# Patient Record
Sex: Male | Born: 1935 | Race: White | Hispanic: No | Marital: Married | Smoking: Former smoker
Health system: Southern US, Community
[De-identification: ages and names within clinical notes are randomized; demographics above are authoritative.]

## PROBLEM LIST (undated history)

## (undated) DIAGNOSIS — G629 Polyneuropathy, unspecified: Secondary | ICD-10-CM

## (undated) DIAGNOSIS — F419 Anxiety disorder, unspecified: Secondary | ICD-10-CM

## (undated) DIAGNOSIS — E1142 Type 2 diabetes mellitus with diabetic polyneuropathy: Secondary | ICD-10-CM

## (undated) DIAGNOSIS — I4819 Other persistent atrial fibrillation: Secondary | ICD-10-CM

## (undated) DIAGNOSIS — C649 Malignant neoplasm of unspecified kidney, except renal pelvis: Secondary | ICD-10-CM

## (undated) DIAGNOSIS — K222 Esophageal obstruction: Secondary | ICD-10-CM

## (undated) DIAGNOSIS — K219 Gastro-esophageal reflux disease without esophagitis: Secondary | ICD-10-CM

## (undated) DIAGNOSIS — E785 Hyperlipidemia, unspecified: Secondary | ICD-10-CM

## (undated) DIAGNOSIS — J449 Chronic obstructive pulmonary disease, unspecified: Secondary | ICD-10-CM

## (undated) DIAGNOSIS — F1020 Alcohol dependence, uncomplicated: Secondary | ICD-10-CM

## (undated) DIAGNOSIS — N2889 Other specified disorders of kidney and ureter: Secondary | ICD-10-CM

## (undated) DIAGNOSIS — C159 Malignant neoplasm of esophagus, unspecified: Secondary | ICD-10-CM

## (undated) DIAGNOSIS — H3530 Unspecified macular degeneration: Secondary | ICD-10-CM

## (undated) DIAGNOSIS — R0600 Dyspnea, unspecified: Secondary | ICD-10-CM

## (undated) DIAGNOSIS — F0390 Unspecified dementia without behavioral disturbance: Secondary | ICD-10-CM

## (undated) HISTORY — PX: BACK SURGERY: SHX140

## (undated) HISTORY — DX: Chronic obstructive pulmonary disease, unspecified: J44.9

## (undated) HISTORY — DX: Gastro-esophageal reflux disease without esophagitis: K21.9

## (undated) HISTORY — DX: Other specified disorders of kidney and ureter: N28.89

## (undated) HISTORY — DX: Anxiety disorder, unspecified: F41.9

## (undated) HISTORY — PX: KNEE SURGERY: SHX244

## (undated) HISTORY — DX: Hyperlipidemia, unspecified: E78.5

## (undated) HISTORY — DX: Type 2 diabetes mellitus with diabetic polyneuropathy: E11.42

## (undated) HISTORY — DX: Unspecified dementia without behavioral disturbance: F03.90

## (undated) HISTORY — PX: CATARACT EXTRACTION: SUR2

## (undated) HISTORY — DX: Alcohol dependence, uncomplicated: F10.20

## (undated) HISTORY — DX: Unspecified macular degeneration: H35.30

## (undated) HISTORY — DX: Malignant neoplasm of esophagus, unspecified: C15.9

---

## 2015-01-22 DIAGNOSIS — R1314 Dysphagia, pharyngoesophageal phase: Secondary | ICD-10-CM | POA: Insufficient documentation

## 2015-01-22 DIAGNOSIS — G4733 Obstructive sleep apnea (adult) (pediatric): Secondary | ICD-10-CM | POA: Insufficient documentation

## 2015-02-01 DIAGNOSIS — C159 Malignant neoplasm of esophagus, unspecified: Secondary | ICD-10-CM | POA: Insufficient documentation

## 2016-02-25 ENCOUNTER — Ambulatory Visit: Payer: Medicare Other | Admitting: Oncology

## 2016-02-25 DIAGNOSIS — C159 Malignant neoplasm of esophagus, unspecified: Secondary | ICD-10-CM | POA: Insufficient documentation

## 2016-02-25 NOTE — Progress Notes (Addendum)
[facility]  [phone] Fax:([phone]) [phone]  ID: Miguel [name] OB: 1929/10/12  MR#: [id number]  VVO#:[id number]  Patient Care Team: Coral Spikes, DO as PCP - General (Family Medicine)  CHIEF COMPLAINT: Stage IIIb esophageal cancer.  INTERVAL HISTORY: Patient is an 82 year old male who was initially diagnosed with a squamous cell esophageal cancer in [place], [place] in October 2016. He was then noted to have progression of disease with metastatic lymphadenopathy and his abdomen and was subsequently treated with low-dose carboplatin and Taxol and then subsequently Ramicirumab and Taxol. His last infusion was on February 07, 2016. Subsequent PET scan revealed significant improvement of disease burden. Patient is now living in [place], [place] and is accompanied by his wife and son at clinic. He currently feels well and is asymptomatic. He denies any dysphagia or difficulty swallowing. He has no neurologic complaints, although admits to worsening memory. He denies any recent fevers or illnesses. He has no chest pain or shortness of breath. He denies any nausea, vomiting, constipation, or diarrhea. He has no urinary complaints. Patient feels at his baseline and offers no specific complaints today.  REVIEW OF SYSTEMS:   Review of Systems  Constitutional: Negative.  Negative for fever, malaise/fatigue and weight loss.  Respiratory: Negative.  Negative for cough and shortness of breath.   Cardiovascular: Negative.  Negative for chest pain and leg swelling.  Gastrointestinal: Negative.  Negative for abdominal pain, heartburn, nausea and vomiting.  Genitourinary: Negative.   Musculoskeletal: Negative.   Neurological: Negative.  Negative for weakness.  Psychiatric/Behavioral: Positive for memory loss. The patient is not nervous/anxious.     As per HPI. Otherwise, a complete review of systems is negative.  PAST MEDICAL HISTORY: Past Medical  History:  Diagnosis Date  . Anxiety   . Emphysema of lung ([place])   . Esophageal cancer ([place])   . Macular degeneration     PAST SURGICAL HISTORY: Past Surgical History:  Procedure Laterality Date  . BACK SURGERY    . KNEE SURGERY      FAMILY HISTORY: Family History  Problem Relation Age of Onset  . Ovarian cancer Sister   . Throat cancer Brother     ADVANCED DIRECTIVES (Y/N):  N  HEALTH MAINTENANCE: Social History  Substance Use Topics  . Smoking status: Former Research scientist (life sciences)  . Smokeless tobacco: Never Used  . Alcohol use No     Colonoscopy:  PAP:  Bone density:  Lipid panel:  No Known Allergies  Current Outpatient Prescriptions  Medication Sig Dispense Refill  . HYDROcodone-acetaminophen (NORCO) 7.5-325 MG tablet Take 1 tablet by mouth every 6 (six) hours as needed for moderate pain.    Marland Kitchen ADVAIR DISKUS 250-50 MCG/DOSE AEPB Inhale 1 puff into the lungs daily.    Marland Kitchen ALPRAZolam (XANAX) 0.5 MG tablet Take 1 tablet by mouth as needed.    . cetirizine (ZYRTEC) 10 MG tablet Take 1 tablet by mouth daily.    Marland Kitchen donepezil (ARICEPT) 10 MG tablet Take 1 tablet by mouth daily.    . fesoterodine (TOVIAZ) 8 MG TB24 tablet Take 1 tablet by mouth daily.    . furosemide (LASIX) 20 MG tablet Take 1 tablet by mouth daily.    Marland Kitchen gabapentin (NEURONTIN) 300 MG capsule Take 1 capsule by mouth daily.    . metFORMIN (GLUCOPHAGE) 1000 MG tablet Take 1 tablet by mouth 2 (two) times daily.    . STOOL SOFTENER 100 MG capsule Take 1 tablet by mouth 2 (two) times  daily.     No current facility-administered medications for this visit.     OBJECTIVE: Vitals:   02/26/16 1123  BP: 118/84  Pulse: 88  Resp: 18  Temp: 97 F (36.1 C)     Body mass index is 34.89 kg/m.    ECOG FS:0 - Asymptomatic  General: Well-developed, well-nourished, no acute distress. Eyes: Pink conjunctiva, anicteric sclera. HEENT: Normocephalic, moist mucous membranes, clear oropharnyx. Lungs: Clear to auscultation  bilaterally. Heart: Regular rate and rhythm. No rubs, murmurs, or gallops. Abdomen: Soft, nontender, nondistended. No organomegaly noted, normoactive bowel sounds. Musculoskeletal: No edema, cyanosis, or clubbing. Neuro: Alert, answering all questions appropriately. Cranial nerves grossly intact. Skin: No rashes or petechiae noted. Psych: Normal affect. Lymphatics: No cervical, calvicular, axillary or inguinal LAD.   LAB RESULTS:  No results found for: NA, K, CL, CO2, GLUCOSE, BUN, CREATININE, CALCIUM, PROT, ALBUMIN, AST, ALT, ALKPHOS, BILITOT, GFRNONAA, GFRAA  No results found for: WBC, NEUTROABS, HGB, HCT, MCV, PLT   STUDIES: No results found.  ONCOLOGY HISTORY: Initial diagnosis was in approximately October 2016. This was followed by concurrent chemotherapy with infusional 5-FU and XRT.  Patient received low-dose weekly carboplatinum and Taxol in approximately April and May 2017. He subsequently had a second recurrence and was initiated on Taxol 40 mg/m on days 1, 8, and 15 (Taxol was dose reduced 50% secondary to neuropathy) along with Ramucirumab 8 mg/m on days 1 and 8 starting September 27, 2015 through January 24, 2016. This was a 28 day cycle.  ASSESSMENT: Stage IIIb esophageal cancer  PLAN:    1. Stage IIIb esophageal cancer: Patient was noted to be HER-2 negative. Please see oncology history listed above. CT scan dated February 11, 2016 revealed mild concentric thickening of the upper esophagus without evidence of discrete mass. This was unchanged from his previous scan on November 19, 2015. There was no evidence of pulmonary metastatic disease and the right supraclavicular node that was previously positive was no longer appreciated. He had no evidence of gastrohepatic ligament adenopathy, abdominal or pelvic adenopathy. No evidence of hepatic disease. Will continue simple observation and repeat CT scan near the end of January 2018. Patient will then return to clinic 1-2 days later for  further evaluation. If patient has recurrent disease, would reinitiate dose reduced Taxol and Ramucirumab. 2. Neuropathy: Continue gabapentin and hydrocodone as prescribed. 3. Esophageal stricture: Patient has a history of multiple esophageal dilations. Will refer to GI in the future if necessary.  Approximately 45 minutes was spent in discussion of which greater than 50% was consultation.  Patient expressed understanding and was in agreement with this plan. He also understands that He can call clinic at any time with any questions, concerns, or complaints.   Squamous cell esophageal cancer ([place])   Staging form: Esophagus - Squamous Cell Carcinoma, AJCC 7th Edition   - Clinical stage from 02/27/2016: Stage IIIB (T3, N2, M0) - Signed by Lloyd Huger, MD on 02/27/2016  Lloyd Huger, MD   02/27/2016 2:04 PM

## 2016-02-26 ENCOUNTER — Inpatient Hospital Stay: Payer: Medicare Other | Attending: Oncology | Admitting: Oncology

## 2016-02-26 ENCOUNTER — Encounter: Payer: Self-pay | Admitting: Oncology

## 2016-02-26 VITALS — BP 118/84 | HR 88 | Temp 97.0°F | Resp 18 | Ht 72.0 in | Wt 257.3 lb

## 2016-02-26 DIAGNOSIS — H3530 Unspecified macular degeneration: Secondary | ICD-10-CM | POA: Insufficient documentation

## 2016-02-26 DIAGNOSIS — Z79899 Other long term (current) drug therapy: Secondary | ICD-10-CM | POA: Insufficient documentation

## 2016-02-26 DIAGNOSIS — Z8501 Personal history of malignant neoplasm of esophagus: Secondary | ICD-10-CM | POA: Insufficient documentation

## 2016-02-26 DIAGNOSIS — Z7984 Long term (current) use of oral hypoglycemic drugs: Secondary | ICD-10-CM | POA: Insufficient documentation

## 2016-02-26 DIAGNOSIS — Z923 Personal history of irradiation: Secondary | ICD-10-CM | POA: Diagnosis not present

## 2016-02-26 DIAGNOSIS — Z87891 Personal history of nicotine dependence: Secondary | ICD-10-CM | POA: Insufficient documentation

## 2016-02-26 DIAGNOSIS — C159 Malignant neoplasm of esophagus, unspecified: Secondary | ICD-10-CM | POA: Insufficient documentation

## 2016-02-26 DIAGNOSIS — J439 Emphysema, unspecified: Secondary | ICD-10-CM | POA: Insufficient documentation

## 2016-02-26 DIAGNOSIS — Z8041 Family history of malignant neoplasm of ovary: Secondary | ICD-10-CM | POA: Diagnosis not present

## 2016-02-26 DIAGNOSIS — Z9221 Personal history of antineoplastic chemotherapy: Secondary | ICD-10-CM | POA: Insufficient documentation

## 2016-02-26 DIAGNOSIS — Z800 Family history of malignant neoplasm of digestive organs: Secondary | ICD-10-CM | POA: Insufficient documentation

## 2016-02-26 DIAGNOSIS — C772 Secondary and unspecified malignant neoplasm of intra-abdominal lymph nodes: Secondary | ICD-10-CM | POA: Insufficient documentation

## 2016-02-26 DIAGNOSIS — F419 Anxiety disorder, unspecified: Secondary | ICD-10-CM | POA: Insufficient documentation

## 2016-02-26 DIAGNOSIS — G629 Polyneuropathy, unspecified: Secondary | ICD-10-CM | POA: Insufficient documentation

## 2016-02-26 NOTE — Progress Notes (Signed)
Transfer of care for esophageal cancer. Pt had CT scan recently which looked good per the pt's son. Pt is currently on break from chemo. Complains of neuropathy in bilateral lower ext. Per pt's son, the pt has had decline in mental status since last chemo.

## 2016-02-27 ENCOUNTER — Ambulatory Visit
Admission: RE | Admit: 2016-02-27 | Discharge: 2016-02-27 | Disposition: A | Discharge status: Home / Self Care | Payer: Self-pay | Source: Ambulatory Visit | Attending: Oncology | Admitting: Oncology

## 2016-02-27 ENCOUNTER — Other Ambulatory Visit: Payer: Self-pay | Admitting: Oncology

## 2016-02-27 DIAGNOSIS — C159 Malignant neoplasm of esophagus, unspecified: Secondary | ICD-10-CM

## 2016-02-28 ENCOUNTER — Telehealth: Payer: Self-pay | Admitting: *Deleted

## 2016-02-28 NOTE — Telephone Encounter (Signed)
-----   Message from Manus Rudd, RN sent at 02/25/2016  2:55 PM EST ----- Regarding: Call back Patient's son called and asked that you please call him back. 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 If not please forward this to the correct person.

## 2016-02-28 NOTE — Telephone Encounter (Signed)
Son just had questions about the appt but they had just left out and the appt went well and pt not on chemo right now- hoping that dad memory gets better and the scan from his previous provider befroe the move done on 10/31 had drastically shrunk tumor.

## 2016-03-18 ENCOUNTER — Ambulatory Visit (INDEPENDENT_AMBULATORY_CARE_PROVIDER_SITE_OTHER): Payer: Medicare Other | Admitting: Family Medicine

## 2016-03-18 ENCOUNTER — Encounter: Payer: Self-pay | Admitting: Family Medicine

## 2016-03-18 VITALS — BP 92/59 | HR 85 | Resp 16 | Ht 69.0 in | Wt 254.2 lb

## 2016-03-18 DIAGNOSIS — F0390 Unspecified dementia without behavioral disturbance: Secondary | ICD-10-CM | POA: Diagnosis not present

## 2016-03-18 DIAGNOSIS — E1142 Type 2 diabetes mellitus with diabetic polyneuropathy: Secondary | ICD-10-CM

## 2016-03-18 DIAGNOSIS — C159 Malignant neoplasm of esophagus, unspecified: Secondary | ICD-10-CM | POA: Diagnosis not present

## 2016-03-18 DIAGNOSIS — J449 Chronic obstructive pulmonary disease, unspecified: Secondary | ICD-10-CM

## 2016-03-18 DIAGNOSIS — F419 Anxiety disorder, unspecified: Secondary | ICD-10-CM | POA: Insufficient documentation

## 2016-03-18 DIAGNOSIS — K219 Gastro-esophageal reflux disease without esophagitis: Secondary | ICD-10-CM

## 2016-03-18 DIAGNOSIS — L304 Erythema intertrigo: Secondary | ICD-10-CM | POA: Insufficient documentation

## 2016-03-18 DIAGNOSIS — H3530 Unspecified macular degeneration: Secondary | ICD-10-CM | POA: Insufficient documentation

## 2016-03-18 HISTORY — DX: Gastro-esophageal reflux disease without esophagitis: K21.9

## 2016-03-18 HISTORY — DX: Type 2 diabetes mellitus with diabetic polyneuropathy: E11.42

## 2016-03-18 HISTORY — DX: Unspecified dementia, unspecified severity, without behavioral disturbance, psychotic disturbance, mood disturbance, and anxiety: F03.90

## 2016-03-18 LAB — COMPREHENSIVE METABOLIC PANEL
ALBUMIN: 3.7 g/dL (ref 3.5–5.2)
ALT: 10 U/L (ref 0–53)
AST: 19 U/L (ref 0–37)
Alkaline Phosphatase: 76 U/L (ref 39–117)
BILIRUBIN TOTAL: 0.9 mg/dL (ref 0.2–1.2)
BUN: 23 mg/dL (ref 6–23)
CALCIUM: 9.1 mg/dL (ref 8.4–10.5)
CO2: 34 meq/L — AB (ref 19–32)
CREATININE: 1.65 mg/dL — AB (ref 0.40–1.50)
Chloride: 102 mEq/L (ref 96–112)
GFR: 42.41 mL/min — ABNORMAL LOW (ref >60.00)
Glucose, Bld: 87 mg/dL (ref 70–99)
Potassium: 4.7 mEq/L (ref 3.5–5.1)
Sodium: 142 mEq/L (ref 135–145)
Total Protein: 6.2 g/dL (ref 6.0–8.3)

## 2016-03-18 LAB — CBC
HCT: 42.6 % (ref 39.0–52.0)
HEMOGLOBIN: 14.3 g/dL (ref 13.0–17.0)
MCHC: 33.5 g/dL (ref 30.0–36.0)
MCV: 98.7 fl (ref 78.0–100.0)
PLATELETS: 135 10*3/uL — AB (ref 150.0–400.0)
RBC: 4.31 Mil/uL (ref 4.22–5.81)
RDW: 18.2 % — ABNORMAL HIGH (ref 11.5–15.5)
WBC: 5 10*3/uL (ref 4.0–10.5)

## 2016-03-18 LAB — LIPID PANEL
CHOLESTEROL: 203 mg/dL — AB (ref 0–200)
HDL: 48.4 mg/dL (ref >39.00)
NONHDL: 154.46
TRIGLYCERIDES: 204 mg/dL — AB (ref 0.0–149.0)
Total CHOL/HDL Ratio: 4
VLDL: 40.8 mg/dL — ABNORMAL HIGH (ref 0.0–40.0)

## 2016-03-18 LAB — HEMOGLOBIN A1C: Hgb A1c MFr Bld: 5.4 % (ref 4.6–6.5)

## 2016-03-18 LAB — LDL CHOLESTEROL, DIRECT: Direct LDL: 108 mg/dL

## 2016-03-18 MED ORDER — OMEPRAZOLE 20 MG PO CPDR: 20.0000 mg | DELAYED_RELEASE_CAPSULE | Freq: Two times a day (BID) | ORAL | 3 refills | Status: DC

## 2016-03-18 MED ORDER — CLOTRIMAZOLE 1 % EX CREA: 1.0000 "application " | TOPICAL_CREAM | Freq: Two times a day (BID) | CUTANEOUS | 3 refills | Status: DC

## 2016-03-18 NOTE — Progress Notes (Signed)
Subjective:  Patient ID: Miguel [name], male    DOB: 01-04-1930  Age: 82 y.o. MRN: [id number]  CC: Establish care - Rash, Memory difficulty, GERD  HPI Miguel [name] is a 82 y.o. male presents to the clinic today to establish care Issues are below.  COPD  Stable on Advair.  DM-2  Well controlled.  Sugar 147 this am.  Needs A1C today.  Is currently taking metformin 1000 mg infrequently.  GERD  Stable on Omeprazole 20 mg BID.  Needs refill.  Memory difficulty   Wife and son concerned about ongoing memory difficulties.  Has been on Aricept for "years".  Trouble with numbers and getting words out at times.  No behavior change or confusion.  Rash  Patient has had a rash under abdominal pannus for the past 1 week.  History of this prior.  His wife has been applying a topical antifungal with improvement.  PMH, Surgical Hx, Family Hx, Social History reviewed and updated as below.  Past Medical History:  Diagnosis Date  . Alcoholism ([place])   . Anxiety   . COPD (chronic obstructive pulmonary disease) ([place])   . Dementia 03/18/2016  . DM type 2 with diabetic peripheral neuropathy ([place]) 03/18/2016  . Esophageal cancer ([place])   . GERD (gastroesophageal reflux disease) 03/18/2016  . Hyperlipidemia   . Macular degeneration    Past Surgical History:  Procedure Laterality Date  . BACK SURGERY    . CATARACT EXTRACTION    . KNEE SURGERY     Family History  Problem Relation Age of Onset  . Ovarian cancer Sister   . Throat cancer Brother   . Diabetes Mother   . Kidney disease Father    Social History  Substance Use Topics  . Smoking status: Former Research scientist (life sciences)  . Smokeless tobacco: Never Used  . Alcohol use No    Review of Systems  Cardiovascular: Positive for leg swelling.  Gastrointestinal: Positive for diarrhea.  Musculoskeletal: Positive for arthralgias.  Skin: Positive for rash.  Neurological: Positive for dizziness.       Memory difficulty.  All  other systems reviewed and are negative.  Objective:   Today's Vitals: BP (!) 92/59 (BP Location: Left Arm, Patient Position: Sitting, Cuff Size: Large)   Pulse 85   Resp 16   Ht 5\' 9"  (1.753 m)   Wt 254 lb 4 oz (115.3 kg)   SpO2 98%   BMI 37.55 kg/m   Physical Exam  Constitutional:  Chronically ill appearing male in NAD.   HENT:  Head: Normocephalic and atraumatic.  Eyes:  Left eye with severe erythema (has injection recently).  Neck: Neck supple.  Cardiovascular: Normal rate and regular rhythm.   1+ LE edema.   Pulmonary/Chest: Effort normal and breath sounds normal.  Abdominal: Soft. He exhibits no distension. There is no tenderness. There is no rebound and no guarding.  Neurological: He is alert.  Skin:  Abdominal pannus - no erythema or maceration.  Psychiatric: He has a normal mood and affect.  Vitals reviewed.  Assessment & Plan:   Problem List Items Addressed This Visit    Squamous cell esophageal cancer ([place])   Relevant Orders   CBC (Completed)   Intertrigo    Appears well at this time. Clotrimazole if needed.       GERD (gastroesophageal reflux disease)    Stable. Continue omeprazole.  Refilled today.      Relevant Medications   omeprazole (PRILOSEC) 20 MG capsule  DM type 2 with diabetic peripheral neuropathy (H[facility])    Well controlled. A1c 5.4.      Relevant Orders   Hemoglobin A1c (Completed)   Comprehensive metabolic panel (Completed)   Lipid panel (Completed)   Dementia    ? Worsening. Discussed discontinuation of Aricept. Patient to consider.       COPD (chronic obstructive pulmonary disease) (H[facility]) - Primary    Stable on Advair. Continue.         Outpatient Encounter Prescriptions as of 03/18/2016  Medication Sig  . ADVAIR DISKUS 250-50 MCG/DOSE AEPB Inhale 1 puff into the lungs daily.  Marland Kitchen ALPRAZolam (XANAX) 0.5 MG tablet Take 1 tablet by mouth as needed.  . cetirizine (ZYRTEC) 10 MG tablet Take 1 tablet by mouth daily.  Marland Kitchen  donepezil (ARICEPT) 10 MG tablet Take 1 tablet by mouth daily.  . fesoterodine (TOVIAZ) 8 MG TB24 tablet Take 1 tablet by mouth daily.  . furosemide (LASIX) 20 MG tablet Take 1 tablet by mouth daily.  Marland Kitchen gabapentin (NEURONTIN) 300 MG capsule Take 1 capsule by mouth daily.  Marland Kitchen HYDROcodone-acetaminophen (NORCO) 7.5-325 MG tablet Take 1 tablet by mouth every 6 (six) hours as needed for moderate pain.  . metFORMIN (GLUCOPHAGE) 1000 MG tablet Take 1 tablet by mouth 2 (two) times daily.  . STOOL SOFTENER 100 MG capsule Take 1 tablet by mouth 2 (two) times daily.  . clotrimazole (CLOTRIMAZOLE AF) 1 % cream Apply 1 application topically 2 (two) times daily.  Marland Kitchen omeprazole (PRILOSEC) 20 MG capsule Take 1 capsule (20 mg total) by mouth 2 (two) times daily before a meal.   No facility-administered encounter medications on file as of 03/18/2016.     Follow-up: Return in about 3 months (around 06/16/2016).  [place]

## 2016-03-18 NOTE — Assessment & Plan Note (Signed)
Stable.  Continue omeprazole.  Refilled today. 

## 2016-03-18 NOTE — Patient Instructions (Signed)
Continue your medications.  I refilled the prilosec.  Follow up in 3 months.  We will arrange referral to podiatry.  Take care  Dr. Lacinda Axon

## 2016-03-18 NOTE — Assessment & Plan Note (Signed)
Appears well at this time. Clotrimazole if needed.

## 2016-03-18 NOTE — Assessment & Plan Note (Signed)
Well controlled. A1c 5.4.

## 2016-03-18 NOTE — Assessment & Plan Note (Signed)
Stable on Advair. Continue.

## 2016-03-18 NOTE — Assessment & Plan Note (Signed)
?   Worsening. Discussed discontinuation of Aricept. Patient to consider.

## 2016-03-20 ENCOUNTER — Other Ambulatory Visit: Payer: Self-pay | Admitting: Family Medicine

## 2016-03-20 MED ORDER — ATORVASTATIN CALCIUM 40 MG PO TABS: 40.0000 mg | ORAL_TABLET | Freq: Every day | ORAL | 3 refills | Status: DC

## 2016-03-24 ENCOUNTER — Other Ambulatory Visit: Payer: Self-pay | Admitting: Family Medicine

## 2016-03-24 ENCOUNTER — Telehealth: Payer: Self-pay | Admitting: Family Medicine

## 2016-03-24 DIAGNOSIS — E1142 Type 2 diabetes mellitus with diabetic polyneuropathy: Secondary | ICD-10-CM

## 2016-03-24 NOTE — Telephone Encounter (Signed)
Done

## 2016-03-24 NOTE — Telephone Encounter (Signed)
Pt so called regarding his father referral for podiatry. This was not in the system. Did you place order?

## 2016-03-25 ENCOUNTER — Other Ambulatory Visit: Payer: Self-pay | Admitting: Family Medicine

## 2016-03-25 MED ORDER — KETOCONAZOLE 2 % EX CREA: 1.0000 "application " | TOPICAL_CREAM | Freq: Every day | CUTANEOUS | 1 refills | Status: DC

## 2016-04-16 ENCOUNTER — Ambulatory Visit (INDEPENDENT_AMBULATORY_CARE_PROVIDER_SITE_OTHER): Payer: Medicare Other

## 2016-04-16 ENCOUNTER — Ambulatory Visit (INDEPENDENT_AMBULATORY_CARE_PROVIDER_SITE_OTHER): Payer: Medicare Other | Admitting: Podiatry

## 2016-04-16 ENCOUNTER — Encounter: Payer: Self-pay | Admitting: Podiatry

## 2016-04-16 VITALS — BP 117/74 | HR 91 | Resp 16

## 2016-04-16 DIAGNOSIS — E1142 Type 2 diabetes mellitus with diabetic polyneuropathy: Secondary | ICD-10-CM

## 2016-04-16 DIAGNOSIS — M79676 Pain in unspecified toe(s): Secondary | ICD-10-CM | POA: Diagnosis not present

## 2016-04-16 DIAGNOSIS — B351 Tinea unguium: Secondary | ICD-10-CM | POA: Diagnosis not present

## 2016-04-16 NOTE — Progress Notes (Signed)
   Subjective:    Patient ID: Miguel [name], male    DOB: 09/30/1929, 83 y.o.   MRN: [id number]  HPI: He presents today with his wife with the history of diabetes he also has a history of esophageal cancer and chemotherapy. States that his last hemoglobin A1c was a 5.4. He has some neuropathy in his feet and hands. He just needs help having his nails cut.  Review of Systems  Constitutional: Positive for fatigue.  HENT: Positive for hearing loss.   Respiratory: Positive for shortness of breath.   Cardiovascular: Positive for leg swelling.  Musculoskeletal: Positive for myalgias.  Hematological: Bruises/bleeds easily.  Psychiatric/Behavioral: Positive for confusion.  All other systems reviewed and are negative.      Objective:   Physical Exam: Vital signs are stable alert and oriented 3. Pulses are palpable. Neurologic sensorium is intact. Deep tendon reflexes are intact. Muscle strength is normal bilateral. Orthopedic evaluation was resolved also slightly full range of motion or crepitus considerable edema pitting in nature bilateral lower extremities. This has mild hammertoe deformities noted bilateral. Cutaneous evaluation demonstrates thick yellow dystrophic onychomycotic nails and edema no open lesions or wounds.        Assessment & Plan:  Assessment: Diabetic peripheral neuropathy neuropathy possibly associated with chemotherapeutic agents.  Plan: Debrided toenails 1 through 5 bilateral. He will continue his gabapentin. Follow-up with Dr. Stanton Kidney in 3 months.

## 2016-04-16 NOTE — Patient Instructions (Signed)

## 2016-04-17 ENCOUNTER — Other Ambulatory Visit: Payer: Self-pay | Admitting: Family Medicine

## 2016-04-17 MED ORDER — FUROSEMIDE 20 MG PO TABS: 20.0000 mg | ORAL_TABLET | Freq: Every day | ORAL | 3 refills | Status: DC

## 2016-04-17 MED ORDER — GLUCOSE BLOOD VI STRP: ORAL_STRIP | 12 refills | Status: DC

## 2016-04-17 NOTE — Telephone Encounter (Signed)
Historical medication. Pt last seen 03/18/16. Please advise?

## 2016-04-24 ENCOUNTER — Other Ambulatory Visit: Payer: Self-pay | Admitting: Family Medicine

## 2016-04-24 MED ORDER — FESOTERODINE FUMARATE ER 8 MG PO TB24: 8.0000 mg | ORAL_TABLET | Freq: Every day | ORAL | 0 refills | Status: DC

## 2016-04-24 NOTE — Telephone Encounter (Signed)
Pt son was made aware of medication being sent.

## 2016-04-24 NOTE — Telephone Encounter (Signed)
Historical medication. Pt last seen 03/18/16. Please advise?

## 2016-05-02 ENCOUNTER — Inpatient Hospital Stay: Payer: Medicare Other | Attending: Oncology

## 2016-05-02 DIAGNOSIS — Z800 Family history of malignant neoplasm of digestive organs: Secondary | ICD-10-CM | POA: Insufficient documentation

## 2016-05-02 DIAGNOSIS — I700 Atherosclerosis of aorta: Secondary | ICD-10-CM | POA: Diagnosis not present

## 2016-05-02 DIAGNOSIS — G629 Polyneuropathy, unspecified: Secondary | ICD-10-CM | POA: Insufficient documentation

## 2016-05-02 DIAGNOSIS — N2889 Other specified disorders of kidney and ureter: Secondary | ICD-10-CM | POA: Diagnosis not present

## 2016-05-02 DIAGNOSIS — Z9221 Personal history of antineoplastic chemotherapy: Secondary | ICD-10-CM | POA: Diagnosis not present

## 2016-05-02 DIAGNOSIS — J449 Chronic obstructive pulmonary disease, unspecified: Secondary | ICD-10-CM | POA: Diagnosis not present

## 2016-05-02 DIAGNOSIS — Z8041 Family history of malignant neoplasm of ovary: Secondary | ICD-10-CM | POA: Diagnosis not present

## 2016-05-02 DIAGNOSIS — E785 Hyperlipidemia, unspecified: Secondary | ICD-10-CM | POA: Insufficient documentation

## 2016-05-02 DIAGNOSIS — Z7984 Long term (current) use of oral hypoglycemic drugs: Secondary | ICD-10-CM | POA: Diagnosis not present

## 2016-05-02 DIAGNOSIS — Z8501 Personal history of malignant neoplasm of esophagus: Secondary | ICD-10-CM | POA: Insufficient documentation

## 2016-05-02 DIAGNOSIS — K219 Gastro-esophageal reflux disease without esophagitis: Secondary | ICD-10-CM | POA: Diagnosis not present

## 2016-05-02 DIAGNOSIS — Z79899 Other long term (current) drug therapy: Secondary | ICD-10-CM | POA: Insufficient documentation

## 2016-05-02 DIAGNOSIS — F0390 Unspecified dementia without behavioral disturbance: Secondary | ICD-10-CM | POA: Insufficient documentation

## 2016-05-02 DIAGNOSIS — Z95828 Presence of other vascular implants and grafts: Secondary | ICD-10-CM

## 2016-05-02 DIAGNOSIS — Z87891 Personal history of nicotine dependence: Secondary | ICD-10-CM | POA: Insufficient documentation

## 2016-05-02 DIAGNOSIS — H3530 Unspecified macular degeneration: Secondary | ICD-10-CM | POA: Insufficient documentation

## 2016-05-02 DIAGNOSIS — Z452 Encounter for adjustment and management of vascular access device: Secondary | ICD-10-CM | POA: Diagnosis not present

## 2016-05-02 MED ORDER — HEPARIN SOD (PORK) LOCK FLUSH 100 UNIT/ML IV SOLN
500.0000 [IU] | Freq: Once | INTRAVENOUS | Status: AC
  Administered 2016-05-02: 500 [IU] via INTRAVENOUS

## 2016-05-02 MED ORDER — SODIUM CHLORIDE 0.9% FLUSH
10.0000 mL | INTRAVENOUS | Status: DC | PRN
  Administered 2016-05-02: 10 mL via INTRAVENOUS
  Filled 2016-05-02: qty 10

## 2016-05-12 ENCOUNTER — Telehealth: Payer: Self-pay | Admitting: *Deleted

## 2016-05-12 ENCOUNTER — Inpatient Hospital Stay: Payer: Medicare Other

## 2016-05-12 ENCOUNTER — Ambulatory Visit
Admission: RE | Admit: 2016-05-12 | Discharge: 2016-05-12 | Disposition: A | Discharge status: Home / Self Care | Payer: Medicare Other | Source: Ambulatory Visit | Attending: Oncology | Admitting: Oncology

## 2016-05-12 DIAGNOSIS — N2889 Other specified disorders of kidney and ureter: Secondary | ICD-10-CM | POA: Insufficient documentation

## 2016-05-12 DIAGNOSIS — J9811 Atelectasis: Secondary | ICD-10-CM | POA: Insufficient documentation

## 2016-05-12 DIAGNOSIS — N281 Cyst of kidney, acquired: Secondary | ICD-10-CM | POA: Diagnosis not present

## 2016-05-12 DIAGNOSIS — C159 Malignant neoplasm of esophagus, unspecified: Secondary | ICD-10-CM

## 2016-05-12 DIAGNOSIS — M2578 Osteophyte, vertebrae: Secondary | ICD-10-CM | POA: Diagnosis not present

## 2016-05-12 DIAGNOSIS — J432 Centrilobular emphysema: Secondary | ICD-10-CM | POA: Diagnosis not present

## 2016-05-12 DIAGNOSIS — I700 Atherosclerosis of aorta: Secondary | ICD-10-CM | POA: Insufficient documentation

## 2016-05-12 DIAGNOSIS — I2510 Atherosclerotic heart disease of native coronary artery without angina pectoris: Secondary | ICD-10-CM | POA: Diagnosis not present

## 2016-05-12 DIAGNOSIS — R918 Other nonspecific abnormal finding of lung field: Secondary | ICD-10-CM | POA: Diagnosis not present

## 2016-05-12 DIAGNOSIS — Z8501 Personal history of malignant neoplasm of esophagus: Secondary | ICD-10-CM | POA: Diagnosis not present

## 2016-05-12 DIAGNOSIS — K4020 Bilateral inguinal hernia, without obstruction or gangrene, not specified as recurrent: Secondary | ICD-10-CM | POA: Insufficient documentation

## 2016-05-12 DIAGNOSIS — K8020 Calculus of gallbladder without cholecystitis without obstruction: Secondary | ICD-10-CM | POA: Diagnosis not present

## 2016-05-12 LAB — CBC WITH DIFFERENTIAL/PLATELET
BASOS ABS: 0 10*3/uL (ref 0–0.1)
Basophils Relative: 1 %
EOS ABS: 0.1 10*3/uL (ref 0–0.7)
EOS PCT: 3 %
HCT: 43.3 % (ref 40.0–52.0)
HEMOGLOBIN: 14.6 g/dL (ref 13.0–18.0)
LYMPHS ABS: 0.9 10*3/uL — AB (ref 1.0–3.6)
LYMPHS PCT: 17 %
MCH: 32.3 pg (ref 26.0–34.0)
MCHC: 33.7 g/dL (ref 32.0–36.0)
MCV: 95.7 fL (ref 80.0–100.0)
Monocytes Absolute: 0.5 10*3/uL (ref 0.2–1.0)
Monocytes Relative: 9 %
NEUTROS PCT: 70 %
Neutro Abs: 3.6 10*3/uL (ref 1.4–6.5)
PLATELETS: 158 10*3/uL (ref 150–440)
RBC: 4.53 MIL/uL (ref 4.40–5.90)
RDW: 15 % — ABNORMAL HIGH (ref 11.5–14.5)
WBC: 5.1 10*3/uL (ref 3.8–10.6)

## 2016-05-12 LAB — COMPREHENSIVE METABOLIC PANEL
ALT: 17 U/L (ref 17–63)
AST: 28 U/L (ref 15–41)
Albumin: 3.7 g/dL (ref 3.5–5.0)
Alkaline Phosphatase: 91 U/L (ref 38–126)
Anion gap: 6 (ref 5–15)
BILIRUBIN TOTAL: 0.7 mg/dL (ref 0.3–1.2)
BUN: 21 mg/dL — AB (ref 6–20)
CHLORIDE: 101 mmol/L (ref 101–111)
CO2: 29 mmol/L (ref 22–32)
CREATININE: 1.44 mg/dL — AB (ref 0.61–1.24)
Calcium: 9.1 mg/dL (ref 8.9–10.3)
GFR, EST AFRICAN AMERICAN: 50 mL/min — AB (ref >60)
GFR, EST NON AFRICAN AMERICAN: 43 mL/min — AB (ref >60)
Glucose, Bld: 104 mg/dL — ABNORMAL HIGH (ref 65–99)
POTASSIUM: 4.4 mmol/L (ref 3.5–5.1)
Sodium: 136 mmol/L (ref 135–145)
TOTAL PROTEIN: 6.7 g/dL (ref 6.5–8.1)

## 2016-05-12 MED ORDER — IOPAMIDOL (ISOVUE-300) INJECTION 61%
80.0000 mL | Freq: Once | INTRAVENOUS | Status: AC | PRN
  Administered 2016-05-12: 80 mL via INTRAVENOUS

## 2016-05-12 NOTE — Progress Notes (Signed)
[place]  [phone]) [phone] [phone]) [phone]  ID: Efraim Kaufmann OB: [birth year]  MR#: [id number]  TIR#:[id number]  Patient Care Team: Coral Spikes, DO as PCP - General (Family Medicine)  CHIEF COMPLAINT: Stage IIIb esophageal cancer, right renal mass.  INTERVAL HISTORY: Patient returns to clinic today for further evaluation and discussion of his restaging imaging results. He continues to feel well and is asymptomatic. He denies any dysphagia or difficulty swallowing. He has no neurologic complaints, although admits to worsening memory. He denies any recent fevers or illnesses. He has no chest pain or shortness of breath. He denies any nausea, vomiting, constipation, or diarrhea. He has no urinary complaints. Patient offers no specific complaints today.  REVIEW OF SYSTEMS:   Review of Systems  Constitutional: Negative.  Negative for fever, malaise/fatigue and weight loss.  Respiratory: Negative.  Negative for cough and shortness of breath.   Cardiovascular: Negative.  Negative for chest pain and leg swelling.  Gastrointestinal: Negative.  Negative for abdominal pain, heartburn, nausea and vomiting.  Genitourinary: Negative.   Musculoskeletal: Negative.   Neurological: Negative.  Negative for weakness.  Psychiatric/Behavioral: Positive for memory loss. The patient is not nervous/anxious.     As per HPI. Otherwise, a complete review of systems is negative.  PAST MEDICAL HISTORY: Past Medical History:  Diagnosis Date  . Alcoholism ([place])   . Anxiety   . COPD (chronic obstructive pulmonary disease) ([place])   . Dementia 03/18/2016  . DM type 2 with diabetic peripheral neuropathy ([place]) 03/18/2016  . Esophageal cancer ([place])   . GERD (gastroesophageal reflux disease) 03/18/2016  . Hyperlipidemia   . Macular degeneration     PAST SURGICAL HISTORY: Past Surgical History:  Procedure Laterality Date  . BACK SURGERY    . CATARACT EXTRACTION    . KNEE SURGERY       FAMILY HISTORY: Family History  Problem Relation Age of Onset  . Ovarian cancer Sister   . Throat cancer Brother   . Diabetes Mother   . Kidney disease Father     ADVANCED DIRECTIVES (Y/N):  N  HEALTH MAINTENANCE: Social History  Substance Use Topics  . Smoking status: Former Research scientist (life sciences)  . Smokeless tobacco: Never Used  . Alcohol use No     Colonoscopy:  PAP:  Bone density:  Lipid panel:  No Known Allergies  Current Outpatient Prescriptions  Medication Sig Dispense Refill  . ADVAIR DISKUS 250-50 MCG/DOSE AEPB Inhale 1 puff into the lungs daily.    Marland Kitchen ALPRAZolam (XANAX) 0.5 MG tablet Take 1 tablet by mouth as needed.    . clotrimazole (CLOTRIMAZOLE AF) 1 % cream Apply 1 application topically 2 (two) times daily. 60 g 3  . donepezil (ARICEPT) 10 MG tablet Take 1 tablet by mouth daily.    . fesoterodine (TOVIAZ) 8 MG TB24 tablet Take 1 tablet (8 mg total) by mouth daily. 90 tablet 0  . furosemide (LASIX) 20 MG tablet Take 1 tablet (20 mg total) by mouth daily. 30 tablet 3  . gabapentin (NEURONTIN) 300 MG capsule Take 1 capsule by mouth 2 (two) times daily.     Marland Kitchen glucose blood test strip Use as instructed 100 each 12  . ketoconazole (NIZORAL) 2 % cream Apply 1 application topically daily. 60 g 1  . metFORMIN (GLUCOPHAGE) 1000 MG tablet Take 1 tablet by mouth 2 (two) times daily.    Marland Kitchen omeprazole (PRILOSEC) 20 MG capsule Take 1 capsule (20 mg total) by mouth 2 (two) times  daily before a meal. 180 capsule 3  . STOOL SOFTENER 100 MG capsule Take 1 tablet by mouth 2 (two) times daily.    Marland Kitchen atorvastatin (LIPITOR) 40 MG tablet Take 1 tablet (40 mg total) by mouth daily. (Patient not taking: Reported on 05/14/2016) 90 tablet 3  . cetirizine (ZYRTEC) 10 MG tablet Take 1 tablet by mouth daily.    Marland Kitchen HYDROcodone-acetaminophen (NORCO) 7.5-325 MG tablet Take 1 tablet by mouth every 6 (six) hours as needed for moderate pain.     No current facility-administered medications for this visit.      OBJECTIVE: Vitals:   05/14/16 1127  BP: 111/71  Pulse: 84  Resp: 18  Temp: 98.5 F (36.9 C)     Body mass index is 36.98 kg/m.    ECOG FS:0 - Asymptomatic  General: Well-developed, well-nourished, no acute distress. Eyes: Pink conjunctiva, anicteric sclera. HEENT: Normocephalic, moist mucous membranes, clear oropharnyx. Lungs: Clear to auscultation bilaterally. Heart: Regular rate and rhythm. No rubs, murmurs, or gallops. Abdomen: Soft, nontender, nondistended. No organomegaly noted, normoactive bowel sounds. Musculoskeletal: No edema, cyanosis, or clubbing. Neuro: Alert, answering all questions appropriately. Cranial nerves grossly intact. Skin: No rashes or petechiae noted. Psych: Normal affect.  LAB RESULTS:  Lab Results  Component Value Date   NA 136 05/12/2016   K 4.4 05/12/2016   CL 101 05/12/2016   CO2 29 05/12/2016   GLUCOSE 104 (H) 05/12/2016   BUN 21 (H) 05/12/2016   CREATININE 1.44 (H) 05/12/2016   CALCIUM 9.1 05/12/2016   PROT 6.7 05/12/2016   ALBUMIN 3.7 05/12/2016   AST 28 05/12/2016   ALT 17 05/12/2016   ALKPHOS 91 05/12/2016   BILITOT 0.7 05/12/2016   GFRNONAA 43 (L) 05/12/2016   GFRAA 50 (L) 05/12/2016    Lab Results  Component Value Date   WBC 5.1 05/12/2016   NEUTROABS 3.6 05/12/2016   HGB 14.6 05/12/2016   HCT 43.3 05/12/2016   MCV 95.7 05/12/2016   PLT 158 05/12/2016     STUDIES: Ct Chest W Contrast  Result Date: 05/12/2016 CLINICAL DATA:  Esophageal cancer. EXAM: CT CHEST, ABDOMEN, AND PELVIS WITH CONTRAST TECHNIQUE: Multidetector CT imaging of the chest, abdomen and pelvis was performed following the standard protocol during bolus administration of intravenous contrast. CONTRAST:  68m ISOVUE-300 IOPAMIDOL (ISOVUE-300) INJECTION 61% COMPARISON:  02/11/2016. FINDINGS: CT CHEST FINDINGS Cardiovascular: Right IJ Port-A-Cath terminates in the right atrium. Atherosclerotic calcification of the arterial vasculature, including coronary  arteries. Heart is at the upper limits of normal in size to mildly enlarged. No pericardial effusion. Mediastinum/Nodes: No pathologically enlarged mediastinal, hilar or axillary lymph nodes. Esophageal wall may be slightly thickened. The esophagus is dilated and air-filled proximally. Lungs/Pleura: Mild centrilobular emphysema. Small area of subpleural airspace consolidation in the medial right upper lobe is grossly stable. Mild dependent atelectasis in the left lower lobe. Lungs are otherwise clear. Airway is unremarkable. Musculoskeletal: Flowing anterior osteophytosis in the thoracic spine. No worrisome lytic or sclerotic lesions. Old right rib fractures. CT ABDOMEN PELVIS FINDINGS Hepatobiliary: Liver is unremarkable. Numerous stones are seen in the gallbladder. No biliary ductal dilatation. Pancreas: Negative. Spleen: Negative. Adrenals/Urinary Tract: Adrenal glands are unremarkable. A heterogeneous 3.2 cm mass is seen in the lower pole right kidney. Additional low-attenuation lesions in the kidneys measure up to 3.8 cm and 23 Hounsfield units on the left, difficult to characterize as simple cysts. Ureters are decompressed. Presumed TURP defect. Bladder is otherwise grossly unremarkable. Stomach/Bowel: Stomach, small bowel and colon are  unremarkable. Appendix is not readily visualized. Vascular/Lymphatic: Atherosclerotic calcification of the arterial vasculature without abdominal aortic aneurysm. No pathologically enlarged lymph nodes. Reproductive:  There appears to be a TURP defect in the bladder. Other: Small bilateral inguinal hernias contain fat. No free fluid. Mesenteries and peritoneum are unremarkable. Musculoskeletal: No worrisome lytic or sclerotic lesions. Degenerative changes are seen in the spine. Postoperative or posttraumatic changes in the right iliac wing. IMPRESSION: 1. Mild esophageal wall thickening, as before. No evidence of metastatic disease. 2. Small area of subpleural consolidation in  the medial right upper lobe may be treatment related. 3. Heterogeneous right renal mass, highly worrisome for renal cell carcinoma. These results will be called to the ordering clinician or representative by the Radiologist Assistant, and communication documented in the PACS or zVision Dashboard. 4. Aortic atherosclerosis (ICD10-170.0). Coronary artery calcification. 5. Cholelithiasis. Electronically Signed   By: Lorin Picket M.D.   On: 05/12/2016 14:00   Ct Abdomen Pelvis W Contrast  Result Date: 05/12/2016 CLINICAL DATA:  Esophageal cancer. EXAM: CT CHEST, ABDOMEN, AND PELVIS WITH CONTRAST TECHNIQUE: Multidetector CT imaging of the chest, abdomen and pelvis was performed following the standard protocol during bolus administration of intravenous contrast. CONTRAST:  23m ISOVUE-300 IOPAMIDOL (ISOVUE-300) INJECTION 61% COMPARISON:  02/11/2016. FINDINGS: CT CHEST FINDINGS Cardiovascular: Right IJ Port-A-Cath terminates in the right atrium. Atherosclerotic calcification of the arterial vasculature, including coronary arteries. Heart is at the upper limits of normal in size to mildly enlarged. No pericardial effusion. Mediastinum/Nodes: No pathologically enlarged mediastinal, hilar or axillary lymph nodes. Esophageal wall may be slightly thickened. The esophagus is dilated and air-filled proximally. Lungs/Pleura: Mild centrilobular emphysema. Small area of subpleural airspace consolidation in the medial right upper lobe is grossly stable. Mild dependent atelectasis in the left lower lobe. Lungs are otherwise clear. Airway is unremarkable. Musculoskeletal: Flowing anterior osteophytosis in the thoracic spine. No worrisome lytic or sclerotic lesions. Old right rib fractures. CT ABDOMEN PELVIS FINDINGS Hepatobiliary: Liver is unremarkable. Numerous stones are seen in the gallbladder. No biliary ductal dilatation. Pancreas: Negative. Spleen: Negative. Adrenals/Urinary Tract: Adrenal glands are unremarkable. A  heterogeneous 3.2 cm mass is seen in the lower pole right kidney. Additional low-attenuation lesions in the kidneys measure up to 3.8 cm and 23 Hounsfield units on the left, difficult to characterize as simple cysts. Ureters are decompressed. Presumed TURP defect. Bladder is otherwise grossly unremarkable. Stomach/Bowel: Stomach, small bowel and colon are unremarkable. Appendix is not readily visualized. Vascular/Lymphatic: Atherosclerotic calcification of the arterial vasculature without abdominal aortic aneurysm. No pathologically enlarged lymph nodes. Reproductive:  There appears to be a TURP defect in the bladder. Other: Small bilateral inguinal hernias contain fat. No free fluid. Mesenteries and peritoneum are unremarkable. Musculoskeletal: No worrisome lytic or sclerotic lesions. Degenerative changes are seen in the spine. Postoperative or posttraumatic changes in the right iliac wing. IMPRESSION: 1. Mild esophageal wall thickening, as before. No evidence of metastatic disease. 2. Small area of subpleural consolidation in the medial right upper lobe may be treatment related. 3. Heterogeneous right renal mass, highly worrisome for renal cell carcinoma. These results will be called to the ordering clinician or representative by the Radiologist Assistant, and communication documented in the PACS or zVision Dashboard. 4. Aortic atherosclerosis (ICD10-170.0). Coronary artery calcification. 5. Cholelithiasis. Electronically Signed   By: MLorin PicketM.D.   On: 05/12/2016 14:00    ONCOLOGY HISTORY: Initial diagnosis was in approximately October 2016. This was followed by concurrent chemotherapy with infusional 5-FU and XRT.  Patient received  low-dose weekly carboplatinum and Taxol in approximately April and May 2017. He subsequently had a second recurrence and was initiated on Taxol 40 mg/m on days 1, 8, and 15 (Taxol was dose reduced 50% secondary to neuropathy) along with Ramucirumab 8 mg/m on days 1 and 8  starting September 27, 2015 through January 24, 2016. This was a 28 day cycle.   ASSESSMENT: Stage IIIb esophageal cancer, right renal mass  PLAN:    1. Stage IIIb esophageal cancer: Patient was noted to be HER-2 negative. Please see oncology history listed above. CT scan results from May 12, 2016 reviewed independently and reported as above with no obvious evidence of recurrent or metastatic disease. Essentially unchanged from scans at outside facility dating back to November 19, 2015. Continue simple observation and repeat scan in 4-6 months. Patient will then return to clinic 1-2 days later for further evaluation. If patient has recurrent disease, would reinitiate dose reduced Taxol and Ramucirumab. 2. Neuropathy: Continue gabapentin and hydrocodone as prescribed. 3. Esophageal stricture: Patient has a history of multiple esophageal dilations. Will refer to GI in the future if necessary. 4. Right renal mass: CT scan results as above. Patient noted to have incidental right renal mass that is increasing side from previous imaging. Case was discussed at cancer conference and a referral to urology has been made for consideration of surgical intervention. Will arrange appropriate follow-up after urology intervention.   Approximately 30 minutes was spent in discussion of which greater than 50% was consultation.  Patient expressed understanding and was in agreement with this plan. He also understands that He can call clinic at any time with any questions, concerns, or complaints.   Cancer Staging Squamous cell esophageal cancer [facility]) Staging form: Esophagus - Squamous Cell Carcinoma, AJCC 7th Edition - Clinical stage from 02/27/2016: Stage IIIB (T3, N2, M0) - Signed by Lloyd Huger, MD on 02/27/2016   Lloyd Huger, MD   05/16/2016 6:13 PM

## 2016-05-12 NOTE — Telephone Encounter (Signed)
IMPRESSION: 1. Mild esophageal wall thickening, as before. No evidence of metastatic disease. 2. Small area of subpleural consolidation in the medial right upper lobe may be treatment related. 3. Heterogeneous right renal mass, highly worrisome for renal cell carcinoma. These results will be called to the ordering clinician or representative by the Radiologist Assistant, and communication documented in the PACS or zVision Dashboard. 4. Aortic atherosclerosis (ICD10-170.0). Coronary artery calcification. 5. Cholelithiasis.

## 2016-05-12 NOTE — Telephone Encounter (Signed)
Patient has an appointment on Wednesday, I will discuss the results with him then.

## 2016-05-13 LAB — CANCER ANTIGEN 19-9: CA 19 9: 7 U/mL (ref 0–35)

## 2016-05-13 LAB — CEA: CEA: 1.4 ng/mL (ref 0.0–4.7)

## 2016-05-14 ENCOUNTER — Inpatient Hospital Stay (HOSPITAL_BASED_OUTPATIENT_CLINIC_OR_DEPARTMENT_OTHER): Payer: Medicare Other | Admitting: Oncology

## 2016-05-14 VITALS — BP 111/71 | HR 84 | Temp 98.5°F | Resp 18 | Wt 250.4 lb

## 2016-05-14 DIAGNOSIS — Z9221 Personal history of antineoplastic chemotherapy: Secondary | ICD-10-CM | POA: Diagnosis not present

## 2016-05-14 DIAGNOSIS — F0390 Unspecified dementia without behavioral disturbance: Secondary | ICD-10-CM

## 2016-05-14 DIAGNOSIS — H3530 Unspecified macular degeneration: Secondary | ICD-10-CM

## 2016-05-14 DIAGNOSIS — E785 Hyperlipidemia, unspecified: Secondary | ICD-10-CM

## 2016-05-14 DIAGNOSIS — Z8501 Personal history of malignant neoplasm of esophagus: Secondary | ICD-10-CM

## 2016-05-14 DIAGNOSIS — Z79899 Other long term (current) drug therapy: Secondary | ICD-10-CM

## 2016-05-14 DIAGNOSIS — G629 Polyneuropathy, unspecified: Secondary | ICD-10-CM

## 2016-05-14 DIAGNOSIS — J449 Chronic obstructive pulmonary disease, unspecified: Secondary | ICD-10-CM

## 2016-05-14 DIAGNOSIS — Z800 Family history of malignant neoplasm of digestive organs: Secondary | ICD-10-CM

## 2016-05-14 DIAGNOSIS — N2889 Other specified disorders of kidney and ureter: Secondary | ICD-10-CM

## 2016-05-14 DIAGNOSIS — I700 Atherosclerosis of aorta: Secondary | ICD-10-CM

## 2016-05-14 DIAGNOSIS — Z7984 Long term (current) use of oral hypoglycemic drugs: Secondary | ICD-10-CM

## 2016-05-14 DIAGNOSIS — K219 Gastro-esophageal reflux disease without esophagitis: Secondary | ICD-10-CM

## 2016-05-14 DIAGNOSIS — Z87891 Personal history of nicotine dependence: Secondary | ICD-10-CM

## 2016-05-14 DIAGNOSIS — C159 Malignant neoplasm of esophagus, unspecified: Secondary | ICD-10-CM

## 2016-05-14 DIAGNOSIS — Z8041 Family history of malignant neoplasm of ovary: Secondary | ICD-10-CM

## 2016-05-14 NOTE — Progress Notes (Signed)
Patient is here for follow up. Want to go over the results.

## 2016-05-29 ENCOUNTER — Encounter: Payer: Self-pay | Admitting: Urology

## 2016-05-29 ENCOUNTER — Ambulatory Visit (INDEPENDENT_AMBULATORY_CARE_PROVIDER_SITE_OTHER): Payer: Medicare Other | Admitting: Urology

## 2016-05-29 VITALS — BP 105/69 | HR 89 | Ht 71.0 in | Wt 240.0 lb

## 2016-05-29 DIAGNOSIS — N2889 Other specified disorders of kidney and ureter: Secondary | ICD-10-CM | POA: Diagnosis not present

## 2016-05-29 DIAGNOSIS — R350 Frequency of micturition: Secondary | ICD-10-CM

## 2016-05-29 DIAGNOSIS — N183 Chronic kidney disease, stage 3 unspecified: Secondary | ICD-10-CM

## 2016-05-29 NOTE — Progress Notes (Signed)
05/29/2016 8:37 AM   Miguel [name] [birth year] ET:[id number]  Referring provider: Coral Spikes, DO [address] [place], [place] [address]  No chief complaint on file.   HPI: 83 year old male with a history of esophageal cancer who presents today for further evaluation of an incidental 3.2 cm heterogeneous  enhancing right lower pole renal mass.  This was seen on staging CT abdomen pelvis with contrast on 05/02/16.  He has no obvious signs of metastatic disease otherwise.  Compared to previous imaging performed in 01/2016, the renal mass has increased up to 5 mm in size over the short interval. These images were previously reviewed by the radiologist at tumor board.  In terms of his esophageal cancer, he has been diagnosed with stage IIIB.  This was diagnosed in October 2016 and was treated with concurrent chemotherapy and radiation.  He's had one recurrence and treated with additional taxol and carboplatinum.  He is currently NED.  He had knee replacement surgery several years ago and had a significant time recovering from anesthesia and a prolonged recovery.  He does have a history of urinary symptoms including presumed overactive bladder for which he takes Toviaz prescribed many years ago per her previous urologist. He reports that he tried multiple medications for overactive bladder and ultimately had some improvement with Toviaz. He does not think that this is likely helping him any more. He does have multiple medical comorbidities contributing to his urinary symptoms including diabetes, lower extremity edema, takes Lasix, difficulty ambulating, and possibly undiagnosed sleep apnea.\  He denies any flank pain, weight loss, dysuria, or gross hematuria.  He is accompanied today by his daughter and wife.  PMH: Past Medical History:  Diagnosis Date  . Alcoholism ([place])   . Anxiety   . COPD (chronic obstructive pulmonary disease) ([place])   . Dementia 03/18/2016  . DM type 2  with diabetic peripheral neuropathy ([place]) 03/18/2016  . Esophageal cancer ([place])   . GERD (gastroesophageal reflux disease) 03/18/2016  . Hyperlipidemia   . Macular degeneration     Surgical History: Past Surgical History:  Procedure Laterality Date  . BACK SURGERY    . CATARACT EXTRACTION    . KNEE SURGERY      Home Medications:  Allergies as of 05/29/2016   No Known Allergies     Medication List       Accurate as of 05/29/16 11:59 PM. Always use your most recent med list.          ADVAIR DISKUS 250-50 MCG/DOSE Aepb Generic drug:  Fluticasone-Salmeterol Inhale 1 puff into the lungs daily.   ALPRAZolam 0.5 MG tablet Commonly known as:  XANAX Take 1 tablet by mouth as needed.   atorvastatin 40 MG tablet Commonly known as:  LIPITOR Take 1 tablet (40 mg total) by mouth daily.   cetirizine 10 MG tablet Commonly known as:  ZYRTEC Take 1 tablet by mouth daily.   clotrimazole 1 % cream Commonly known as:  CLOTRIMAZOLE AF Apply 1 application topically 2 (two) times daily.   donepezil 10 MG tablet Commonly known as:  ARICEPT Take 1 tablet by mouth daily.   fesoterodine 8 MG Tb24 tablet Commonly known as:  TOVIAZ Take 1 tablet (8 mg total) by mouth daily.   furosemide 20 MG tablet Commonly known as:  LASIX Take 1 tablet (20 mg total) by mouth daily.   gabapentin 300 MG capsule Commonly known as:  NEURONTIN Take 1 capsule by mouth 2 (two) times  daily.   glucose blood test strip Use as instructed   ketoconazole 2 % cream Commonly known as:  NIZORAL Apply 1 application topically daily.   metFORMIN 1000 MG tablet Commonly known as:  GLUCOPHAGE Take 1 tablet by mouth 2 (two) times daily.   omeprazole 20 MG capsule Commonly known as:  PRILOSEC Take 1 capsule (20 mg total) by mouth 2 (two) times daily before a meal.   STOOL SOFTENER 100 MG capsule Generic drug:  docusate sodium Take 1 tablet by mouth 2 (two) times daily.       Allergies: No Known  Allergies  Family History: Family History  Problem Relation Age of Onset  . Ovarian cancer Sister   . Throat cancer Brother   . Diabetes Mother   . Kidney disease Father   . Prostate cancer Neg Hx   . Kidney cancer Neg Hx     Social History:  reports that he has quit smoking. He has never used smokeless tobacco. He reports that he does not drink alcohol or use drugs.  ROS: UROLOGY Frequent Urination?: Yes Hard to postpone urination?: Yes Burning/pain with urination?: No Get up at night to urinate?: Yes Leakage of urine?: No Urine stream starts and stops?: No Trouble starting stream?: No Do you have to strain to urinate?: No Blood in urine?: No Urinary tract infection?: No Sexually transmitted disease?: No Injury to kidneys or bladder?: No Painful intercourse?: No Weak stream?: No Erection problems?: No Penile pain?: No  Gastrointestinal Nausea?: No Vomiting?: No Indigestion/heartburn?: Yes Diarrhea?: No Constipation?: No  Constitutional Fever: No Night sweats?: No Weight loss?: No Fatigue?: Yes  Skin Skin rash/lesions?: No Itching?: No  Eyes Blurred vision?: No Double vision?: No  Ears/Nose/Throat Sore throat?: Yes Sinus problems?: No  Hematologic/Lymphatic Swollen glands?: No Easy bruising?: Yes  Cardiovascular Leg swelling?: No Chest pain?: No  Respiratory Cough?: Yes Shortness of breath?: Yes  Endocrine Excessive thirst?: No  Musculoskeletal Back pain?: Yes Joint pain?: Yes  Neurological Headaches?: No Dizziness?: No  Psychologic Depression?: No Anxiety?: Yes  Physical Exam: BP 105/69   Pulse 89   Ht 5\' 11"  (1.803 m)   Wt 240 lb (108.9 kg)   BMI 33.47 kg/m   Constitutional:  Alert and oriented, No acute distress.  Elderly. Accompanied by family members today. HEENT: [place] AT, moist mucus membranes.  Trachea midline, no masses. Cardiovascular: No clubbing, cyanosis.  Mild bilateral lower extremity edema. Respiratory:  Normal respiratory effort, no increased work of breathing. GI: Abdomen is soft, nontender, nondistended, no abdominal masses.  Obese.   GU: No CVA tenderness.  Skin: No rashes, bruises or suspicious lesions. Neurologic: Grossly intact, no focal deficits, moving all 4 extremities. Psychiatric: Normal mood and affect.  Ambulating slowly, with cane.  Laboratory Data: Lab Results  Component Value Date   WBC 5.1 05/12/2016   HGB 14.6 05/12/2016   HCT 43.3 05/12/2016   MCV 95.7 05/12/2016   PLT 158 05/12/2016    Lab Results  Component Value Date   CREATININE 1.44 (H) 05/12/2016     Lab Results  Component Value Date   HGBA1C 5.4 03/18/2016    Urinalysis No results found for: COLORURINE, APPEARANCEUR, LABSPEC, PHURINE, GLUCOSEU, HGBUR, BILIRUBINUR, KETONESUR, PROTEINUR, UROBILINOGEN, NITRITE, LEUKOCYTESUR  Pertinent Imaging: CLINICAL DATA:  Esophageal cancer.  EXAM: CT CHEST, ABDOMEN, AND PELVIS WITH CONTRAST  TECHNIQUE: Multidetector CT imaging of the chest, abdomen and pelvis was performed following the standard protocol during bolus administration of intravenous contrast.  CONTRAST:  67mL  ISOVUE-300 IOPAMIDOL (ISOVUE-300) INJECTION 61%  COMPARISON:  02/11/2016.  FINDINGS: CT CHEST FINDINGS  Cardiovascular: Right IJ Port-A-Cath terminates in the right atrium. Atherosclerotic calcification of the arterial vasculature, including coronary arteries. Heart is at the upper limits of normal in size to mildly enlarged. No pericardial effusion.  Mediastinum/Nodes: No pathologically enlarged mediastinal, hilar or axillary lymph nodes. Esophageal wall may be slightly thickened. The esophagus is dilated and air-filled proximally.  Lungs/Pleura: Mild centrilobular emphysema. Small area of subpleural airspace consolidation in the medial right upper lobe is grossly stable. Mild dependent atelectasis in the left lower lobe. Lungs are otherwise clear. Airway is  unremarkable.  Musculoskeletal: Flowing anterior osteophytosis in the thoracic spine. No worrisome lytic or sclerotic lesions. Old right rib fractures.  CT ABDOMEN PELVIS FINDINGS  Hepatobiliary: Liver is unremarkable. Numerous stones are seen in the gallbladder. No biliary ductal dilatation.  Pancreas: Negative.  Spleen: Negative.  Adrenals/Urinary Tract: Adrenal glands are unremarkable. A heterogeneous 3.2 cm mass is seen in the lower pole right kidney. Additional low-attenuation lesions in the kidneys measure up to 3.8 cm and 23 Hounsfield units on the left, difficult to characterize as simple cysts. Ureters are decompressed. Presumed TURP defect. Bladder is otherwise grossly unremarkable.  Stomach/Bowel: Stomach, small bowel and colon are unremarkable. Appendix is not readily visualized.  Vascular/Lymphatic: Atherosclerotic calcification of the arterial vasculature without abdominal aortic aneurysm. No pathologically enlarged lymph nodes.  Reproductive:  There appears to be a TURP defect in the bladder.  Other: Small bilateral inguinal hernias contain fat. No free fluid. Mesenteries and peritoneum are unremarkable.  Musculoskeletal: No worrisome lytic or sclerotic lesions. Degenerative changes are seen in the spine. Postoperative or posttraumatic changes in the right iliac wing.  IMPRESSION: 1. Mild esophageal wall thickening, as before. No evidence of metastatic disease. 2. Small area of subpleural consolidation in the medial right upper lobe may be treatment related. 3. Heterogeneous right renal mass, highly worrisome for renal cell carcinoma. These results will be called to the ordering clinician or representative by the Radiologist Assistant, and communication documented in the PACS or zVision Dashboard. 4. Aortic atherosclerosis (ICD10-170.0). Coronary artery calcification. 5. Cholelithiasis.   Electronically Signed   By: Lorin Picket  M.D.   On: 05/12/2016 14:00  Imaging was reviewed personally today and with the patient. This is partially compared to previous CT scan from 01/2016.  Assessment & Plan:    1. Right renal mass Enlarging, enhancing 3.1 cm right posterior renal mass highly suspicious for renal cell carcinoma, asymptomatic.  A solid renal mass raises the suspicion of primary renal malignancy.  We discussed this in detail and in regards to the spectrum of renal masses which includes cysts (pure cysts are considered benign), solid masses and everything in between. The risk of metastasis increases as the size of solid renal mass increases. In general, it is believed that the risk of metastasis for renal masses less than 3-4 cm is small (up to approximately 5%) based mainly on large retrospective studies.  In some cases and especially in patients of older age and multiple comorbidities a surveillance approach may be appropriate. The treatment of solid renal masses includes: surveillance, cryoablation (percutaneous and laparoscopic) in addition to partial and complete nephrectomy (each with option of laparoscopic, robotic and open depending on appropriateness). Furthermore, nephrectomy appears to be an independent risk factor for the development of chronic kidney disease suggesting that nephron sparing approaches should be implored whenever feasible. We reviewed these options in context of the patients current  situation as well as the pros and cons of each.  Given his age and comorbidities, I do not feel that he is a good surgical candidate for the small renal mass. I would recommend either continued surveillance on a every 6 month interval versus consideration of ablative therapy. I'll refer him for an interventional radiology consult to discuss whether or not the mass is amenable to this.  We did review the risks and benefits of this procedure in detail today and all questions were answered.  He and his family are  agreeable with this plan. We will plan a follow-up in 6 months or sooner as needed. If the does not elect to proceed with ablative therapy, will obtain imaging at that point time if not artery performed for the purpose of esophageal cancer surveillance.  2. Stage III CKD Baseline Cr ~1.4 Would favor nephron sparing approach should any intervetion be considered.  3. Urinary frequency Likely multifactorial as mentioned in history of present illness. Behavioral modifications as possible were reviewed. I have recommended given his history of dementia and side effects from the medication, he should consider holding his Toviaz in order to assess whether or not this medication is truly beneficial. Recommendations including voiding diary prior to and after stopping the medication were reviewed.   Return in about 6 months (around 11/26/2016).  Hollice Espy, MD  [facility] [address] [place], [place] [address] [phone]  I spent 45 min with this patient of which greater than 50% was spent in counseling and coordination of care with the patient.

## 2016-06-03 ENCOUNTER — Observation Stay
Admission: EM | Admit: 2016-06-03 | Discharge: 2016-06-06 | Disposition: A | Discharge status: Home / Self Care | Payer: Medicare Other | Attending: Internal Medicine | Admitting: Internal Medicine

## 2016-06-03 ENCOUNTER — Encounter: Payer: Self-pay | Admitting: Emergency Medicine

## 2016-06-03 ENCOUNTER — Inpatient Hospital Stay
Admit: 2016-06-03 | Discharge: 2016-06-03 | Disposition: A | Discharge status: Home / Self Care | Payer: Medicare Other | Attending: Internal Medicine | Admitting: Internal Medicine

## 2016-06-03 ENCOUNTER — Observation Stay: Payer: Medicare Other

## 2016-06-03 ENCOUNTER — Emergency Department: Payer: Medicare Other

## 2016-06-03 ENCOUNTER — Other Ambulatory Visit: Payer: Self-pay | Admitting: Radiology

## 2016-06-03 DIAGNOSIS — K567 Ileus, unspecified: Secondary | ICD-10-CM

## 2016-06-03 DIAGNOSIS — I129 Hypertensive chronic kidney disease with stage 1 through stage 4 chronic kidney disease, or unspecified chronic kidney disease: Secondary | ICD-10-CM | POA: Diagnosis not present

## 2016-06-03 DIAGNOSIS — K560 Paralytic ileus: Secondary | ICD-10-CM | POA: Diagnosis not present

## 2016-06-03 DIAGNOSIS — R778 Other specified abnormalities of plasma proteins: Secondary | ICD-10-CM

## 2016-06-03 DIAGNOSIS — I248 Other forms of acute ischemic heart disease: Secondary | ICD-10-CM | POA: Insufficient documentation

## 2016-06-03 DIAGNOSIS — I4891 Unspecified atrial fibrillation: Secondary | ICD-10-CM

## 2016-06-03 DIAGNOSIS — E1122 Type 2 diabetes mellitus with diabetic chronic kidney disease: Secondary | ICD-10-CM | POA: Insufficient documentation

## 2016-06-03 DIAGNOSIS — F0390 Unspecified dementia without behavioral disturbance: Secondary | ICD-10-CM | POA: Diagnosis not present

## 2016-06-03 DIAGNOSIS — R7989 Other specified abnormal findings of blood chemistry: Secondary | ICD-10-CM | POA: Diagnosis not present

## 2016-06-03 DIAGNOSIS — R0602 Shortness of breath: Secondary | ICD-10-CM

## 2016-06-03 DIAGNOSIS — M40204 Unspecified kyphosis, thoracic region: Secondary | ICD-10-CM | POA: Insufficient documentation

## 2016-06-03 DIAGNOSIS — Z8501 Personal history of malignant neoplasm of esophagus: Secondary | ICD-10-CM | POA: Insufficient documentation

## 2016-06-03 DIAGNOSIS — E1142 Type 2 diabetes mellitus with diabetic polyneuropathy: Secondary | ICD-10-CM | POA: Insufficient documentation

## 2016-06-03 DIAGNOSIS — K219 Gastro-esophageal reflux disease without esophagitis: Secondary | ICD-10-CM | POA: Insufficient documentation

## 2016-06-03 DIAGNOSIS — N281 Cyst of kidney, acquired: Secondary | ICD-10-CM | POA: Diagnosis not present

## 2016-06-03 DIAGNOSIS — K4020 Bilateral inguinal hernia, without obstruction or gangrene, not specified as recurrent: Secondary | ICD-10-CM | POA: Insufficient documentation

## 2016-06-03 DIAGNOSIS — H3530 Unspecified macular degeneration: Secondary | ICD-10-CM | POA: Diagnosis not present

## 2016-06-03 DIAGNOSIS — N183 Chronic kidney disease, stage 3 unspecified: Secondary | ICD-10-CM

## 2016-06-03 DIAGNOSIS — F419 Anxiety disorder, unspecified: Secondary | ICD-10-CM | POA: Diagnosis not present

## 2016-06-03 DIAGNOSIS — N2889 Other specified disorders of kidney and ureter: Secondary | ICD-10-CM

## 2016-06-03 DIAGNOSIS — I5031 Acute diastolic (congestive) heart failure: Secondary | ICD-10-CM | POA: Diagnosis not present

## 2016-06-03 DIAGNOSIS — K8020 Calculus of gallbladder without cholecystitis without obstruction: Secondary | ICD-10-CM | POA: Insufficient documentation

## 2016-06-03 DIAGNOSIS — I313 Pericardial effusion (noninflammatory): Secondary | ICD-10-CM | POA: Diagnosis not present

## 2016-06-03 DIAGNOSIS — I700 Atherosclerosis of aorta: Secondary | ICD-10-CM | POA: Insufficient documentation

## 2016-06-03 DIAGNOSIS — R531 Weakness: Secondary | ICD-10-CM

## 2016-06-03 DIAGNOSIS — M8448XA Pathological fracture, other site, initial encounter for fracture: Secondary | ICD-10-CM | POA: Insufficient documentation

## 2016-06-03 DIAGNOSIS — I454 Nonspecific intraventricular block: Secondary | ICD-10-CM | POA: Insufficient documentation

## 2016-06-03 DIAGNOSIS — N289 Disorder of kidney and ureter, unspecified: Secondary | ICD-10-CM

## 2016-06-03 DIAGNOSIS — I2510 Atherosclerotic heart disease of native coronary artery without angina pectoris: Secondary | ICD-10-CM | POA: Insufficient documentation

## 2016-06-03 DIAGNOSIS — Z7982 Long term (current) use of aspirin: Secondary | ICD-10-CM | POA: Insufficient documentation

## 2016-06-03 DIAGNOSIS — J90 Pleural effusion, not elsewhere classified: Principal | ICD-10-CM

## 2016-06-03 DIAGNOSIS — I3139 Other pericardial effusion (noninflammatory): Secondary | ICD-10-CM

## 2016-06-03 DIAGNOSIS — J449 Chronic obstructive pulmonary disease, unspecified: Secondary | ICD-10-CM | POA: Insufficient documentation

## 2016-06-03 DIAGNOSIS — Z9889 Other specified postprocedural states: Secondary | ICD-10-CM

## 2016-06-03 DIAGNOSIS — M79604 Pain in right leg: Secondary | ICD-10-CM | POA: Diagnosis not present

## 2016-06-03 DIAGNOSIS — E782 Mixed hyperlipidemia: Secondary | ICD-10-CM | POA: Diagnosis not present

## 2016-06-03 DIAGNOSIS — Z794 Long term (current) use of insulin: Secondary | ICD-10-CM | POA: Insufficient documentation

## 2016-06-03 DIAGNOSIS — Z87891 Personal history of nicotine dependence: Secondary | ICD-10-CM | POA: Insufficient documentation

## 2016-06-03 LAB — URINALYSIS, COMPLETE (UACMP) WITH MICROSCOPIC
BILIRUBIN URINE: NEGATIVE
Bacteria, UA: NONE SEEN
GLUCOSE, UA: NEGATIVE mg/dL
HGB URINE DIPSTICK: NEGATIVE
Ketones, ur: NEGATIVE mg/dL
LEUKOCYTES UA: NEGATIVE
NITRITE: NEGATIVE
PROTEIN: NEGATIVE mg/dL
Specific Gravity, Urine: 1.021 (ref 1.005–1.030)
Squamous Epithelial / LPF: NONE SEEN
pH: 7 (ref 5.0–8.0)

## 2016-06-03 LAB — TSH
TSH: 1.973 u[IU]/mL (ref 0.350–4.500)
TSH: 1.986 u[IU]/mL (ref 0.350–4.500)

## 2016-06-03 LAB — TROPONIN I
TROPONIN I: 0.03 ng/mL — AB (ref <0.03)
TROPONIN I: 0.03 ng/mL — AB (ref <0.03)
Troponin I: 0.03 ng/mL (ref <0.03)

## 2016-06-03 LAB — COMPREHENSIVE METABOLIC PANEL
ALK PHOS: 116 U/L (ref 38–126)
ALT: 18 U/L (ref 17–63)
ANION GAP: 6 (ref 5–15)
AST: 28 U/L (ref 15–41)
Albumin: 3.7 g/dL (ref 3.5–5.0)
BILIRUBIN TOTAL: 1 mg/dL (ref 0.3–1.2)
BUN: 25 mg/dL — ABNORMAL HIGH (ref 6–20)
CO2: 30 mmol/L (ref 22–32)
Calcium: 8.5 mg/dL — ABNORMAL LOW (ref 8.9–10.3)
Chloride: 102 mmol/L (ref 101–111)
Creatinine, Ser: 1.76 mg/dL — ABNORMAL HIGH (ref 0.61–1.24)
GFR, EST AFRICAN AMERICAN: 39 mL/min — AB (ref >60)
GFR, EST NON AFRICAN AMERICAN: 34 mL/min — AB (ref >60)
Glucose, Bld: 148 mg/dL — ABNORMAL HIGH (ref 65–99)
POTASSIUM: 4.5 mmol/L (ref 3.5–5.1)
Sodium: 138 mmol/L (ref 135–145)
TOTAL PROTEIN: 6.8 g/dL (ref 6.5–8.1)

## 2016-06-03 LAB — GLUCOSE, PLEURAL OR PERITONEAL FLUID: Glucose, Fluid: 120 mg/dL

## 2016-06-03 LAB — CBC
HEMATOCRIT: 40.9 % (ref 40.0–52.0)
HEMOGLOBIN: 14 g/dL (ref 13.0–18.0)
MCH: 32.6 pg (ref 26.0–34.0)
MCHC: 34.2 g/dL (ref 32.0–36.0)
MCV: 95.3 fL (ref 80.0–100.0)
Platelets: 116 10*3/uL — ABNORMAL LOW (ref 150–440)
RBC: 4.3 MIL/uL — AB (ref 4.40–5.90)
RDW: 15.7 % — ABNORMAL HIGH (ref 11.5–14.5)
WBC: 8.8 10*3/uL (ref 3.8–10.6)

## 2016-06-03 LAB — BODY FLUID CELL COUNT WITH DIFFERENTIAL
EOS FL: 0 %
Lymphs, Fluid: 23 %
MONOCYTE-MACROPHAGE-SEROUS FLUID: 2 %
Neutrophil Count, Fluid: 75 %
Total Nucleated Cell Count, Fluid: 137 cu mm

## 2016-06-03 LAB — PROTIME-INR
INR: 1.24
PROTHROMBIN TIME: 15.7 s — AB (ref 11.4–15.2)

## 2016-06-03 LAB — INFLUENZA PANEL BY PCR (TYPE A & B)
Influenza A By PCR: NEGATIVE
Influenza B By PCR: NEGATIVE

## 2016-06-03 LAB — GLUCOSE, CAPILLARY
GLUCOSE-CAPILLARY: 104 mg/dL — AB (ref 65–99)
Glucose-Capillary: 103 mg/dL — ABNORMAL HIGH (ref 65–99)

## 2016-06-03 LAB — LIPASE, BLOOD: Lipase: 19 U/L (ref 11–51)

## 2016-06-03 LAB — APTT: aPTT: 33 seconds (ref 24–36)

## 2016-06-03 MED ORDER — POLYETHYLENE GLYCOL 3350 17 G PO PACK
17.0000 g | PACK | Freq: Every day | ORAL | Status: DC
  Administered 2016-06-04 – 2016-06-06 (×3): 17 g via ORAL
  Filled 2016-06-03 (×3): qty 1

## 2016-06-03 MED ORDER — SODIUM CHLORIDE 0.9% FLUSH: 3.0000 mL | INTRAVENOUS | Status: DC | PRN

## 2016-06-03 MED ORDER — PANTOPRAZOLE SODIUM 40 MG PO TBEC
40.0000 mg | DELAYED_RELEASE_TABLET | Freq: Every day | ORAL | Status: DC
  Administered 2016-06-03 – 2016-06-06 (×4): 40 mg via ORAL
  Filled 2016-06-03 (×4): qty 1

## 2016-06-03 MED ORDER — ACETAMINOPHEN 325 MG PO TABS
650.0000 mg | ORAL_TABLET | Freq: Once | ORAL | Status: AC
  Administered 2016-06-03: 650 mg via ORAL
  Filled 2016-06-03: qty 2

## 2016-06-03 MED ORDER — FESOTERODINE FUMARATE ER 8 MG PO TB24: 8.0000 mg | ORAL_TABLET | Freq: Every day | ORAL | Status: DC

## 2016-06-03 MED ORDER — ASPIRIN 81 MG PO CHEW
324.0000 mg | CHEWABLE_TABLET | Freq: Once | ORAL | Status: AC
  Administered 2016-06-03: 324 mg via ORAL
  Filled 2016-06-03: qty 4

## 2016-06-03 MED ORDER — ASPIRIN 81 MG PO CHEW
81.0000 mg | CHEWABLE_TABLET | Freq: Every day | ORAL | Status: DC
  Administered 2016-06-05 – 2016-06-06 (×2): 81 mg via ORAL
  Filled 2016-06-03 (×3): qty 1

## 2016-06-03 MED ORDER — FUROSEMIDE 20 MG PO TABS
20.0000 mg | ORAL_TABLET | Freq: Every day | ORAL | Status: DC
  Administered 2016-06-04 – 2016-06-06 (×3): 20 mg via ORAL
  Filled 2016-06-03 (×3): qty 1

## 2016-06-03 MED ORDER — INSULIN ASPART 100 UNIT/ML ~~LOC~~ SOLN
0.0000 [IU] | Freq: Three times a day (TID) | SUBCUTANEOUS | Status: DC
  Administered 2016-06-04 – 2016-06-05 (×2): 1 [IU] via SUBCUTANEOUS
  Administered 2016-06-06: 2 [IU] via SUBCUTANEOUS
  Filled 2016-06-03: qty 1
  Filled 2016-06-03: qty 2
  Filled 2016-06-03: qty 1

## 2016-06-03 MED ORDER — GUAIFENESIN-DM 100-10 MG/5ML PO SYRP
5.0000 mL | ORAL_SOLUTION | ORAL | Status: DC | PRN
  Administered 2016-06-03 – 2016-06-05 (×3): 5 mL via ORAL
  Filled 2016-06-03 (×3): qty 5

## 2016-06-03 MED ORDER — ALPRAZOLAM 0.5 MG PO TABS
0.5000 mg | ORAL_TABLET | Freq: Three times a day (TID) | ORAL | Status: DC
  Administered 2016-06-03 – 2016-06-05 (×6): 0.5 mg via ORAL
  Filled 2016-06-03 (×7): qty 1

## 2016-06-03 MED ORDER — SODIUM CHLORIDE 0.9 % IV BOLUS (SEPSIS)
1000.0000 mL | Freq: Once | INTRAVENOUS | Status: AC
  Administered 2016-06-03: 1000 mL via INTRAVENOUS

## 2016-06-03 MED ORDER — CLOTRIMAZOLE 1 % EX CREA
1.0000 "application " | TOPICAL_CREAM | Freq: Two times a day (BID) | CUTANEOUS | Status: DC | PRN
  Filled 2016-06-03: qty 15

## 2016-06-03 MED ORDER — SODIUM CHLORIDE 0.9 % IV SOLN: 250.0000 mL | INTRAVENOUS | Status: DC | PRN

## 2016-06-03 MED ORDER — SODIUM CHLORIDE 0.9% FLUSH
3.0000 mL | Freq: Two times a day (BID) | INTRAVENOUS | Status: DC
  Administered 2016-06-03 (×2): 3 mL via INTRAVENOUS

## 2016-06-03 MED ORDER — SODIUM CHLORIDE 0.9% FLUSH
3.0000 mL | Freq: Two times a day (BID) | INTRAVENOUS | Status: DC
  Administered 2016-06-03 – 2016-06-05 (×6): 3 mL via INTRAVENOUS

## 2016-06-03 MED ORDER — IOPAMIDOL (ISOVUE-370) INJECTION 76%
60.0000 mL | Freq: Once | INTRAVENOUS | Status: AC | PRN
  Administered 2016-06-03: 60 mL via INTRAVENOUS

## 2016-06-03 MED ORDER — KETOCONAZOLE 2 % EX CREA
1.0000 "application " | TOPICAL_CREAM | Freq: Every day | CUTANEOUS | Status: DC | PRN
  Filled 2016-06-03: qty 15

## 2016-06-03 MED ORDER — MOMETASONE FURO-FORMOTEROL FUM 200-5 MCG/ACT IN AERO
2.0000 | INHALATION_SPRAY | Freq: Two times a day (BID) | RESPIRATORY_TRACT | Status: DC
  Administered 2016-06-03 – 2016-06-06 (×6): 2 via RESPIRATORY_TRACT
  Filled 2016-06-03: qty 8.8

## 2016-06-03 MED ORDER — LORATADINE 10 MG PO TABS: 10.0000 mg | ORAL_TABLET | Freq: Every day | ORAL | Status: DC | PRN

## 2016-06-03 MED ORDER — ATORVASTATIN CALCIUM 20 MG PO TABS
40.0000 mg | ORAL_TABLET | Freq: Every day | ORAL | Status: DC
  Administered 2016-06-03 – 2016-06-06 (×4): 40 mg via ORAL
  Filled 2016-06-03 (×4): qty 2

## 2016-06-03 MED ORDER — GABAPENTIN 300 MG PO CAPS
300.0000 mg | ORAL_CAPSULE | Freq: Two times a day (BID) | ORAL | Status: DC
  Administered 2016-06-03 – 2016-06-06 (×7): 300 mg via ORAL
  Filled 2016-06-03 (×7): qty 1

## 2016-06-03 MED ORDER — METOPROLOL TARTRATE 25 MG PO TABS
12.5000 mg | ORAL_TABLET | Freq: Two times a day (BID) | ORAL | Status: DC
  Administered 2016-06-03 – 2016-06-05 (×3): 12.5 mg via ORAL
  Filled 2016-06-03 (×5): qty 1

## 2016-06-03 MED ORDER — ENOXAPARIN SODIUM 40 MG/0.4ML ~~LOC~~ SOLN
40.0000 mg | SUBCUTANEOUS | Status: DC
  Administered 2016-06-03 – 2016-06-05 (×3): 40 mg via SUBCUTANEOUS
  Filled 2016-06-03 (×3): qty 0.4

## 2016-06-03 NOTE — Consult Note (Signed)
[facility] Consultation Note  Patient ID: Miguel [name], MRN: [id number], DOB/AGE: 04/27/1929 83 y.o. Admit date: 06/03/2016   Date of Consult: 06/03/2016 Primary Physician: Coral Spikes, DO Primary Cardiologist: None  Chief Complaint:  Chief Complaint  Patient presents with  . Abdominal Pain   Reason for Consult: atrial fibrillation  HPI: 83 y.o. male with diabetes with complication essential hypertension mixed hyperlipidemia and diabetes and gastroesophageal reflux who is had abdominal distention and abdominal discomfort and admitted to the hospital. When seen in the hospital emergency room the patient did have some shortness of breath as well as pleural effusion and mild pericardial effusion by CAT scan. Pleural effusion on the left to had a thoracentesis which appears to be benign in nature this time and did improve his oxygenation and shortness of breath. Additionally the patient was placed on the Lasix for possible continued to reduction and pleural effusion as well. The patient has had also a new onset of the atrial fibrillation with controlled ventricular rate on metoprolol. The patient has not had any knowledge of this irregular heartbeat and has had no evidence of chest pain or other significant cardiovascular concerns. The patient does have the elevated troponin 0.03 more consistent with demand ischemia rather than acute coronary syndrome.  Past Medical History:  Diagnosis Date  . Alcoholism ([place])   . Anxiety   . COPD (chronic obstructive pulmonary disease) ([place])   . Dementia 03/18/2016  . DM type 2 with diabetic peripheral neuropathy ([place]) 03/18/2016  . Esophageal cancer ([place])   . GERD (gastroesophageal reflux disease) 03/18/2016  . Hyperlipidemia   . Macular degeneration       Surgical History:  Past Surgical History:  Procedure Laterality Date  . BACK SURGERY    . CATARACT EXTRACTION    . KNEE SURGERY       Home Meds: Prior to Admission medications    Medication Sig Start Date End Date Taking? Authorizing Provider  ADVAIR DISKUS 250-50 MCG/DOSE AEPB Inhale 1 puff into the lungs daily. 01/22/16  Yes Historical Provider, MD  ALPRAZolam Duanne Moron) 0.5 MG tablet Take 1 tablet by mouth 3 (three) times daily.  02/06/16  Yes Historical Provider, MD  atorvastatin (LIPITOR) 40 MG tablet Take 1 tablet (40 mg total) by mouth daily. 03/20/16  Yes Coral Spikes, DO  cetirizine (ZYRTEC) 10 MG tablet Take 10 mg by mouth daily.  01/22/16  Yes Historical Provider, MD  clotrimazole (CLOTRIMAZOLE AF) 1 % cream Apply 1 application topically 2 (two) times daily. 03/18/16  Yes Coral Spikes, DO  furosemide (LASIX) 20 MG tablet Take 1 tablet (20 mg total) by mouth daily. 04/17/16  Yes Coral Spikes, DO  gabapentin (NEURONTIN) 300 MG capsule Take 1 capsule by mouth 2 (two) times daily.    Yes Historical Provider, MD  ketoconazole (NIZORAL) 2 % cream Apply 1 application topically daily. 03/25/16  Yes Coral Spikes, DO  Multiple Vitamins-Minerals (CENTRUM SILVER PO) Take 1 tablet by mouth daily.   Yes Historical Provider, MD  omeprazole (PRILOSEC) 20 MG capsule Take 1 capsule (20 mg total) by mouth 2 (two) times daily before a meal. 03/18/16  Yes Coral Spikes, DO  fesoterodine (TOVIAZ) 8 MG TB24 tablet Take 1 tablet (8 mg total) by mouth daily. Patient not taking: Reported on 06/03/2016 04/24/16   Coral Spikes, DO  glucose blood test strip Use as instructed 04/17/16   Coral Spikes, DO  STOOL SOFTENER 100 MG capsule  Take 100 mg by mouth 2 (two) times daily.  01/22/16   Historical Provider, MD    Inpatient Medications:  . ALPRAZolam  0.5 mg Oral TID  . aspirin  81 mg Oral Daily  . atorvastatin  40 mg Oral Daily  . enoxaparin (LOVENOX) injection  40 mg Subcutaneous Q24H  . furosemide  20 mg Oral Daily  . gabapentin  300 mg Oral BID  . insulin aspart  0-9 Units Subcutaneous TID WC  . metoprolol tartrate  12.5 mg Oral BID  . mometasone-formoterol  2 puff Inhalation BID  .  pantoprazole  40 mg Oral Daily  . polyethylene glycol  17 g Oral Daily  . sodium chloride flush  3 mL Intravenous Q12H  . sodium chloride flush  3 mL Intravenous Q12H     Allergies: No Known Allergies  Social History   Social History  . Marital status: Married    Spouse name: N/A  . Number of children: N/A  . Years of education: N/A   Occupational History  . Not on file.   Social History Main Topics  . Smoking status: Former Research scientist (life sciences)  . Smokeless tobacco: Never Used  . Alcohol use No  . Drug use: No  . Sexual activity: Not Currently    Partners: Female   Other Topics Concern  . Not on file   Social History Narrative  . No narrative on file     Family History  Problem Relation Age of Onset  . Ovarian cancer Sister   . Throat cancer Brother   . Diabetes Mother   . Kidney disease Father   . Prostate cancer Neg Hx   . Kidney cancer Neg Hx      Review of Systems Positive for Shortness of breath abdominal discomfort Negative for: General:  chills, fever, night sweats or weight changes.  Cardiovascular: PND orthopnea syncope dizziness  Dermatological skin lesions rashes Respiratory: Cough congestion Urologic: Frequent urination urination at night and hematuria Abdominal: negative for nausea, vomiting, diarrhea, bright red blood per rectum, melena, or hematemesis Neurologic: negative for visual changes, and/or hearing changes  All other systems reviewed and are otherwise negative except as noted above.  Labs:  Recent Labs  06/03/16 0837 06/03/16 1547  TROPONINI 0.03* 0.03*   Lab Results  Component Value Date   WBC 8.8 06/03/2016   HGB 14.0 06/03/2016   HCT 40.9 06/03/2016   MCV 95.3 06/03/2016   PLT 116 (L) 06/03/2016    Recent Labs Lab 06/03/16 0837  NA 138  K 4.5  CL 102  CO2 30  BUN 25*  CREATININE 1.76*  CALCIUM 8.5*  PROT 6.8  BILITOT 1.0  ALKPHOS 116  ALT 18  AST 28  GLUCOSE 148*   Lab Results  Component Value Date   CHOL 203 (H)  03/18/2016   HDL 48.40 03/18/2016   TRIG 204.0 (H) 03/18/2016   No results found for: DDIMER  Radiology/Studies:  Dg Abdomen 1 View  Result Date: 06/03/2016 CLINICAL DATA:  Abdominal pain. EXAM: ABDOMEN - 1 VIEW COMPARISON:  CT 05/12/2016. FINDINGS: Soft tissue structures are unremarkable. Distended loops of small bowel noted. Colonic gas pattern is normal. No free air. Degenerative changes thoracolumbar spine. Left base subsegmental atelectasis. IMPRESSION: 1. Distended loops of small bowel noted. Colonic gas pattern normal. Findings suggest mild adynamic ileus. 2. Left base subsegmental atelectasis again noted. Electronically Signed   By: Kaycee   On: 06/03/2016 10:18   Ct Chest W Contrast  Result  Date: 05/12/2016 CLINICAL DATA:  Esophageal cancer. EXAM: CT CHEST, ABDOMEN, AND PELVIS WITH CONTRAST TECHNIQUE: Multidetector CT imaging of the chest, abdomen and pelvis was performed following the standard protocol during bolus administration of intravenous contrast. CONTRAST:  58mL ISOVUE-300 IOPAMIDOL (ISOVUE-300) INJECTION 61% COMPARISON:  02/11/2016. FINDINGS: CT CHEST FINDINGS Cardiovascular: Right IJ Port-A-Cath terminates in the right atrium. Atherosclerotic calcification of the arterial vasculature, including coronary arteries. Heart is at the upper limits of normal in size to mildly enlarged. No pericardial effusion. Mediastinum/Nodes: No pathologically enlarged mediastinal, hilar or axillary lymph nodes. Esophageal wall may be slightly thickened. The esophagus is dilated and air-filled proximally. Lungs/Pleura: Mild centrilobular emphysema. Small area of subpleural airspace consolidation in the medial right upper lobe is grossly stable. Mild dependent atelectasis in the left lower lobe. Lungs are otherwise clear. Airway is unremarkable. Musculoskeletal: Flowing anterior osteophytosis in the thoracic spine. No worrisome lytic or sclerotic lesions. Old right rib fractures. CT ABDOMEN  PELVIS FINDINGS Hepatobiliary: Liver is unremarkable. Numerous stones are seen in the gallbladder. No biliary ductal dilatation. Pancreas: Negative. Spleen: Negative. Adrenals/Urinary Tract: Adrenal glands are unremarkable. A heterogeneous 3.2 cm mass is seen in the lower pole right kidney. Additional low-attenuation lesions in the kidneys measure up to 3.8 cm and 23 Hounsfield units on the left, difficult to characterize as simple cysts. Ureters are decompressed. Presumed TURP defect. Bladder is otherwise grossly unremarkable. Stomach/Bowel: Stomach, small bowel and colon are unremarkable. Appendix is not readily visualized. Vascular/Lymphatic: Atherosclerotic calcification of the arterial vasculature without abdominal aortic aneurysm. No pathologically enlarged lymph nodes. Reproductive:  There appears to be a TURP defect in the bladder. Other: Small bilateral inguinal hernias contain fat. No free fluid. Mesenteries and peritoneum are unremarkable. Musculoskeletal: No worrisome lytic or sclerotic lesions. Degenerative changes are seen in the spine. Postoperative or posttraumatic changes in the right iliac wing. IMPRESSION: 1. Mild esophageal wall thickening, as before. No evidence of metastatic disease. 2. Small area of subpleural consolidation in the medial right upper lobe may be treatment related. 3. Heterogeneous right renal mass, highly worrisome for renal cell carcinoma. These results will be called to the ordering clinician or representative by the Radiologist Assistant, and communication documented in the PACS or zVision Dashboard. 4. Aortic atherosclerosis (ICD10-170.0). Coronary artery calcification. 5. Cholelithiasis. Electronically Signed   By: Lorin Picket M.D.   On: 05/12/2016 14:00   Ct Angio Chest Pe W And/or Wo Contrast  Result Date: 06/03/2016 CLINICAL DATA:  83 year old diabetic male with esophageal cancer and dementia presenting with abdominal pain for the past 2 days. Right-sided chest  pain. Atrial fibrillation. Initial encounter. EXAM: CT ANGIOGRAPHY CHEST WITH CONTRAST TECHNIQUE: Multidetector CT imaging of the chest was performed using the standard protocol during bolus administration of intravenous contrast. Multiplanar CT image reconstructions and MIPs were obtained to evaluate the vascular anatomy. CONTRAST:  60 cc Isovue 370. COMPARISON:  05/12/2016. FINDINGS: Cardiovascular: No pulmonary embolus or aortic dissection. Right central line tip right atrium. Non-opacified blood adjacent to the central line probably represents mixing of non-opacified blood rather than thrombus. Cardiomegaly. Small pericardial effusion/ pericardial thickening. Coronary artery calcifications. Mediastinum/Nodes: Esophageal wall thickening (most notable superior thoracic esophagus) similar to prior exam. No adenopathy. Lungs/Pleura: Interval development of small to moderate-size left-sided pleural effusion. Adjacent passive atelectasis. Post therapy changes medial aspect right lung. Upper Abdomen: No acute abnormality. Right renal mass noted on prior exam not of imaged on the current exam. Musculoskeletal: Thoracic kyphosis with flowing confluent anterior osteophyte without destructive lesion. Review of  the MIP images confirms the above findings. IMPRESSION: No pulmonary embolus or aortic dissection. Interval development of small to moderate-size left-sided pleural effusion. Adjacent passive atelectasis. Post therapy changes medial aspect right lung. Esophageal wall thickening (most notable superior thoracic esophagus) similar to prior exam. Cardiomegaly. Small pericardial effusion/pericardial thickening. Coronary artery calcifications. Electronically Signed   By: Genia Del M.D.   On: 06/03/2016 10:45   Ct Abdomen Pelvis W Contrast  Result Date: 05/12/2016 CLINICAL DATA:  Esophageal cancer. EXAM: CT CHEST, ABDOMEN, AND PELVIS WITH CONTRAST TECHNIQUE: Multidetector CT imaging of the chest, abdomen and pelvis  was performed following the standard protocol during bolus administration of intravenous contrast. CONTRAST:  35mL ISOVUE-300 IOPAMIDOL (ISOVUE-300) INJECTION 61% COMPARISON:  02/11/2016. FINDINGS: CT CHEST FINDINGS Cardiovascular: Right IJ Port-A-Cath terminates in the right atrium. Atherosclerotic calcification of the arterial vasculature, including coronary arteries. Heart is at the upper limits of normal in size to mildly enlarged. No pericardial effusion. Mediastinum/Nodes: No pathologically enlarged mediastinal, hilar or axillary lymph nodes. Esophageal wall may be slightly thickened. The esophagus is dilated and air-filled proximally. Lungs/Pleura: Mild centrilobular emphysema. Small area of subpleural airspace consolidation in the medial right upper lobe is grossly stable. Mild dependent atelectasis in the left lower lobe. Lungs are otherwise clear. Airway is unremarkable. Musculoskeletal: Flowing anterior osteophytosis in the thoracic spine. No worrisome lytic or sclerotic lesions. Old right rib fractures. CT ABDOMEN PELVIS FINDINGS Hepatobiliary: Liver is unremarkable. Numerous stones are seen in the gallbladder. No biliary ductal dilatation. Pancreas: Negative. Spleen: Negative. Adrenals/Urinary Tract: Adrenal glands are unremarkable. A heterogeneous 3.2 cm mass is seen in the lower pole right kidney. Additional low-attenuation lesions in the kidneys measure up to 3.8 cm and 23 Hounsfield units on the left, difficult to characterize as simple cysts. Ureters are decompressed. Presumed TURP defect. Bladder is otherwise grossly unremarkable. Stomach/Bowel: Stomach, small bowel and colon are unremarkable. Appendix is not readily visualized. Vascular/Lymphatic: Atherosclerotic calcification of the arterial vasculature without abdominal aortic aneurysm. No pathologically enlarged lymph nodes. Reproductive:  There appears to be a TURP defect in the bladder. Other: Small bilateral inguinal hernias contain fat. No  free fluid. Mesenteries and peritoneum are unremarkable. Musculoskeletal: No worrisome lytic or sclerotic lesions. Degenerative changes are seen in the spine. Postoperative or posttraumatic changes in the right iliac wing. IMPRESSION: 1. Mild esophageal wall thickening, as before. No evidence of metastatic disease. 2. Small area of subpleural consolidation in the medial right upper lobe may be treatment related. 3. Heterogeneous right renal mass, highly worrisome for renal cell carcinoma. These results will be called to the ordering clinician or representative by the Radiologist Assistant, and communication documented in the PACS or zVision Dashboard. 4. Aortic atherosclerosis (ICD10-170.0). Coronary artery calcification. 5. Cholelithiasis. Electronically Signed   By: Lorin Picket M.D.   On: 05/12/2016 14:00   US Venous Img Lower Bilateral  Result Date: 06/03/2016 CLINICAL DATA:  Lower extremity pain for about 2 days EXAM: BILATERAL LOWER EXTREMITY VENOUS DOPPLER ULTRASOUND TECHNIQUE: Gray-scale sonography with graded compression, as well as color Doppler and duplex ultrasound were performed to evaluate the lower extremity deep venous systems from the level of the common femoral vein and including the common femoral, femoral, profunda femoral, popliteal and calf veins including the posterior tibial, peroneal and gastrocnemius veins when visible. The superficial great saphenous vein was also interrogated. Spectral Doppler was utilized to evaluate flow at rest and with distal augmentation maneuvers in the common femoral, femoral and popliteal veins. COMPARISON:  None. FINDINGS: RIGHT LOWER  EXTREMITY Common Femoral Vein: No evidence of thrombus. Normal compressibility, respiratory phasicity and response to augmentation. Saphenofemoral Junction: No evidence of thrombus. Normal compressibility and flow on color Doppler imaging. Profunda Femoral Vein: No evidence of thrombus. Normal compressibility and flow on  color Doppler imaging. Femoral Vein: No evidence of thrombus. Normal compressibility, respiratory phasicity and response to augmentation. Popliteal Vein: No evidence of thrombus. Normal compressibility, respiratory phasicity and response to augmentation. Calf Veins: No evidence of thrombus. Normal compressibility and flow on color Doppler imaging. Superficial Great Saphenous Vein: No evidence of thrombus. Normal compressibility and flow on color Doppler imaging. Venous Reflux:  None. Other Findings:  None. LEFT LOWER EXTREMITY Common Femoral Vein: No evidence of thrombus. Normal compressibility, respiratory phasicity and response to augmentation. Saphenofemoral Junction: No evidence of thrombus. Normal compressibility and flow on color Doppler imaging. Profunda Femoral Vein: No evidence of thrombus. Normal compressibility and flow on color Doppler imaging. Femoral Vein: No evidence of thrombus. Normal compressibility, respiratory phasicity and response to augmentation. Popliteal Vein: No evidence of thrombus. Normal compressibility, respiratory phasicity and response to augmentation. Calf Veins: No evidence of thrombus. Normal compressibility and flow on color Doppler imaging. Superficial Great Saphenous Vein: No evidence of thrombus. Normal compressibility and flow on color Doppler imaging. Venous Reflux:  None. Other Findings:  None. IMPRESSION: No evidence of deep venous thrombosis bilateral lower extremity. Electronically Signed   By: Lahoma Crocker M.D.   On: 06/03/2016 10:04   Dg Chest Port 1 View  Result Date: 06/03/2016 CLINICAL DATA:  S/p left thoracentesis EXAM: PORTABLE CHEST - 1 VIEW COMPARISON:  CT from earlier the same day FINDINGS: No pneumothorax. Persisting consolidation/atelectasis laterally at the left lung base. Right lung clear. Heart size upper limits normal for technique.  Atheromatous aorta. Can not exclude small residual left pleural effusion. Stable right IJ port catheter to the cavoatrial  junction. Visualized bones unremarkable. IMPRESSION: 1. No pneumothorax post left thoracentesis. Electronically Signed   By: Lucrezia Europe M.D.   On: 06/03/2016 13:53   US Thoracentesis Asp Pleural Space W/img Guide  Result Date: 06/03/2016 CLINICAL DATA:  Shortness of Breath.  Left pleural effusion on CT. EXAM: EXAM THORACENTESIS WITH ULTRASOUND TECHNIQUE: The procedure, risks (including but not limited to bleeding, infection, organ damage ), benefits, and alternatives were explained to the family and patient. Questions regarding the procedure were encouraged and answered. The spouse understands and consents to the procedure. Survey ultrasound of the left hemithorax was performed and an appropriate skin entry site was localized. Site was marked, prepped with Betadine, draped in usual sterile fashion, infiltrated locally with 1% lidocaine. The Saf-T-Centesis needle was advanced into the pleural space. Clear yellow fluid returned. 325 mL was removed. Post procedure imaging shows no residual fluid. Sample sent for the requested laboratory studies. The patient tolerated procedure well. COMPLICATIONS: COMPLICATIONS None immediate IMPRESSION: Technically successful ultrasound-guided left thoracentesis. Follow-up chest radiograph shows no pneumothorax. Electronically Signed   By: Lucrezia Europe M.D.   On: 06/03/2016 13:55    EKG: Atrial fibrillation with controlled ventricular rate and bundle branch block  Weights: Filed Weights   06/03/16 0833  Weight: 108.9 kg (240 lb)     Physical Exam: Blood pressure 113/68, pulse 87, temperature 97.3 F (36.3 C), temperature source Oral, resp. rate 18, height 5\' 11"  (1.803 m), weight 108.9 kg (240 lb), SpO2 98 %. Body mass index is 33.47 kg/m. General: Well developed, well nourished, in no acute distress. Head eyes ears nose throat: Normocephalic, atraumatic, sclera  non-icteric, no xanthomas, nares are without discharge. No apparent thyromegaly and/or mass  Lungs:  Normal respiratory effort.  no wheezes, no rales, no rhonchi.  Heart: Irregular with normal S1 S2. no murmur gallop, no rub, PMI is normal size and placement, carotid upstroke normal without bruit, jugular venous pressure is normal Abdomen: Soft, non-tender, non-distended with normoactive bowel sounds. No hepatomegaly. No rebound/guarding. No obvious abdominal masses. Abdominal aorta is normal size without bruit Extremities: Trace edema. no cyanosis, no clubbing, no ulcers  Peripheral : 2+ bilateral upper extremity pulses, 2+ bilateral femoral pulses, 2+ bilateral dorsal pedal pulse Neuro: Not Alert and oriented. No facial asymmetry. No focal deficit. Moves all extremities spontaneously. Musculoskeletal: Normal muscle tone without kyphosis Psych:  Does not Responds to questions appropriately with a normal affect.    Assessment: 83 year old with essential hypertension makes hyperlipidemia dementia diabetes with complication abdominal discomfort with new onset A. fib atrial fibrillation with controlled ventricular rate and pleural and pericardial effusion causing shortness of breath slightly improved after thoracentesis without evidence of myocardial infarction  Plan: 1. Continue diuresis after thoracentesis for reduction of pleural effusion and shortness of breath 2. Go-cart echocardiogram for extent of pericardial effusion LV systolic dysfunction valvular heart disease contributing to above 3. Metoprolol for heart rate control of atrial fibrillation 4. Abstain from anticoagulation at this time and use subcutaneous heparin until further evaluation of risk of bleeding status post thoracentesis and concerns of abdominal discomfort 5. High intensity cholesterol therapy 6. Further diagnostic testing and treatment options after above  Signed, Corey Skains M.D. [facility] 06/03/2016, 5:05 PM

## 2016-06-03 NOTE — ED Notes (Signed)
Pt noted to be back in rapid rhythm and multi beat runs of v tach.  Called dr Manuella Ghazi who states to call cardiology.  Pt denies pain, BP stable.  Pt alert and oriented, family at bedside.

## 2016-06-03 NOTE — ED Notes (Signed)
Pt to Ultrasound via stretcher

## 2016-06-03 NOTE — ED Notes (Signed)
Pt ambulated to bathroom with RN

## 2016-06-03 NOTE — ED Triage Notes (Signed)
Pt to ed with c/o abd pain intermittent x 2 days.  Pt denies n/v/d.  Pt placed on cm on arrival to ed, ekg done and shown to dr Mariea Clonts.  Pt alert and oriented to place. Per family pt has hx of dementia, diabetes, and esophageal cancer. Pt with noted port to right chest area.  Pt skin warm and dry.  Pt in a fib on monitor.  HR 95.

## 2016-06-03 NOTE — ED Notes (Signed)
Patient transported to Ultrasound for thoracentesis 

## 2016-06-03 NOTE — ED Notes (Signed)
Per family pt mostly at baseline mental status. NAD.

## 2016-06-03 NOTE — H&P (Signed)
[facility] [place] NAME: Miguel [name]    MR#:  [id number]  DATE OF BIRTH:  1929-09-24  DATE OF ADMISSION:  06/03/2016  PRIMARY CARE PHYSICIAN: Coral Spikes, DO   REQUESTING/REFERRING PHYSICIAN: Eula Listen MD  CHIEF COMPLAINT:   Chief Complaint  Patient presents with  . Abdominal Pain    HISTORY OF PRESENT ILLNESS: Miguel [name]  is a 83 y.o. male with a known history of Anxiety, COPD, dementia, diabetes type 2, esophageal cancer, GERD, hyperlipidemia, macular degeneration) brought in from home because he was complaining of abdominal pain. According to patient's daughter-in-law his wife thought that he was not acting unusual. Patient came in with these vague symptoms he also had a low-grade temperature. His symptoms have now resolved. Patient was evaluated in the ER with a CT scan of the chest which showed pericardial effusion as well as pleural effusion. Patient also had abdominal x-ray which showed adynamic ileus. Patient also was noted to have atrial fibrillation which is new diagnosis for him.   PAST MEDICAL HISTORY:   Past Medical History:  Diagnosis Date  . Alcoholism ([place])   . Anxiety   . COPD (chronic obstructive pulmonary disease) ([place])   . Dementia 03/18/2016  . DM type 2 with diabetic peripheral neuropathy ([place]) 03/18/2016  . Esophageal cancer ([place])   . GERD (gastroesophageal reflux disease) 03/18/2016  . Hyperlipidemia   . Macular degeneration     PAST SURGICAL HISTORY: Past Surgical History:  Procedure Laterality Date  . BACK SURGERY    . CATARACT EXTRACTION    . KNEE SURGERY      SOCIAL HISTORY:  Social History  Substance Use Topics  . Smoking status: Former Research scientist (life sciences)  . Smokeless tobacco: Never Used  . Alcohol use No    FAMILY HISTORY:  Family History  Problem Relation Age of Onset  . Ovarian cancer Sister   . Throat cancer Brother   . Diabetes Mother   . Kidney disease Father   . Prostate  cancer Neg Hx   . Kidney cancer Neg Hx     DRUG ALLERGIES: No Known Allergies  REVIEW OF SYSTEMS:   CONSTITUTIONAL: No fever, fatigue or weakness.  EYES: No blurred or double vision.  EARS, NOSE, AND THROAT: No tinnitus or ear pain.  RESPIRATORY: No cough, shortness of breath, wheezing or hemoptysis.  CARDIOVASCULAR: No chest pain, orthopnea, edema.  GASTROINTESTINAL: No nausea, vomiting, diarrhea orPositive abdominal pain earlier.  GENITOURINARY: No dysuria, hematuria.  ENDOCRINE: No polyuria, nocturia,  HEMATOLOGY: No anemia, easy bruising or bleeding SKIN: No rash or lesion. MUSCULOSKELETAL: No joint pain or arthritis.   NEUROLOGIC: No tingling, numbness, weakness.  PSYCHIATRY: No anxiety or depression.   MEDICATIONS AT HOME:  Prior to Admission medications   Medication Sig Start Date End Date Taking? Authorizing Provider  ADVAIR DISKUS 250-50 MCG/DOSE AEPB Inhale 1 puff into the lungs daily. 01/22/16  Yes Historical Provider, MD  ALPRAZolam Duanne Moron) 0.5 MG tablet Take 1 tablet by mouth 3 (three) times daily.  02/06/16  Yes Historical Provider, MD  atorvastatin (LIPITOR) 40 MG tablet Take 1 tablet (40 mg total) by mouth daily. 03/20/16  Yes Coral Spikes, DO  cetirizine (ZYRTEC) 10 MG tablet Take 10 mg by mouth daily.  01/22/16  Yes Historical Provider, MD  clotrimazole (CLOTRIMAZOLE AF) 1 % cream Apply 1 application topically 2 (two) times daily. 03/18/16  Yes Coral Spikes, DO  furosemide (LASIX) 20 MG  tablet Take 1 tablet (20 mg total) by mouth daily. 04/17/16  Yes Coral Spikes, DO  gabapentin (NEURONTIN) 300 MG capsule Take 1 capsule by mouth 2 (two) times daily.    Yes Historical Provider, MD  ketoconazole (NIZORAL) 2 % cream Apply 1 application topically daily. 03/25/16  Yes Coral Spikes, DO  Multiple Vitamins-Minerals (CENTRUM SILVER PO) Take 1 tablet by mouth daily.   Yes Historical Provider, MD  omeprazole (PRILOSEC) 20 MG capsule Take 1 capsule (20 mg total) by mouth 2 (two)  times daily before a meal. 03/18/16  Yes Coral Spikes, DO  fesoterodine (TOVIAZ) 8 MG TB24 tablet Take 1 tablet (8 mg total) by mouth daily. Patient not taking: Reported on 06/03/2016 04/24/16   Coral Spikes, DO  glucose blood test strip Use as instructed 04/17/16   Coral Spikes, DO  STOOL SOFTENER 100 MG capsule Take 100 mg by mouth 2 (two) times daily.  01/22/16   Historical Provider, MD      PHYSICAL EXAMINATION:   VITAL SIGNS: Blood pressure (!) 105/59, pulse 87, temperature 98.7 F (37.1 C), temperature source Oral, resp. rate (!) 25, height 5\' 11"  (1.803 m), weight 240 lb (108.9 kg), SpO2 93 %.  GENERAL:  83 y.o.-year-old patient lying in the bed with no acute distress.  EYES: Pupils equal, round, reactive to light and accommodation. No scleral icterus. Extraocular muscles intact.  HEENT: Head atraumatic, normocephalic. Oropharynx and nasopharynx clear.  NECK:  Supple, no jugular venous distention. No thyroid enlargement, no tenderness.  LUNGS: Normal breath sounds bilaterally, no wheezing, rales,rhonchi or crepitation. No use of accessory muscles of respiration.  CARDIOVASCULAR: Irregular irregular. No murmurs, rubs, or gallops.  ABDOMEN: Soft, nontender, nondistended. Bowel sounds present. No organomegaly or mass.  EXTREMITIES: No pedal edema, cyanosis, or clubbing.  NEUROLOGIC: Cranial nerves II through XII are intact. Muscle strength 5/5 in all extremities. Sensation intact. Gait not checked.  PSYCHIATRIC: The patient is alert and oriented x 3.  SKIN: No obvious rash, lesion, or ulcer.   LABORATORY PANEL:   CBC  Recent Labs Lab 06/03/16 0837  WBC 8.8  HGB 14.0  HCT 40.9  PLT 116*  MCV 95.3  MCH 32.6  MCHC 34.2  RDW 15.7*   ------------------------------------------------------------------------------------------------------------------  Chemistries   Recent Labs Lab 06/03/16 0837  NA 138  K 4.5  CL 102  CO2 30  GLUCOSE 148*  BUN 25*  CREATININE 1.76*   CALCIUM 8.5*  AST 28  ALT 18  ALKPHOS 116  BILITOT 1.0   ------------------------------------------------------------------------------------------------------------------ estimated creatinine clearance is 39.2 mL/min (by C-G formula based on SCr of 1.76 mg/dL (H)). ------------------------------------------------------------------------------------------------------------------  Recent Labs  06/03/16 0837  TSH 1.973     Coagulation profile  Recent Labs Lab 06/03/16 1027  INR 1.24   ------------------------------------------------------------------------------------------------------------------- No results for input(s): DDIMER in the last 72 hours. -------------------------------------------------------------------------------------------------------------------  Cardiac Enzymes  Recent Labs Lab 06/03/16 0837  TROPONINI 0.03*   ------------------------------------------------------------------------------------------------------------------ Invalid input(s): POCBNP  ---------------------------------------------------------------------------------------------------------------  Urinalysis    Component Value Date/Time   COLORURINE YELLOW (A) 06/03/2016 1035   APPEARANCEUR CLEAR (A) 06/03/2016 1035   LABSPEC 1.021 06/03/2016 1035   PHURINE 7.0 06/03/2016 1035   GLUCOSEU NEGATIVE 06/03/2016 1035   HGBUR NEGATIVE 06/03/2016 1035   BILIRUBINUR NEGATIVE 06/03/2016 1035   KETONESUR NEGATIVE 06/03/2016 1035   PROTEINUR NEGATIVE 06/03/2016 1035   NITRITE NEGATIVE 06/03/2016 1035   LEUKOCYTESUR NEGATIVE 06/03/2016 1035     RADIOLOGY: Dg Abdomen 1 View  Result  Date: 06/03/2016 CLINICAL DATA:  Abdominal pain. EXAM: ABDOMEN - 1 VIEW COMPARISON:  CT 05/12/2016. FINDINGS: Soft tissue structures are unremarkable. Distended loops of small bowel noted. Colonic gas pattern is normal. No free air. Degenerative changes thoracolumbar spine. Left base subsegmental atelectasis.  IMPRESSION: 1. Distended loops of small bowel noted. Colonic gas pattern normal. Findings suggest mild adynamic ileus. 2. Left base subsegmental atelectasis again noted. Electronically Signed   By: Marcello Moores  Register   On: 06/03/2016 10:18   Ct Angio Chest Pe W And/or Wo Contrast  Result Date: 06/03/2016 CLINICAL DATA:  83 year old diabetic male with esophageal cancer and dementia presenting with abdominal pain for the past 2 days. Right-sided chest pain. Atrial fibrillation. Initial encounter. EXAM: CT ANGIOGRAPHY CHEST WITH CONTRAST TECHNIQUE: Multidetector CT imaging of the chest was performed using the standard protocol during bolus administration of intravenous contrast. Multiplanar CT image reconstructions and MIPs were obtained to evaluate the vascular anatomy. CONTRAST:  60 cc Isovue 370. COMPARISON:  05/12/2016. FINDINGS: Cardiovascular: No pulmonary embolus or aortic dissection. Right central line tip right atrium. Non-opacified blood adjacent to the central line probably represents mixing of non-opacified blood rather than thrombus. Cardiomegaly. Small pericardial effusion/ pericardial thickening. Coronary artery calcifications. Mediastinum/Nodes: Esophageal wall thickening (most notable superior thoracic esophagus) similar to prior exam. No adenopathy. Lungs/Pleura: Interval development of small to moderate-size left-sided pleural effusion. Adjacent passive atelectasis. Post therapy changes medial aspect right lung. Upper Abdomen: No acute abnormality. Right renal mass noted on prior exam not of imaged on the current exam. Musculoskeletal: Thoracic kyphosis with flowing confluent anterior osteophyte without destructive lesion. Review of the MIP images confirms the above findings. IMPRESSION: No pulmonary embolus or aortic dissection. Interval development of small to moderate-size left-sided pleural effusion. Adjacent passive atelectasis. Post therapy changes medial aspect right lung. Esophageal wall  thickening (most notable superior thoracic esophagus) similar to prior exam. Cardiomegaly. Small pericardial effusion/pericardial thickening. Coronary artery calcifications. Electronically Signed   By: Genia Del M.D.   On: 06/03/2016 10:45   US Venous Img Lower Bilateral  Result Date: 06/03/2016 CLINICAL DATA:  Lower extremity pain for about 2 days EXAM: BILATERAL LOWER EXTREMITY VENOUS DOPPLER ULTRASOUND TECHNIQUE: Gray-scale sonography with graded compression, as well as color Doppler and duplex ultrasound were performed to evaluate the lower extremity deep venous systems from the level of the common femoral vein and including the common femoral, femoral, profunda femoral, popliteal and calf veins including the posterior tibial, peroneal and gastrocnemius veins when visible. The superficial great saphenous vein was also interrogated. Spectral Doppler was utilized to evaluate flow at rest and with distal augmentation maneuvers in the common femoral, femoral and popliteal veins. COMPARISON:  None. FINDINGS: RIGHT LOWER EXTREMITY Common Femoral Vein: No evidence of thrombus. Normal compressibility, respiratory phasicity and response to augmentation. Saphenofemoral Junction: No evidence of thrombus. Normal compressibility and flow on color Doppler imaging. Profunda Femoral Vein: No evidence of thrombus. Normal compressibility and flow on color Doppler imaging. Femoral Vein: No evidence of thrombus. Normal compressibility, respiratory phasicity and response to augmentation. Popliteal Vein: No evidence of thrombus. Normal compressibility, respiratory phasicity and response to augmentation. Calf Veins: No evidence of thrombus. Normal compressibility and flow on color Doppler imaging. Superficial Great Saphenous Vein: No evidence of thrombus. Normal compressibility and flow on color Doppler imaging. Venous Reflux:  None. Other Findings:  None. LEFT LOWER EXTREMITY Common Femoral Vein: No evidence of thrombus.  Normal compressibility, respiratory phasicity and response to augmentation. Saphenofemoral Junction: No evidence of thrombus. Normal compressibility  and flow on color Doppler imaging. Profunda Femoral Vein: No evidence of thrombus. Normal compressibility and flow on color Doppler imaging. Femoral Vein: No evidence of thrombus. Normal compressibility, respiratory phasicity and response to augmentation. Popliteal Vein: No evidence of thrombus. Normal compressibility, respiratory phasicity and response to augmentation. Calf Veins: No evidence of thrombus. Normal compressibility and flow on color Doppler imaging. Superficial Great Saphenous Vein: No evidence of thrombus. Normal compressibility and flow on color Doppler imaging. Venous Reflux:  None. Other Findings:  None. IMPRESSION: No evidence of deep venous thrombosis bilateral lower extremity. Electronically Signed   By: Lahoma Crocker M.D.   On: 06/03/2016 10:04    EKG: Orders placed or performed during the hospital encounter of 06/03/16  . ED EKG  . ED EKG  . EKG 12-Lead  . EKG 12-Lead  . ED EKG  . ED EKG  . ED EKG  . ED EKG    IMPRESSION AND PLAN: Patient is a 83 year old presented with abdominal pain  1. Abdominal pain possibly related to adynamic ileus however his symptoms have resolved now I will place patient on MiraLAX monitor for any further worsening symptoms  2. Pleural effusion Differential diagnoses include radiation-induced, could be related to esophageal cancer Although fluid appears not to be that significant I will see if radiology can try to drain this  3. Atrial fibrillation which is new for the patient We'll ask cardiology to see obtain echocardiogram of the heart  4. Diabetes type II  I will place on ssi  5. Lipidemia continue Lipitor  6. Miscellaneous Lovenox for DVT prophylaxis  All the records are reviewed and case discussed with ED provider. Management plans discussed with the patient, family and they are in  agreement.  CODE STATUS: Code Status History    This patient does not have a recorded code status. Please follow your organizational policy for patients in this situation.       TOTAL TIME TAKING CARE OF THIS PATIENT:25minutes.    Dustin Flock M.D on 06/03/2016 at 12:24 PM  Between 7am to 6pm - Pager - [phone]  After 6pm go to [email or web address] - password [email or web address] [facility]  Office  [phone]  CC: Primary care physician; Coral Spikes, DO

## 2016-06-03 NOTE — ED Notes (Signed)
Waiting on admit bed. NAD. Family at bedside. Meal tray ordered

## 2016-06-03 NOTE — ED Provider Notes (Addendum)
[facility] Emergency Department Provider Note  ____________________________________________  Time seen: Approximately 8:59 AM  I have reviewed the triage vital signs and the nursing notes.   HISTORY  Chief Complaint Abdominal Pain    HPI Miguel [name] is a 83 y.o. male with a history of esophageal cancer (last radiation and chemo 11/17), COPD, DM2, presenting with generalized weakness,epigastric pain, mild headache. The patient has a history of dementia although he is able to answer basic questions. His wife with whom he lives, and his son accompany him today. Per their report, the patient has been in his usual state of health until this morning when he had jaw eyes weakness and difficulty getting out of bed. Eating breakfast, he complained of epigastric pain, but cannot give me more information about the quality or character of his pain. At this time, he is denying any abdominal discomfort. He did not have associated nausea vomiting or diarrhea. His last bowel movement was this morning which was normal. He did feel warm to his wife. In addition, the patient had a mild headache, which she does not have at this time. The patient is also been having a cough that is nonproductive for the last several weeks, without any associated congestion or rhinorrhea, sore throat, or ear pain. At this time, the patient's only symptom is generalized weakness, although when instructed to take deep breaths for my pulmonary auscultation, he reports pain that is diffuse in the chest. On further questioning, the patient also notes left greater than right bilateral lower extremity pain, including discomfort, but he cannot elucidate whether this is his typical peripheral neuropathy.   Past Medical History:  Diagnosis Date  . Alcoholism ([place])   . Anxiety   . COPD (chronic obstructive pulmonary disease) ([place])   . Dementia 03/18/2016  . DM type 2 with diabetic peripheral neuropathy ([place])  03/18/2016  . Esophageal cancer ([place])   . GERD (gastroesophageal reflux disease) 03/18/2016  . Hyperlipidemia   . Macular degeneration     Patient Active Problem List   Diagnosis Date Noted  . Right renal mass 05/12/2016  . DM type 2 with diabetic peripheral neuropathy ([place]) 03/18/2016  . COPD (chronic obstructive pulmonary disease) ([place]) 03/18/2016  . Anxiety 03/18/2016  . Dementia 03/18/2016  . GERD (gastroesophageal reflux disease) 03/18/2016  . Macular degeneration 03/18/2016  . Intertrigo 03/18/2016  . 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 BACK SURGERY    . CATARACT EXTRACTION    . KNEE SURGERY      Current Outpatient Rx  . Order #: [id number] Class: Historical Med  . Order #: [id number] Class: Normal  . Order #: [id number] Class: Historical Med  . Order #: [id number] Class: Normal  . Order #: [id number] Class: Normal  . Order #: [id number] Class: Normal  . Order #: [id number] Class: Historical Med  . Order #: [id number] Class: Normal  . Order #: [id number] Class: Historical Med  . Order #: [id number] Class: Normal  . Order #: [id number] Class: Historical Med  . Order #: [id number] Class: Normal  . Order #: [id number] Class: Historical Med    Allergies Patient has no known allergies.  Family History  Problem Relation Age of Onset  . Ovarian cancer Sister   . Throat cancer Brother   . Diabetes Mother   . Kidney disease Father   . Prostate cancer Neg Hx   . Kidney cancer Neg Hx     Social History Social History  Substance Use Topics  .  Smoking status: Former Research scientist (life sciences)  . Smokeless tobacco: Never Used  . Alcohol use No    Review of Systems Constitutional: No fever/chills, although warm to touch by his wife this morning. Positive general malaise and weakness. No diaphoresis. No lightheadedness or syncope. Eyes: No eye discharge. ENT: No sore throat. No congestion or rhinorrhea. No ear pain. Cardiovascular:  Positive chest pain with deep breaths on my examination although the patient does not have this is a chief complaint. Denies palpitations. Respiratory: Denies shortness of breath.  Positive nonproductive cough. Gastrointestinal: Positive epigastric abdominal pain.  No nausea, no vomiting.  No diarrhea.  No constipation. Genitourinary: Negative for dysuria. Musculoskeletal: Negative for back pain. Skin: Negative for rash. Neurological: Positive for headache. No focal numbness, tingling or weakness.   10-point ROS otherwise negative.  ____________________________________________   PHYSICAL EXAM:  VITAL SIGNS: ED Triage Vitals [06/03/16 0833]  Enc Vitals Group     BP 107/65     Pulse Rate 95     Resp 20     Temp 100.1 F (37.8 C)     Temp Source Oral     SpO2 100 %     Weight 240 lb (108.9 kg)     Height 5\' 11"  (1.803 m)     Head Circumference      Peak Flow      Pain Score 0     Pain Loc      Pain Edu?      Excl. in [place]?     Constitutional: The patient is alert and able to answer basic questions although he does have dementia. Overall, he is chronically ill appearing but nontoxic. Eyes: Conjunctivae are normal.  EOMI. No scleral icterus. No eye discharge. Head: Atraumatic. Nose: No congestion/rhinnorhea. Mouth/Throat: Mucous membranes are mildly dry.  Neck: No stridor.  Supple.  No meningismus. Cardiovascular: Normal rate, irregular rhythm. No murmurs, rubs or gallops.  Respiratory: Normal respiratory effort.  No accessory muscle use or retractions. Lungs CTAB.  No wheezes, rales or ronchi. Gastrointestinal: Obese. Soft, nontender and nondistended.  No guarding or rebound.  No peritoneal signs. Old G-tube scar in the left upper quadrant. Musculoskeletal: Bilateral nonpitting symmetric lower extremity edema. Tenderness to palpation in the left greater than right calf without palpable cords. Negative Homans sign.  Neurologic:  Alert. Positive expressive aphasia, which is  chronic and unchanged according to the patient's family members Speech is occasionally slurred.  Face and smile are symmetric.  EOMI.  Moves all extremities well. Skin:  Skin is warm, dry and intact. No rash noted. Psychiatric: Mood and affect are normal.   ____________________________________________   LABS (all labs ordered are listed, but only abnormal results are displayed)  Labs Reviewed  COMPREHENSIVE METABOLIC PANEL - Abnormal; Notable for the following:       Result Value   Glucose, Bld 148 (*)    BUN 25 (*)    Creatinine, Ser 1.76 (*)    Calcium 8.5 (*)    GFR calc non Af Amer 34 (*)    GFR calc Af Amer 39 (*)    All other components within normal limits  CBC - Abnormal; Notable for the following:    RBC 4.30 (*)    RDW 15.7 (*)    Platelets 116 (*)    All other components within normal limits  URINALYSIS, COMPLETE (UACMP) WITH MICROSCOPIC - Abnormal; Notable for the following:    Color, Urine YELLOW (*)    APPearance CLEAR (*)  All other components within normal limits  TROPONIN I - Abnormal; Notable for the following:    Troponin I 0.03 (*)    All other components within normal limits  PROTIME-INR - Abnormal; Notable for the following:    Prothrombin Time 15.7 (*)    All other components within normal limits  LIPASE, BLOOD  INFLUENZA PANEL BY PCR (TYPE A & B)  TSH  APTT   ____________________________________________  EKG  ED ECG REPORT I, Eula Listen, the attending physician, personally viewed and interpreted this ECG.   Date: 06/03/2016  EKG Time: 820  Rate: 91  Rhythm: irregular rhythm with poor baseline tracing; it is hard to say whether the patient has underlying p waves with sinus arrythmia or afib; will repeat EKG.  Axis: normal  Intervals:none  ST&T Change: Nonspecific T-wave inversion in V1. No ST elevation.  ____________________________________________  [id number]  Dg Abdomen 1 View  Result Date: 06/03/2016 CLINICAL DATA:   Abdominal pain. EXAM: ABDOMEN - 1 VIEW COMPARISON:  CT 05/12/2016. FINDINGS: Soft tissue structures are unremarkable. Distended loops of small bowel noted. Colonic gas pattern is normal. No free air. Degenerative changes thoracolumbar spine. Left base subsegmental atelectasis. IMPRESSION: 1. Distended loops of small bowel noted. Colonic gas pattern normal. Findings suggest mild adynamic ileus. 2. Left base subsegmental atelectasis again noted. Electronically Signed   By: Marcello Moores  Register   On: 06/03/2016 10:18   Ct Angio Chest Pe W And/or Wo Contrast  Result Date: 06/03/2016 CLINICAL DATA:  83 year old diabetic male with esophageal cancer and dementia presenting with abdominal pain for the past 2 days. Right-sided chest pain. Atrial fibrillation. Initial encounter. EXAM: CT ANGIOGRAPHY CHEST WITH CONTRAST TECHNIQUE: Multidetector CT imaging of the chest was performed using the standard protocol during bolus administration of intravenous contrast. Multiplanar CT image reconstructions and MIPs were obtained to evaluate the vascular anatomy. CONTRAST:  60 cc Isovue 370. COMPARISON:  05/12/2016. FINDINGS: Cardiovascular: No pulmonary embolus or aortic dissection. Right central line tip right atrium. Non-opacified blood adjacent to the central line probably represents mixing of non-opacified blood rather than thrombus. Cardiomegaly. Small pericardial effusion/ pericardial thickening. Coronary artery calcifications. Mediastinum/Nodes: Esophageal wall thickening (most notable superior thoracic esophagus) similar to prior exam. No adenopathy. Lungs/Pleura: Interval development of small to moderate-size left-sided pleural effusion. Adjacent passive atelectasis. Post therapy changes medial aspect right lung. Upper Abdomen: No acute abnormality. Right renal mass noted on prior exam not of imaged on the current exam. Musculoskeletal: Thoracic kyphosis with flowing confluent anterior osteophyte without destructive lesion.  Review of the MIP images confirms the above findings. IMPRESSION: No pulmonary embolus or aortic dissection. Interval development of small to moderate-size left-sided pleural effusion. Adjacent passive atelectasis. Post therapy changes medial aspect right lung. Esophageal wall thickening (most notable superior thoracic esophagus) similar to prior exam. Cardiomegaly. Small pericardial effusion/pericardial thickening. Coronary artery calcifications. Electronically Signed   By: Genia Del M.D.   On: 06/03/2016 10:45   US Venous Img Lower Bilateral  Result Date: 06/03/2016 CLINICAL DATA:  Lower extremity pain for about 2 days EXAM: BILATERAL LOWER EXTREMITY VENOUS DOPPLER ULTRASOUND TECHNIQUE: Gray-scale sonography with graded compression, as well as color Doppler and duplex ultrasound were performed to evaluate the lower extremity deep venous systems from the level of the common femoral vein and including the common femoral, femoral, profunda femoral, popliteal and calf veins including the posterior tibial, peroneal and gastrocnemius veins when visible. The superficial great saphenous vein was also interrogated. Spectral Doppler was utilized to evaluate flow at  rest and with distal augmentation maneuvers in the common femoral, femoral and popliteal veins. COMPARISON:  None. FINDINGS: RIGHT LOWER EXTREMITY Common Femoral Vein: No evidence of thrombus. Normal compressibility, respiratory phasicity and response to augmentation. Saphenofemoral Junction: No evidence of thrombus. Normal compressibility and flow on color Doppler imaging. Profunda Femoral Vein: No evidence of thrombus. Normal compressibility and flow on color Doppler imaging. Femoral Vein: No evidence of thrombus. Normal compressibility, respiratory phasicity and response to augmentation. Popliteal Vein: No evidence of thrombus. Normal compressibility, respiratory phasicity and response to augmentation. Calf Veins: No evidence of thrombus. Normal  compressibility and flow on color Doppler imaging. Superficial Great Saphenous Vein: No evidence of thrombus. Normal compressibility and flow on color Doppler imaging. Venous Reflux:  None. Other Findings:  None. LEFT LOWER EXTREMITY Common Femoral Vein: No evidence of thrombus. Normal compressibility, respiratory phasicity and response to augmentation. Saphenofemoral Junction: No evidence of thrombus. Normal compressibility and flow on color Doppler imaging. Profunda Femoral Vein: No evidence of thrombus. Normal compressibility and flow on color Doppler imaging. Femoral Vein: No evidence of thrombus. Normal compressibility, respiratory phasicity and response to augmentation. Popliteal Vein: No evidence of thrombus. Normal compressibility, respiratory phasicity and response to augmentation. Calf Veins: No evidence of thrombus. Normal compressibility and flow on color Doppler imaging. Superficial Great Saphenous Vein: No evidence of thrombus. Normal compressibility and flow on color Doppler imaging. Venous Reflux:  None. Other Findings:  None. IMPRESSION: No evidence of deep venous thrombosis bilateral lower extremity. Electronically Signed   By: Lahoma Crocker M.D.   On: 06/03/2016 10:04    ____________________________________________   PROCEDURES  Procedure(s) performed: None  Procedures  Critical Care performed: No ____________________________________________   INITIAL IMPRESSION / ASSESSMENT AND PLAN / ED COURSE  Pertinent labs & imaging results that were available during my care of the patient were reviewed by me and considered in my medical decision making (see chart for details).  83 y.o. male with a history of esophageal cancer, COPD, DM 2, presenting with general malaise, resolved epigastric pain and headache, now with pleuritic chest pain with deep breaths. Overall, the patient has multiple multisystem complaints today. I do not see any evidence of acute stroke as the patient has stable  expressive aphasia but no other acute abnormalities. I will consider ACS or MI, new onset atrial fibrillation although this still remains to be elucidated with a repeat EKG, or possible PE given the pleuritic nature of his chest pain and his new calf pain with lower extremity swelling. CT angiogram has been ordered. In addition, will check for anemia, I disturbance, or UTI which are also possible causes of his jaundice weakness. Given his cough, we'll rule out pneumonia, or consider viral process or COPD exacerbation although he is maintaining good oxygen saturation on room air at this time. Plan reevaluation after diagnostic and therapeutic intervention.  ----------------------------------------- 11:24 AM on 06/03/2016 -----------------------------------------  The patient abdominal xray is consistent with ileus, as he has dilated small loops of bowel but normal colonic gas. There is no indication for further imaging at this point. His CT angios of the chest does not show PE, but he does have a left pleural effusion and a mild pericardial effusion. This will require echocardiogram follow-up, but clinically he does not have any temporal not at this point. I'm awaiting the repeat EKG, but on the monitor, it does appear that he may have atrial fibrillation. The patient also has some acute on chronic renal insufficiency today. Plan admission.  ED ECG  REPORT I, Eula Listen, the attending physician, personally viewed and interpreted this ECG.   Date: 06/03/2016  EKG Time: 1131  Rate: 91  Rhythm: normal sinus rhythm  Axis: normal  Intervals:none  ST&T Change: No STEMI This EKG still has a poor baseline tracing. We will get an EKG with a walk-in EKG machine.  ED ECG REPORT I, Eula Listen, the attending physician, personally viewed and interpreted this ECG.   Date: 06/03/2016  EKG Time: 1135  Rate: 86  Rhythm: atrial fibrillation  Axis: normal  Intervals:none  ST&T Change:  Nonspecific T-wave inversions in V1 and V2. No ST elevation.   ____________________________________________  FINAL CLINICAL IMPRESSION(S) / ED DIAGNOSES  Final diagnoses:  Ileus ([place])  Pleural effusion, left  Renal insufficiency  Pericardial effusion  Elevated troponin  New onset atrial fibrillation ([place])         NEW MEDICATIONS STARTED DURING THIS VISIT:  New Prescriptions   No medications on file      Eula Listen, MD 06/03/16 1127    Eula Listen, MD 06/03/16 1139

## 2016-06-03 NOTE — ED Notes (Signed)
Secretary called bed placement to check on room assignment and they were concerned about new afib and soft pressure. Spoke with dr patel, he is ok with pt on med surg with cardiac monitoring because not treating afib.  Talked to bed placement again and informed them dr patel ok with med surg. They will assign bed

## 2016-06-03 NOTE — Procedures (Signed)
[place] Left thoracentesis No complication No blood loss. See complete dictation in [facility]. CXR to follow

## 2016-06-04 DIAGNOSIS — J90 Pleural effusion, not elsewhere classified: Secondary | ICD-10-CM | POA: Diagnosis not present

## 2016-06-04 LAB — GLUCOSE, CAPILLARY
GLUCOSE-CAPILLARY: 99 mg/dL (ref 65–99)
GLUCOSE-CAPILLARY: 139 mg/dL — AB (ref 65–99)
GLUCOSE-CAPILLARY: 99 mg/dL (ref 65–99)
GLUCOSE-CAPILLARY: 116 mg/dL — AB (ref 65–99)

## 2016-06-04 LAB — ECHOCARDIOGRAM COMPLETE
Height: 71 in
WEIGHTICAEL: 3840 [oz_av]

## 2016-06-04 LAB — TROPONIN I: Troponin I: <0.03 ng/mL (ref <0.03)

## 2016-06-04 LAB — CYTOLOGY - NON PAP

## 2016-06-04 NOTE — Progress Notes (Signed)
[facility] Cardiology [facility] Encounter Note  Patient: Miguel [name] / Admit Date: 06/03/2016 / Date of Encounter: 06/04/2016, 7:46 AM   Subjective: Breathing is much better today. Less shortness of breath and cough. No evidence of heart failure type symptoms today  Review of Systems: Positive for: Shortness of breath Negative for: Vision change, hearing change, syncope, dizziness, nausea, vomiting,diarrhea, bloody stool, stomach pain, cough, congestion, diaphoresis, urinary frequency, urinary pain,skin lesions, skin rashes Others previously listed  Objective: Telemetry: Atrial fibrillation with controlled ventricular rate Physical Exam: Blood pressure 109/64, pulse 87, temperature 98 F (36.7 C), temperature source Oral, resp. rate 14, height 5\' 11"  (1.803 m), weight 108.9 kg (240 lb), SpO2 90 %. Body mass index is 33.47 kg/m. General: Well developed, well nourished, in no acute distress. Head: Normocephalic, atraumatic, sclera non-icteric, no xanthomas, nares are without discharge. Neck: No apparent masses Lungs: Normal respirations with some wheezes, few rhonchi, no rales , few crackles   Heart: Irregular rate and rhythm, normal S1 S2, no murmur, no rub, no gallop, PMI is normal size and placement, carotid upstroke normal without bruit, jugular venous pressure normal Abdomen: Soft, non-tender, non-distended with normoactive bowel sounds. No hepatosplenomegaly. Abdominal aorta is normal size without bruit Extremities: Trace edema, no clubbing, no cyanosis, no ulcers,  Peripheral: 2+ radial, 2+ femoral, 2+ dorsal pedal pulses Neuro: Alert and oriented. Moves all extremities spontaneously. Psych:  Responds to questions appropriately with a normal affect.   Intake/Output Summary (Last 24 hours) at 06/04/16 0746 Last data filed at 06/03/16 1849  Gross per 24 hour  Intake              270 ml  Output                0 ml  Net              270 ml    Inpatient Medications:  .  ALPRAZolam  0.5 mg Oral TID  . aspirin  81 mg Oral Daily  . atorvastatin  40 mg Oral Daily  . enoxaparin (LOVENOX) injection  40 mg Subcutaneous Q24H  . furosemide  20 mg Oral Daily  . gabapentin  300 mg Oral BID  . insulin aspart  0-9 Units Subcutaneous TID WC  . metoprolol tartrate  12.5 mg Oral BID  . mometasone-formoterol  2 puff Inhalation BID  . pantoprazole  40 mg Oral Daily  . polyethylene glycol  17 g Oral Daily  . sodium chloride flush  3 mL Intravenous Q12H   Infusions:   Labs:  Recent Labs  06/03/16 0837  NA 138  K 4.5  CL 102  CO2 30  GLUCOSE 148*  BUN 25*  CREATININE 1.76*  CALCIUM 8.5*    Recent Labs  06/03/16 0837  AST 28  ALT 18  ALKPHOS 116  BILITOT 1.0  PROT 6.8  ALBUMIN 3.7    Recent Labs  06/03/16 0837  WBC 8.8  HGB 14.0  HCT 40.9  MCV 95.3  PLT 116*    Recent Labs  06/03/16 0837 06/03/16 1547 06/03/16 2115 06/04/16 0336  TROPONINI 0.03* 0.03* 0.03* <0.03   Invalid input(s): POCBNP No results for input(s): HGBA1C in the last 72 hours.   Weights: Filed Weights   06/03/16 [id number]  Weight: 108.9 kg (240 lb)     Radiology/Studies:  Dg Abdomen 1 View  Result Date: 06/03/2016 CLINICAL DATA:  Abdominal pain. EXAM: ABDOMEN - 1 VIEW COMPARISON:  CT 05/12/2016. FINDINGS: Soft tissue structures  are unremarkable. Distended loops of small bowel noted. Colonic gas pattern is normal. No free air. Degenerative changes thoracolumbar spine. Left base subsegmental atelectasis. IMPRESSION: 1. Distended loops of small bowel noted. Colonic gas pattern normal. Findings suggest mild adynamic ileus. 2. Left base subsegmental atelectasis again noted. Electronically Signed   By: Marcello Moores  Register   On: 06/03/2016 10:18   Ct Chest W Contrast  Result Date: 05/12/2016 CLINICAL DATA:  Esophageal cancer. EXAM: CT CHEST, ABDOMEN, AND PELVIS WITH CONTRAST TECHNIQUE: Multidetector CT imaging of the chest, abdomen and pelvis was performed following the  standard protocol during bolus administration of intravenous contrast. CONTRAST:  24mL ISOVUE-300 IOPAMIDOL (ISOVUE-300) INJECTION 61% COMPARISON:  02/11/2016. FINDINGS: CT CHEST FINDINGS Cardiovascular: Right IJ Port-A-Cath terminates in the right atrium. Atherosclerotic calcification of the arterial vasculature, including coronary arteries. Heart is at the upper limits of normal in size to mildly enlarged. No pericardial effusion. Mediastinum/Nodes: No pathologically enlarged mediastinal, hilar or axillary lymph nodes. Esophageal wall may be slightly thickened. The esophagus is dilated and air-filled proximally. Lungs/Pleura: Mild centrilobular emphysema. Small area of subpleural airspace consolidation in the medial right upper lobe is grossly stable. Mild dependent atelectasis in the left lower lobe. Lungs are otherwise clear. Airway is unremarkable. Musculoskeletal: Flowing anterior osteophytosis in the thoracic spine. No worrisome lytic or sclerotic lesions. Old right rib fractures. CT ABDOMEN PELVIS FINDINGS Hepatobiliary: Liver is unremarkable. Numerous stones are seen in the gallbladder. No biliary ductal dilatation. Pancreas: Negative. Spleen: Negative. Adrenals/Urinary Tract: Adrenal glands are unremarkable. A heterogeneous 3.2 cm mass is seen in the lower pole right kidney. Additional low-attenuation lesions in the kidneys measure up to 3.8 cm and 23 Hounsfield units on the left, difficult to characterize as simple cysts. Ureters are decompressed. Presumed TURP defect. Bladder is otherwise grossly unremarkable. Stomach/Bowel: Stomach, small bowel and colon are unremarkable. Appendix is not readily visualized. Vascular/Lymphatic: Atherosclerotic calcification of the arterial vasculature without abdominal aortic aneurysm. No pathologically enlarged lymph nodes. Reproductive:  There appears to be a TURP defect in the bladder. Other: Small bilateral inguinal hernias contain fat. No free fluid. Mesenteries and  peritoneum are unremarkable. Musculoskeletal: No worrisome lytic or sclerotic lesions. Degenerative changes are seen in the spine. Postoperative or posttraumatic changes in the right iliac wing. IMPRESSION: 1. Mild esophageal wall thickening, as before. No evidence of metastatic disease. 2. Small area of subpleural consolidation in the medial right upper lobe may be treatment related. 3. Heterogeneous right renal mass, highly worrisome for renal cell carcinoma. These results will be called to the ordering clinician or representative by the Radiologist Assistant, and communication documented in the PACS or zVision Dashboard. 4. Aortic atherosclerosis (ICD10-170.0). Coronary artery calcification. 5. Cholelithiasis. Electronically Signed   By: Lorin Picket M.D.   On: 05/12/2016 14:00   Ct Angio Chest Pe W And/or Wo Contrast  Result Date: 06/03/2016 CLINICAL DATA:  83 year old diabetic male with esophageal cancer and dementia presenting with abdominal pain for the past 2 days. Right-sided chest pain. Atrial fibrillation. Initial encounter. EXAM: CT ANGIOGRAPHY CHEST WITH CONTRAST TECHNIQUE: Multidetector CT imaging of the chest was performed using the standard protocol during bolus administration of intravenous contrast. Multiplanar CT image reconstructions and MIPs were obtained to evaluate the vascular anatomy. CONTRAST:  60 cc Isovue 370. COMPARISON:  05/12/2016. FINDINGS: Cardiovascular: No pulmonary embolus or aortic dissection. Right central line tip right atrium. Non-opacified blood adjacent to the central line probably represents mixing of non-opacified blood rather than thrombus. Cardiomegaly. Small pericardial effusion/ pericardial thickening. Coronary  artery calcifications. Mediastinum/Nodes: Esophageal wall thickening (most notable superior thoracic esophagus) similar to prior exam. No adenopathy. Lungs/Pleura: Interval development of small to moderate-size left-sided pleural effusion. Adjacent  passive atelectasis. Post therapy changes medial aspect right lung. Upper Abdomen: No acute abnormality. Right renal mass noted on prior exam not of imaged on the current exam. Musculoskeletal: Thoracic kyphosis with flowing confluent anterior osteophyte without destructive lesion. Review of the MIP images confirms the above findings. IMPRESSION: No pulmonary embolus or aortic dissection. Interval development of small to moderate-size left-sided pleural effusion. Adjacent passive atelectasis. Post therapy changes medial aspect right lung. Esophageal wall thickening (most notable superior thoracic esophagus) similar to prior exam. Cardiomegaly. Small pericardial effusion/pericardial thickening. Coronary artery calcifications. Electronically Signed   By: Genia Del M.D.   On: 06/03/2016 10:45   Ct Abdomen Pelvis W Contrast  Result Date: 05/12/2016 CLINICAL DATA:  Esophageal cancer. EXAM: CT CHEST, ABDOMEN, AND PELVIS WITH CONTRAST TECHNIQUE: Multidetector CT imaging of the chest, abdomen and pelvis was performed following the standard protocol during bolus administration of intravenous contrast. CONTRAST:  15mL ISOVUE-300 IOPAMIDOL (ISOVUE-300) INJECTION 61% COMPARISON:  02/11/2016. FINDINGS: CT CHEST FINDINGS Cardiovascular: Right IJ Port-A-Cath terminates in the right atrium. Atherosclerotic calcification of the arterial vasculature, including coronary arteries. Heart is at the upper limits of normal in size to mildly enlarged. No pericardial effusion. Mediastinum/Nodes: No pathologically enlarged mediastinal, hilar or axillary lymph nodes. Esophageal wall may be slightly thickened. The esophagus is dilated and air-filled proximally. Lungs/Pleura: Mild centrilobular emphysema. Small area of subpleural airspace consolidation in the medial right upper lobe is grossly stable. Mild dependent atelectasis in the left lower lobe. Lungs are otherwise clear. Airway is unremarkable. Musculoskeletal: Flowing anterior  osteophytosis in the thoracic spine. No worrisome lytic or sclerotic lesions. Old right rib fractures. CT ABDOMEN PELVIS FINDINGS Hepatobiliary: Liver is unremarkable. Numerous stones are seen in the gallbladder. No biliary ductal dilatation. Pancreas: Negative. Spleen: Negative. Adrenals/Urinary Tract: Adrenal glands are unremarkable. A heterogeneous 3.2 cm mass is seen in the lower pole right kidney. Additional low-attenuation lesions in the kidneys measure up to 3.8 cm and 23 Hounsfield units on the left, difficult to characterize as simple cysts. Ureters are decompressed. Presumed TURP defect. Bladder is otherwise grossly unremarkable. Stomach/Bowel: Stomach, small bowel and colon are unremarkable. Appendix is not readily visualized. Vascular/Lymphatic: Atherosclerotic calcification of the arterial vasculature without abdominal aortic aneurysm. No pathologically enlarged lymph nodes. Reproductive:  There appears to be a TURP defect in the bladder. Other: Small bilateral inguinal hernias contain fat. No free fluid. Mesenteries and peritoneum are unremarkable. Musculoskeletal: No worrisome lytic or sclerotic lesions. Degenerative changes are seen in the spine. Postoperative or posttraumatic changes in the right iliac wing. IMPRESSION: 1. Mild esophageal wall thickening, as before. No evidence of metastatic disease. 2. Small area of subpleural consolidation in the medial right upper lobe may be treatment related. 3. Heterogeneous right renal mass, highly worrisome for renal cell carcinoma. These results will be called to the ordering clinician or representative by the Radiologist Assistant, and communication documented in the PACS or zVision Dashboard. 4. Aortic atherosclerosis (ICD10-170.0). Coronary artery calcification. 5. Cholelithiasis. Electronically Signed   By: Lorin Picket M.D.   On: 05/12/2016 14:00   US Venous Img Lower Bilateral  Result Date: 06/03/2016 CLINICAL DATA:  Lower extremity pain for  about 2 days EXAM: BILATERAL LOWER EXTREMITY VENOUS DOPPLER ULTRASOUND TECHNIQUE: Gray-scale sonography with graded compression, as well as color Doppler and duplex ultrasound were performed to evaluate the lower  extremity deep venous systems from the level of the common femoral vein and including the common femoral, femoral, profunda femoral, popliteal and calf veins including the posterior tibial, peroneal and gastrocnemius veins when visible. The superficial great saphenous vein was also interrogated. Spectral Doppler was utilized to evaluate flow at rest and with distal augmentation maneuvers in the common femoral, femoral and popliteal veins. COMPARISON:  None. FINDINGS: RIGHT LOWER EXTREMITY Common Femoral Vein: No evidence of thrombus. Normal compressibility, respiratory phasicity and response to augmentation. Saphenofemoral Junction: No evidence of thrombus. Normal compressibility and flow on color Doppler imaging. Profunda Femoral Vein: No evidence of thrombus. Normal compressibility and flow on color Doppler imaging. Femoral Vein: No evidence of thrombus. Normal compressibility, respiratory phasicity and response to augmentation. Popliteal Vein: No evidence of thrombus. Normal compressibility, respiratory phasicity and response to augmentation. Calf Veins: No evidence of thrombus. Normal compressibility and flow on color Doppler imaging. Superficial Great Saphenous Vein: No evidence of thrombus. Normal compressibility and flow on color Doppler imaging. Venous Reflux:  None. Other Findings:  None. LEFT LOWER EXTREMITY Common Femoral Vein: No evidence of thrombus. Normal compressibility, respiratory phasicity and response to augmentation. Saphenofemoral Junction: No evidence of thrombus. Normal compressibility and flow on color Doppler imaging. Profunda Femoral Vein: No evidence of thrombus. Normal compressibility and flow on color Doppler imaging. Femoral Vein: No evidence of thrombus. Normal  compressibility, respiratory phasicity and response to augmentation. Popliteal Vein: No evidence of thrombus. Normal compressibility, respiratory phasicity and response to augmentation. Calf Veins: No evidence of thrombus. Normal compressibility and flow on color Doppler imaging. Superficial Great Saphenous Vein: No evidence of thrombus. Normal compressibility and flow on color Doppler imaging. Venous Reflux:  None. Other Findings:  None. IMPRESSION: No evidence of deep venous thrombosis bilateral lower extremity. Electronically Signed   By: Lahoma Crocker M.D.   On: 06/03/2016 10:04   Dg Chest Port 1 View  Result Date: 06/03/2016 CLINICAL DATA:  S/p left thoracentesis EXAM: PORTABLE CHEST - 1 VIEW COMPARISON:  CT from earlier the same day FINDINGS: No pneumothorax. Persisting consolidation/atelectasis laterally at the left lung base. Right lung clear. Heart size upper limits normal for technique.  Atheromatous aorta. Can not exclude small residual left pleural effusion. Stable right IJ port catheter to the cavoatrial junction. Visualized bones unremarkable. IMPRESSION: 1. No pneumothorax post left thoracentesis. Electronically Signed   By: Lucrezia Europe M.D.   On: 06/03/2016 13:53   US Thoracentesis Asp Pleural Space W/img Guide  Result Date: 06/03/2016 CLINICAL DATA:  Shortness of Breath.  Left pleural effusion on CT. EXAM: EXAM THORACENTESIS WITH ULTRASOUND TECHNIQUE: The procedure, risks (including but not limited to bleeding, infection, organ damage ), benefits, and alternatives were explained to the family and patient. Questions regarding the procedure were encouraged and answered. The spouse understands and consents to the procedure. Survey ultrasound of the left hemithorax was performed and an appropriate skin entry site was localized. Site was marked, prepped with Betadine, draped in usual sterile fashion, infiltrated locally with 1% lidocaine. The Saf-T-Centesis needle was advanced into the pleural space.  Clear yellow fluid returned. 325 mL was removed. Post procedure imaging shows no residual fluid. Sample sent for the requested laboratory studies. The patient tolerated procedure well. COMPLICATIONS: COMPLICATIONS None immediate IMPRESSION: Technically successful ultrasound-guided left thoracentesis. Follow-up chest radiograph shows no pneumothorax. Electronically Signed   By: Lucrezia Europe M.D.   On: 06/03/2016 13:55     Assessment and Recommendation  83 y.o. male with essential hypertension makes hyperlipidemia  dementia diabetes with complication of chronic kidney disease stage III having pleural effusion and pericardial effusion with shortness of breath and minimal elevated troponin most consistent with demand ischemia and new onset atrial fibrillation with controlled ventricular rate now significantly improved after thoracentesis 1. Continue furosemide orally for further risk reduction of recurrent pleural effusion and shortness of breath 2. Continue metoprolol for heart rate control of atrial fibrillation for improved cardiac efficiency in stroke volume and reduction of the heart failure type symptoms 3. Further consideration of anticoagulation for further risk reduction in stroke with atrial fibrillation depending on family wishes as well as concerns of fall risk and dementia 4. Echocardiogram pending for further evaluation of primary cause of atrial fibrillation and pleural effusion with the further treatment options from the medication management standpoint 5. No further cardiac diagnostics necessary at this time  Signed, Serafina Royals M.D. FACC

## 2016-06-04 NOTE — Evaluation (Signed)
Physical Therapy Evaluation Patient Details Name: Miguel [name] MRN: [id number] DOB: 09-30-1929 Today's Date: 06/04/2016   History of Present Illness  Miguel [name]  is a 83 y.o. male with a known history of Anxiety, COPD, dementia, diabetes type 2, esophageal cancer, GERD, hyperlipidemia, macular degeneration) brought in from home because he was complaining of abdominal pain. According to patient's daughter-in-law, his wife thought that he was not acting unusual. Patient came in with these vague symptoms he also had a low-grade temperature. His symptoms have now resolved. Patient was evaluated in the ER with a CT scan of the chest which showed pericardial effusion as well as pleural effusion. Patient also had abdominal x-ray which showed adynamic ileus. Patient also was noted to have atrial fibrillation which is new diagnosis for him. At time of PT evaluation is still in a-fib. Pt is AOx1 so history is supplemented by wife and son.   Clinical Impression  Pt admitted with above diagnosis. Pt currently with functional limitations due to the deficits listed below (see PT Problem List).  Pt with confusion but has a history of baseline dementia. He is modI for bed mobility and CGA for transfers and ambulation. He presents with some balance and strength deficits which would benefit from [facility] PT. Primary concern is insight and judgement related to his safely. His wife has cochlear implants which are off at nighttime so she is essentially deaf. Pt will benefit from skilled PT services to address deficits in strength, balance, and mobility in order to return to full function at home.     Follow Up Recommendations Home health PT    Equipment Recommendations  None recommended by PT    Recommendations for Other Services       Precautions / Restrictions Precautions Precautions: Fall Restrictions Weight Bearing Restrictions: No      Mobility  Bed Mobility Overal bed mobility: Modified  Independent             General bed mobility comments: HOB elevated, use of bed rails, increased time to perform  Transfers Overall transfer level: Needs assistance Equipment used: Rolling walker (2 wheeled) Transfers: Sit to/from Stand Sit to Stand: Min guard         General transfer comment: Pt requires increased time to come to standing. Safe hand placement demonstrated. Once upright pt is steady and stable in standing with bilateral UE support on rolling walker  Ambulation/Gait Ambulation/Gait assistance: Min guard Ambulation Distance (Feet): 180 Feet Assistive device: Rolling walker (2 wheeled) Gait Pattern/deviations: Decreased step length - right;Decreased step length - left Gait velocity: Decreased Gait velocity interpretation: Below normal speed for age/gender General Gait Details: Pt ambulates part of the way around the RN station and back. Pt in afib but HR remains around 100 bpm. SaO2>90% on room air and pt denies DOE or fatigue. Pt requires some intermittent cues for turning with walker as well as avoiding objects in hallway. No overt LOB and pt able to stabilize with bilateral UE support on rolling walker  Stairs            Wheelchair Mobility    Modified Rankin (Stroke Patients Only)       Balance Overall balance assessment: Needs assistance Sitting-balance support: No upper extremity supported Sitting balance-Leahy Scale: Good     Standing balance support: No upper extremity supported Standing balance-Leahy Scale: Fair Standing balance comment: Able to maintain feet together and apart balance without UE support. Positive Rhomberg and single leg balance is <1  second                             Pertinent Vitals/Pain Pain Assessment: No/denies pain    Home Living Family/patient expects to be discharged to:: Other (Comment) ([place]) Living Arrangements: Spouse/significant other Available Help at Discharge:  Family Type of Home: [place]: Walker - 4 wheels;Other (comment);Shower seat;Grab bars - tub/shower;Electric scooter (Three point cane)      Prior Function Level of Independence: Needs assistance   Gait / Transfers Assistance Needed: Ambulates with rollator and intermittent use of three point cane if rollator unavailable. Pt all has an Transport planner  ADL's / Homemaking Assistance Needed: Assist from family with ADLs/IADLs. Meals provided at independent living facility        Hand Dominance   Dominant Hand: Right    Extremity/Trunk Assessment   Upper Extremity Assessment Upper Extremity Assessment: Overall WFL for tasks assessed    Lower Extremity Assessment Lower Extremity Assessment: Generalized weakness       Communication   Communication: No difficulties  Cognition Arousal/Alertness: Awake/alert Behavior During Therapy: WFL for tasks assessed/performed Overall Cognitive Status: History of cognitive impairments - at baseline                 General Comments: AOx1, able to report he is in [place] with multiple choice. Disoriented to year, day, and situation    General Comments      Exercises     Assessment/Plan    PT Assessment Patient needs continued PT services  PT Problem List Decreased strength;Decreased activity tolerance;Decreased mobility;Decreased balance;Decreased cognition;Decreased safety awareness       PT Treatment Interventions DME instruction;Gait training;Functional mobility training;Therapeutic activities;Balance training;Therapeutic exercise;Cognitive remediation;Neuromuscular re-education;Patient/family education    PT Goals (Current goals can be found in the Care Plan section)  Acute Rehab PT Goals Patient Stated Goal: Return to prior function at home PT Goal Formulation: With patient/family Time For Goal Achievement: 06/18/16 Potential to Achieve Goals: Good    Frequency Min 2X/week   Barriers  to discharge Decreased caregiver support Lives with wife who has cochlear implants and doesn't hear well at night    Co-evaluation               End of Session Equipment Utilized During Treatment: Gait belt Activity Tolerance: Patient tolerated treatment well Patient left: in bed;with call bell/phone within reach;with bed alarm set;with family/visitor present   PT Visit Diagnosis: Unsteadiness on feet (R26.81);Muscle weakness (generalized) (M62.81)    Functional Assessment Tool Used: AM-PAC 6 Clicks Basic Mobility Functional Limitation: Mobility: Walking and moving around Mobility: Walking and Moving Around Current Status [id number]): At least 40 percent but less than 60 percent impaired, limited or restricted Mobility: Walking and Moving Around Goal Status [phone]): At least 20 percent but less than 40 percent impaired, limited or restricted    Time: 0855-0917 PT Time Calculation (min) (ACUTE ONLY): 22 min   Charges:   PT Evaluation $PT Eval Low Complexity: 1 Procedure     PT G Codes:   PT G-Codes **NOT FOR INPATIENT CLASS** Functional Assessment Tool Used: AM-PAC 6 Clicks Basic Mobility Functional Limitation: Mobility: Walking and moving around Mobility: Walking and Moving Around Current Status [id number]): At least 40 percent but less than 60 percent impaired, limited or restricted Mobility: Walking and Moving Around Goal Status [phone]): At least 20 percent but less than 40 percent  impaired, limited or restricted    Phillips Grout PT, DPT   Huprich,Jason 06/04/2016, 10:04 AM

## 2016-06-04 NOTE — Care Management Obs Status (Signed)
[facility] NOTIFICATION   Patient Details  Name: Miguel [name] MRN: [id number] Date of Birth: 1930/04/06   Medicare Observation Status Notification Given:  Yes    Beverly Sessions, RN 06/04/2016, 3:58 PM

## 2016-06-04 NOTE — Progress Notes (Signed)
[facility] [place] NAME: Miguel [name]    MR#:  [id number]  DATE OF BIRTH:  18-Jun-1929  SUBJECTIVE:  CHIEF COMPLAINT:   Chief Complaint  Patient presents with  . Abdominal Pain   Patient is 83 year old Caucasian male with past medical history significant for history of esophageal cancer, anxiety, COPD, dementia, diabetes, gastroesophageal reflux disease, who presented to the hospital with complaints of upper abdominal pain, which was constant in the epigastric area and right upper quadrant. On arrival to the hospital. He had low-grade fever. In emergency room, patient had one way abdominal x-ray revealing adynamic ileus, CT of the chest, revealing left pleural effusion, which was tapped. Patient was given MiraLAX after which he had bowel movement and his abdominal pain resolved. He feels better now, complains of some cough, chronic, no phlegm production. Denies any dysphagia symptoms The patient was noted to be in A. fib, RVR with heart rate around 100. In emergency room, improved with thoracentesis. The patient is not on anticoagulation for atrial fibrillation. He was seen by cardiologist, diuresis was recommended with Lasix  Review of Systems  Constitutional: Negative for chills, fever and weight loss.  HENT: Negative for congestion.   Eyes: Negative for blurred vision and double vision.  Respiratory: Positive for cough and shortness of breath. Negative for sputum production and wheezing.   Cardiovascular: Negative for chest pain, palpitations, orthopnea, leg swelling and PND.  Gastrointestinal: Negative for abdominal pain, blood in stool, constipation, diarrhea, nausea and vomiting.  Genitourinary: Negative for dysuria, frequency, hematuria and urgency.  Musculoskeletal: Negative for falls.  Neurological: Negative for dizziness, tremors, focal weakness and headaches.  Endo/Heme/Allergies: Does not bruise/bleed easily.   Psychiatric/Behavioral: Negative for depression. The patient does not have insomnia.     VITAL SIGNS: Blood pressure 117/60, pulse 81, temperature 98 F (36.7 C), temperature source Oral, resp. rate 16, height 5\' 11"  (1.803 m), weight 108.9 kg (240 lb), SpO2 91 %.  PHYSICAL EXAMINATION:   GENERAL:  83 y.o.-year-old patient lying in the bed in mild-to-moderate respiratory distress, intermittently tachypneic, talking and moving around. Intermittent throat clearing EYES: Pupils equal, round, reactive to light and accommodation. No scleral icterus. Extraocular muscles intact.  HEENT: Head atraumatic, normocephalic. Oropharynx and nasopharynx clear.  NECK:  Supple, no jugular venous distention. No thyroid enlargement, no tenderness.  LUNGS: Diminished breath sounds on the left, some dullness to percussion, good air entrance on the right, no wheezing, scattered rales,rhonchi and crepitation on the lefts .  Intermittent use of accessory muscles of respiration.  CARDIOVASCULAR: S1, S2 normal. No murmurs, rubs, or gallops.  ABDOMEN: Soft, nontender, nondistended. Bowel sounds present. No organomegaly or mass.  EXTREMITIES:  one plus lower extremity andal edema, , no cyanosis, or clubbing.  NEUROLOGIC: Cranial nerves II through XII are intact. Muscle strength 5/5 in all extremities. Sensation intact. Gait not checked.  PSYCHIATRIC: The patient is alert and oriented x 3.  SKIN: No obvious rash, lesion, or ulcer.   ORDERS/RESULTS REVIEWED:   CBC  Recent Labs Lab 06/03/16 0837  WBC 8.8  HGB 14.0  HCT 40.9  PLT 116*  MCV 95.3  MCH 32.6  MCHC 34.2  RDW 15.7*   ------------------------------------------------------------------------------------------------------------------  Chemistries   Recent Labs Lab 06/03/16 0837  NA 138  K 4.5  CL 102  CO2 30  GLUCOSE 148*  BUN 25*  CREATININE 1.76*  CALCIUM 8.5*  AST 28  ALT 18  ALKPHOS 116  BILITOT 1.0    ------------------------------------------------------------------------------------------------------------------  estimated creatinine clearance is 39.2 mL/min (by C-G formula based on SCr of 1.76 mg/dL (H)). ------------------------------------------------------------------------------------------------------------------  Recent Labs  06/03/16 1547  TSH 1.986    Cardiac Enzymes  Recent Labs Lab 06/03/16 1547 06/03/16 2115 06/04/16 0336  TROPONINI 0.03* 0.03* <0.03   ------------------------------------------------------------------------------------------------------------------ Invalid input(s): POCBNP ---------------------------------------------------------------------------------------------------------------  RADIOLOGY: Dg Abdomen 1 View  Result Date: 06/03/2016 CLINICAL DATA:  Abdominal pain. EXAM: ABDOMEN - 1 VIEW COMPARISON:  CT 05/12/2016. FINDINGS: Soft tissue structures are unremarkable. Distended loops of small bowel noted. Colonic gas pattern is normal. No free air. Degenerative changes thoracolumbar spine. Left base subsegmental atelectasis. IMPRESSION: 1. Distended loops of small bowel noted. Colonic gas pattern normal. Findings suggest mild adynamic ileus. 2. Left base subsegmental atelectasis again noted. Electronically Signed   By: Marcello Moores  Register   On: 06/03/2016 10:18   Ct Angio Chest Pe W And/or Wo Contrast  Result Date: 06/03/2016 CLINICAL DATA:  83 year old diabetic male with esophageal cancer and dementia presenting with abdominal pain for the past 2 days. Right-sided chest pain. Atrial fibrillation. Initial encounter. EXAM: CT ANGIOGRAPHY CHEST WITH CONTRAST TECHNIQUE: Multidetector CT imaging of the chest was performed using the standard protocol during bolus administration of intravenous contrast. Multiplanar CT image reconstructions and MIPs were obtained to evaluate the vascular anatomy. CONTRAST:  60 cc Isovue 370. COMPARISON:  05/12/2016. FINDINGS:  Cardiovascular: No pulmonary embolus or aortic dissection. Right central line tip right atrium. Non-opacified blood adjacent to the central line probably represents mixing of non-opacified blood rather than thrombus. Cardiomegaly. Small pericardial effusion/ pericardial thickening. Coronary artery calcifications. Mediastinum/Nodes: Esophageal wall thickening (most notable superior thoracic esophagus) similar to prior exam. No adenopathy. Lungs/Pleura: Interval development of small to moderate-size left-sided pleural effusion. Adjacent passive atelectasis. Post therapy changes medial aspect right lung. Upper Abdomen: No acute abnormality. Right renal mass noted on prior exam not of imaged on the current exam. Musculoskeletal: Thoracic kyphosis with flowing confluent anterior osteophyte without destructive lesion. Review of the MIP images confirms the above findings. IMPRESSION: No pulmonary embolus or aortic dissection. Interval development of small to moderate-size left-sided pleural effusion. Adjacent passive atelectasis. Post therapy changes medial aspect right lung. Esophageal wall thickening (most notable superior thoracic esophagus) similar to prior exam. Cardiomegaly. Small pericardial effusion/pericardial thickening. Coronary artery calcifications. Electronically Signed   By: Genia Del M.D.   On: 06/03/2016 10:45   US Venous Img Lower Bilateral  Result Date: 06/03/2016 CLINICAL DATA:  Lower extremity pain for about 2 days EXAM: BILATERAL LOWER EXTREMITY VENOUS DOPPLER ULTRASOUND TECHNIQUE: Gray-scale sonography with graded compression, as well as color Doppler and duplex ultrasound were performed to evaluate the lower extremity deep venous systems from the level of the common femoral vein and including the common femoral, femoral, profunda femoral, popliteal and calf veins including the posterior tibial, peroneal and gastrocnemius veins when visible. The superficial great saphenous vein was also  interrogated. Spectral Doppler was utilized to evaluate flow at rest and with distal augmentation maneuvers in the common femoral, femoral and popliteal veins. COMPARISON:  None. FINDINGS: RIGHT LOWER EXTREMITY Common Femoral Vein: No evidence of thrombus. Normal compressibility, respiratory phasicity and response to augmentation. Saphenofemoral Junction: No evidence of thrombus. Normal compressibility and flow on color Doppler imaging. Profunda Femoral Vein: No evidence of thrombus. Normal compressibility and flow on color Doppler imaging. Femoral Vein: No evidence of thrombus. Normal compressibility, respiratory phasicity and response to augmentation. Popliteal Vein: No evidence of thrombus. Normal compressibility, respiratory phasicity and response to augmentation. Calf Veins: No evidence  of thrombus. Normal compressibility and flow on color Doppler imaging. Superficial Great Saphenous Vein: No evidence of thrombus. Normal compressibility and flow on color Doppler imaging. Venous Reflux:  None. Other Findings:  None. LEFT LOWER EXTREMITY Common Femoral Vein: No evidence of thrombus. Normal compressibility, respiratory phasicity and response to augmentation. Saphenofemoral Junction: No evidence of thrombus. Normal compressibility and flow on color Doppler imaging. Profunda Femoral Vein: No evidence of thrombus. Normal compressibility and flow on color Doppler imaging. Femoral Vein: No evidence of thrombus. Normal compressibility, respiratory phasicity and response to augmentation. Popliteal Vein: No evidence of thrombus. Normal compressibility, respiratory phasicity and response to augmentation. Calf Veins: No evidence of thrombus. Normal compressibility and flow on color Doppler imaging. Superficial Great Saphenous Vein: No evidence of thrombus. Normal compressibility and flow on color Doppler imaging. Venous Reflux:  None. Other Findings:  None. IMPRESSION: No evidence of deep venous thrombosis bilateral lower  extremity. Electronically Signed   By: Lahoma Crocker M.D.   On: 06/03/2016 10:04   Dg Chest Port 1 View  Result Date: 06/03/2016 CLINICAL DATA:  S/p left thoracentesis EXAM: PORTABLE CHEST - 1 VIEW COMPARISON:  CT from earlier the same day FINDINGS: No pneumothorax. Persisting consolidation/atelectasis laterally at the left lung base. Right lung clear. Heart size upper limits normal for technique.  Atheromatous aorta. Can not exclude small residual left pleural effusion. Stable right IJ port catheter to the cavoatrial junction. Visualized bones unremarkable. IMPRESSION: 1. No pneumothorax post left thoracentesis. Electronically Signed   By: Lucrezia Europe M.D.   On: 06/03/2016 13:53   US Thoracentesis Asp Pleural Space W/img Guide  Result Date: 06/03/2016 CLINICAL DATA:  Shortness of Breath.  Left pleural effusion on CT. EXAM: EXAM THORACENTESIS WITH ULTRASOUND TECHNIQUE: The procedure, risks (including but not limited to bleeding, infection, organ damage ), benefits, and alternatives were explained to the family and patient. Questions regarding the procedure were encouraged and answered. The spouse understands and consents to the procedure. Survey ultrasound of the left hemithorax was performed and an appropriate skin entry site was localized. Site was marked, prepped with Betadine, draped in usual sterile fashion, infiltrated locally with 1% lidocaine. The Saf-T-Centesis needle was advanced into the pleural space. Clear yellow fluid returned. 325 mL was removed. Post procedure imaging shows no residual fluid. Sample sent for the requested laboratory studies. The patient tolerated procedure well. COMPLICATIONS: COMPLICATIONS None immediate IMPRESSION: Technically successful ultrasound-guided left thoracentesis. Follow-up chest radiograph shows no pneumothorax. Electronically Signed   By: Lucrezia Europe M.D.   On: 06/03/2016 13:55    EKG:  Orders placed or performed during the hospital encounter of 06/03/16  . ED  EKG  . ED EKG  . EKG 12-Lead  . EKG 12-Lead  . ED EKG  . ED EKG  . ED EKG  . ED EKG    ASSESSMENT AND PLAN:  Active Problems:   Abdominal pain  #1. Left pleural effusion, concerning for malignant, status post paracentesis 06/03/2016, continue Lasix, waiting for labs, get oncologist to see patient in consultation   #2 A. fib with mild congestive heart failure, continue Lasix, echocardiogram is pending, appreciate cardiology's input, heart rate is well controlled after thoracentesis #3. Adynamic ileus, etiology is unclear, suspected constipation related, resolved with MiraLAX, follow clinically #4. Elevated troponin, likely demand ischemia, no further interventions by cardiologist #5. Acute versus chronic renal failure, follow-up with therapy, repeat creatinine in the morning, get urinalysis, postvoid bladder scan  Management plans discussed with the patient, family and they  are in agreement.   DRUG ALLERGIES: No Known Allergies  CODE STATUS:     Code Status Orders        Start     Ordered   06/03/16 1533  Full code  Continuous     06/03/16 1533    Code Status History    Date Active Date Inactive Code Status Order ID Comments User Context   This patient has a current code status but no historical code status.      TOTAL TIME TAKING CARE OF THIS PATIENT: 40 minutes.    Theodoro Grist M.D on 06/04/2016 at 2:33 PM  Between 7am to 6pm - Pager - [phone]  After 6pm go to [email or web address] - password [email or web address]  Office  [phone]  CC: Primary care physician; Coral Spikes, DO

## 2016-06-05 ENCOUNTER — Observation Stay: Payer: Medicare Other

## 2016-06-05 DIAGNOSIS — Z87891 Personal history of nicotine dependence: Secondary | ICD-10-CM

## 2016-06-05 DIAGNOSIS — F0390 Unspecified dementia without behavioral disturbance: Secondary | ICD-10-CM | POA: Diagnosis not present

## 2016-06-05 DIAGNOSIS — Z8042 Family history of malignant neoplasm of prostate: Secondary | ICD-10-CM

## 2016-06-05 DIAGNOSIS — J90 Pleural effusion, not elsewhere classified: Secondary | ICD-10-CM

## 2016-06-05 DIAGNOSIS — Z7982 Long term (current) use of aspirin: Secondary | ICD-10-CM

## 2016-06-05 DIAGNOSIS — Z8501 Personal history of malignant neoplasm of esophagus: Secondary | ICD-10-CM | POA: Diagnosis not present

## 2016-06-05 DIAGNOSIS — F1020 Alcohol dependence, uncomplicated: Secondary | ICD-10-CM

## 2016-06-05 DIAGNOSIS — F419 Anxiety disorder, unspecified: Secondary | ICD-10-CM

## 2016-06-05 DIAGNOSIS — E119 Type 2 diabetes mellitus without complications: Secondary | ICD-10-CM

## 2016-06-05 DIAGNOSIS — H3530 Unspecified macular degeneration: Secondary | ICD-10-CM

## 2016-06-05 DIAGNOSIS — R5381 Other malaise: Secondary | ICD-10-CM

## 2016-06-05 DIAGNOSIS — Z8041 Family history of malignant neoplasm of ovary: Secondary | ICD-10-CM

## 2016-06-05 DIAGNOSIS — Z8051 Family history of malignant neoplasm of kidney: Secondary | ICD-10-CM

## 2016-06-05 DIAGNOSIS — R531 Weakness: Secondary | ICD-10-CM | POA: Diagnosis not present

## 2016-06-05 DIAGNOSIS — N2889 Other specified disorders of kidney and ureter: Secondary | ICD-10-CM | POA: Diagnosis not present

## 2016-06-05 DIAGNOSIS — Z7901 Long term (current) use of anticoagulants: Secondary | ICD-10-CM

## 2016-06-05 DIAGNOSIS — Z9221 Personal history of antineoplastic chemotherapy: Secondary | ICD-10-CM

## 2016-06-05 DIAGNOSIS — Z79899 Other long term (current) drug therapy: Secondary | ICD-10-CM

## 2016-06-05 DIAGNOSIS — E785 Hyperlipidemia, unspecified: Secondary | ICD-10-CM | POA: Diagnosis not present

## 2016-06-05 DIAGNOSIS — R5383 Other fatigue: Secondary | ICD-10-CM

## 2016-06-05 DIAGNOSIS — Z923 Personal history of irradiation: Secondary | ICD-10-CM

## 2016-06-05 DIAGNOSIS — J449 Chronic obstructive pulmonary disease, unspecified: Secondary | ICD-10-CM

## 2016-06-05 LAB — CBC
HCT: 37.2 % — ABNORMAL LOW (ref 40.0–52.0)
Hemoglobin: 12.7 g/dL — ABNORMAL LOW (ref 13.0–18.0)
MCH: 32.4 pg (ref 26.0–34.0)
MCHC: 34.1 g/dL (ref 32.0–36.0)
MCV: 95 fL (ref 80.0–100.0)
PLATELETS: 126 10*3/uL — AB (ref 150–440)
RBC: 3.92 MIL/uL — AB (ref 4.40–5.90)
RDW: 15.9 % — ABNORMAL HIGH (ref 11.5–14.5)
WBC: 5.6 10*3/uL (ref 3.8–10.6)

## 2016-06-05 LAB — BASIC METABOLIC PANEL
Anion gap: 5 (ref 5–15)
BUN: 28 mg/dL — AB (ref 6–20)
CALCIUM: 8.5 mg/dL — AB (ref 8.9–10.3)
CO2: 30 mmol/L (ref 22–32)
CREATININE: 1.58 mg/dL — AB (ref 0.61–1.24)
Chloride: 105 mmol/L (ref 101–111)
GFR calc non Af Amer: 38 mL/min — ABNORMAL LOW (ref >60)
GFR, EST AFRICAN AMERICAN: 45 mL/min — AB (ref >60)
GLUCOSE: 107 mg/dL — AB (ref 65–99)
Potassium: 4 mmol/L (ref 3.5–5.1)
Sodium: 140 mmol/L (ref 135–145)

## 2016-06-05 LAB — URINALYSIS, COMPLETE (UACMP) WITH MICROSCOPIC
BILIRUBIN URINE: NEGATIVE
Bacteria, UA: NONE SEEN
Glucose, UA: NEGATIVE mg/dL
HGB URINE DIPSTICK: NEGATIVE
KETONES UR: NEGATIVE mg/dL
Nitrite: NEGATIVE
PH: 5 (ref 5.0–8.0)
Protein, ur: NEGATIVE mg/dL
Specific Gravity, Urine: 1.005 (ref 1.005–1.030)

## 2016-06-05 LAB — GLUCOSE, CAPILLARY
GLUCOSE-CAPILLARY: 108 mg/dL — AB (ref 65–99)
GLUCOSE-CAPILLARY: 135 mg/dL — AB (ref 65–99)
Glucose-Capillary: 116 mg/dL — ABNORMAL HIGH (ref 65–99)
Glucose-Capillary: 125 mg/dL — ABNORMAL HIGH (ref 65–99)

## 2016-06-05 NOTE — Progress Notes (Signed)
Dr. Mike Gip was called, (on call MD for oncology). RN was notified to call Dr. Burlene Arnt. Dr. Burlene Arnt stated he would get in touch with Dr. Grayland Ormond who should be the MD to complete the consult for this patient.

## 2016-06-05 NOTE — Consult Note (Signed)
[place]  [phone] [phone]) [phone]  ID: Miguel [name] OB: 04/19/1929  MR#: [id number]  [id number]  Patient Care Team: Coral Spikes, DO as PCP - General (Family Medicine)  CHIEF COMPLAINT: History of esophageal cancer, renal mass, new left pleural effusion.  INTERVAL HISTORY: Patient is a 83 year old male with history of esophageal cancer treated with chemotherapy and XRT at an outside facility. He was found to have an enlarging renal mass and is currently being evaluated by urology. Over the past 2-3 days patient was becoming or weak and fatigued. His wife reports some confusion with low-grade fevers. He also had increasing shortness of breath. Patient was found to be in atrial fibrillation with RVR as well as a pleural effusion. After thoracentesis, he felt symptomatically improved. Patient currently feels well and nearly back to his baseline. He has no neurologic complaints. He denies any chest pain, cough, or hemoptysis. He previously have abdominal pain, but this resolved with bowel movement. He denies any nausea, vomiting, constipation, or diarrhea. He has no urinary complaints. Patient offers no further specific complaints.  REVIEW OF SYSTEMS:   Review of Systems  Constitutional: Positive for fever and malaise/fatigue. Negative for weight loss.  Respiratory: Positive for shortness of breath. Negative for cough and hemoptysis.   Cardiovascular: Negative.  Negative for chest pain and leg swelling.  Gastrointestinal: Negative.  Negative for abdominal pain.  Genitourinary: Negative.   Musculoskeletal: Negative.   Neurological: Positive for weakness.  Psychiatric/Behavioral: Negative.  The patient is not nervous/anxious.     As per HPI. Otherwise, a complete review of systems is negative.  PAST MEDICAL HISTORY: Past Medical History:  Diagnosis Date  . Alcoholism ([place])   . Anxiety   . COPD (chronic obstructive pulmonary disease) ([place])     . Dementia 03/18/2016  . DM type 2 with diabetic peripheral neuropathy ([place]) 03/18/2016  . Esophageal cancer ([place])   . GERD (gastroesophageal reflux disease) 03/18/2016  . Hyperlipidemia   . Macular degeneration     PAST SURGICAL HISTORY: Past Surgical History:  Procedure Laterality Date  . BACK SURGERY    . CATARACT EXTRACTION    . KNEE SURGERY      FAMILY HISTORY: Family History  Problem Relation Age of Onset  . Ovarian cancer Sister   . Throat cancer Brother   . Diabetes Mother   . Kidney disease Father   . Prostate cancer Neg Hx   . Kidney cancer Neg Hx     ADVANCED DIRECTIVES (Y/N):  @ADVDIR @  HEALTH MAINTENANCE: Social History  Substance Use Topics  . Smoking status: Former Research scientist (life sciences)  . Smokeless tobacco: Never Used  . Alcohol use No     Colonoscopy:  PAP:  Bone density:  Lipid panel:  No Known Allergies  Current Facility-Administered Medications  Medication Dose Route Frequency Provider Last Rate Last Dose  . 0.9 %  sodium chloride infusion  250 mL Intravenous PRN Dustin Flock, MD      . ALPRAZolam Duanne Moron) tablet 0.5 mg  0.5 mg Oral TID Dustin Flock, MD   0.5 mg at 06/05/16 2127  . aspirin chewable tablet 81 mg  81 mg Oral Daily Dustin Flock, MD   81 mg at 06/05/16 1129  . atorvastatin (LIPITOR) tablet 40 mg  40 mg Oral Daily Dustin Flock, MD   40 mg at 06/05/16 1125  . clotrimazole (LOTRIMIN) 1 % cream 1 application  1 application Topical BID PRN Dustin Flock, MD      .  enoxaparin (LOVENOX) injection 40 mg  40 mg Subcutaneous Q24H Dustin Flock, MD   40 mg at 06/05/16 2127  . furosemide (LASIX) tablet 20 mg  20 mg Oral Daily Dustin Flock, MD   20 mg at 06/05/16 1128  . gabapentin (NEURONTIN) capsule 300 mg  300 mg Oral BID Dustin Flock, MD   300 mg at 06/05/16 2127  . guaiFENesin-dextromethorphan (ROBITUSSIN DM) 100-10 MG/5ML syrup 5 mL  5 mL Oral Q4H PRN Dustin Flock, MD   5 mL at 06/05/16 [id number]  . insulin aspart (novoLOG) injection 0-9  Units  0-9 Units Subcutaneous TID WC Dustin Flock, MD   1 Units at 06/05/16 1151  . ketoconazole (NIZORAL) 2 % cream 1 application  1 application Topical Daily PRN Dustin Flock, MD      . loratadine (CLARITIN) tablet 10 mg  10 mg Oral Daily PRN Dustin Flock, MD      . metoprolol tartrate (LOPRESSOR) tablet 12.5 mg  12.5 mg Oral BID Dustin Flock, MD   12.5 mg at 06/05/16 2123  . mometasone-formoterol (DULERA) 200-5 MCG/ACT inhaler 2 puff  2 puff Inhalation BID Dustin Flock, MD   2 puff at 06/05/16 2035  . pantoprazole (PROTONIX) EC tablet 40 mg  40 mg Oral Daily Dustin Flock, MD   40 mg at 06/05/16 1129  . polyethylene glycol (MIRALAX / GLYCOLAX) packet 17 g  17 g Oral Daily Dustin Flock, MD   17 g at 06/05/16 1130  . sodium chloride flush (NS) 0.9 % injection 3 mL  3 mL Intravenous Q12H Dustin Flock, MD   3 mL at 06/05/16 2133  . sodium chloride flush (NS) 0.9 % injection 3 mL  3 mL Intravenous PRN Dustin Flock, MD        OBJECTIVE: Vitals:   06/05/16 2036 06/05/16 2123  BP:  (!) 116/58  Pulse:  92  Resp: (!) 24   Temp:       Body mass index is 33.47 kg/m.    ECOG FS:1 - Symptomatic but completely ambulatory  General: Well-developed, well-nourished, no acute distress. Eyes: Pink conjunctiva, anicteric sclera. HEENT: Normocephalic, moist mucous membranes, clear oropharnyx. Lungs: Clear to auscultation bilaterally. Heart: Regular rate and rhythm. No rubs, murmurs, or gallops. Abdomen: Soft, nontender, nondistended. No organomegaly noted, normoactive bowel sounds. Musculoskeletal: No edema, cyanosis, or clubbing. Neuro: Alert, answering all questions appropriately. Cranial nerves grossly intact. Skin: No rashes or petechiae noted. Psych: Normal affect. Lymphatics: No cervical, calvicular, axillary or inguinal LAD.   LAB RESULTS:  Lab Results  Component Value Date   NA 140 06/05/2016   K 4.0 06/05/2016   CL 105 06/05/2016   CO2 30 06/05/2016   GLUCOSE 107 (H)  06/05/2016   BUN 28 (H) 06/05/2016   CREATININE 1.58 (H) 06/05/2016   CALCIUM 8.5 (L) 06/05/2016   PROT 6.8 06/03/2016   ALBUMIN 3.7 06/03/2016   AST 28 06/03/2016   ALT 18 06/03/2016   ALKPHOS 116 06/03/2016   BILITOT 1.0 06/03/2016   GFRNONAA 38 (L) 06/05/2016   GFRAA 45 (L) 06/05/2016    Lab Results  Component Value Date   WBC 5.6 06/05/2016   NEUTROABS 3.6 05/12/2016   HGB 12.7 (L) 06/05/2016   HCT 37.2 (L) 06/05/2016   MCV 95.0 06/05/2016   PLT 126 (L) 06/05/2016     STUDIES: Dg Abdomen 1 View  Result Date: 06/03/2016 CLINICAL DATA:  Abdominal pain. EXAM: ABDOMEN - 1 VIEW COMPARISON:  CT 05/12/2016. FINDINGS: Soft tissue structures are unremarkable.  Distended loops of small bowel noted. Colonic gas pattern is normal. No free air. Degenerative changes thoracolumbar spine. Left base subsegmental atelectasis. IMPRESSION: 1. Distended loops of small bowel noted. Colonic gas pattern normal. Findings suggest mild adynamic ileus. 2. Left base subsegmental atelectasis again noted. Electronically Signed   By: Marcello Moores  Register   On: 06/03/2016 10:18   Ct Chest W Contrast  Result Date: 05/12/2016 CLINICAL DATA:  Esophageal cancer. EXAM: CT CHEST, ABDOMEN, AND PELVIS WITH CONTRAST TECHNIQUE: Multidetector CT imaging of the chest, abdomen and pelvis was performed following the standard protocol during bolus administration of intravenous contrast. CONTRAST:  3mL ISOVUE-300 IOPAMIDOL (ISOVUE-300) INJECTION 61% COMPARISON:  02/11/2016. FINDINGS: CT CHEST FINDINGS Cardiovascular: Right IJ Port-A-Cath terminates in the right atrium. Atherosclerotic calcification of the arterial vasculature, including coronary arteries. Heart is at the upper limits of normal in size to mildly enlarged. No pericardial effusion. Mediastinum/Nodes: No pathologically enlarged mediastinal, hilar or axillary lymph nodes. Esophageal wall may be slightly thickened. The esophagus is dilated and air-filled proximally.  Lungs/Pleura: Mild centrilobular emphysema. Small area of subpleural airspace consolidation in the medial right upper lobe is grossly stable. Mild dependent atelectasis in the left lower lobe. Lungs are otherwise clear. Airway is unremarkable. Musculoskeletal: Flowing anterior osteophytosis in the thoracic spine. No worrisome lytic or sclerotic lesions. Old right rib fractures. CT ABDOMEN PELVIS FINDINGS Hepatobiliary: Liver is unremarkable. Numerous stones are seen in the gallbladder. No biliary ductal dilatation. Pancreas: Negative. Spleen: Negative. Adrenals/Urinary Tract: Adrenal glands are unremarkable. A heterogeneous 3.2 cm mass is seen in the lower pole right kidney. Additional low-attenuation lesions in the kidneys measure up to 3.8 cm and 23 Hounsfield units on the left, difficult to characterize as simple cysts. Ureters are decompressed. Presumed TURP defect. Bladder is otherwise grossly unremarkable. Stomach/Bowel: Stomach, small bowel and colon are unremarkable. Appendix is not readily visualized. Vascular/Lymphatic: Atherosclerotic calcification of the arterial vasculature without abdominal aortic aneurysm. No pathologically enlarged lymph nodes. Reproductive:  There appears to be a TURP defect in the bladder. Other: Small bilateral inguinal hernias contain fat. No free fluid. Mesenteries and peritoneum are unremarkable. Musculoskeletal: No worrisome lytic or sclerotic lesions. Degenerative changes are seen in the spine. Postoperative or posttraumatic changes in the right iliac wing. IMPRESSION: 1. Mild esophageal wall thickening, as before. No evidence of metastatic disease. 2. Small area of subpleural consolidation in the medial right upper lobe may be treatment related. 3. Heterogeneous right renal mass, highly worrisome for renal cell carcinoma. These results will be called to the ordering clinician or representative by the Radiologist Assistant, and communication documented in the PACS or zVision  Dashboard. 4. Aortic atherosclerosis (ICD10-170.0). Coronary artery calcification. 5. Cholelithiasis. Electronically Signed   By: Lorin Picket M.D.   On: 05/12/2016 14:00   Ct Angio Chest Pe W And/or Wo Contrast  Result Date: 06/03/2016 CLINICAL DATA:  83 year old diabetic male with esophageal cancer and dementia presenting with abdominal pain for the past 2 days. Right-sided chest pain. Atrial fibrillation. Initial encounter. EXAM: CT ANGIOGRAPHY CHEST WITH CONTRAST TECHNIQUE: Multidetector CT imaging of the chest was performed using the standard protocol during bolus administration of intravenous contrast. Multiplanar CT image reconstructions and MIPs were obtained to evaluate the vascular anatomy. CONTRAST:  60 cc Isovue 370. COMPARISON:  05/12/2016. FINDINGS: Cardiovascular: No pulmonary embolus or aortic dissection. Right central line tip right atrium. Non-opacified blood adjacent to the central line probably represents mixing of non-opacified blood rather than thrombus. Cardiomegaly. Small pericardial effusion/ pericardial thickening. Coronary artery calcifications.  Mediastinum/Nodes: Esophageal wall thickening (most notable superior thoracic esophagus) similar to prior exam. No adenopathy. Lungs/Pleura: Interval development of small to moderate-size left-sided pleural effusion. Adjacent passive atelectasis. Post therapy changes medial aspect right lung. Upper Abdomen: No acute abnormality. Right renal mass noted on prior exam not of imaged on the current exam. Musculoskeletal: Thoracic kyphosis with flowing confluent anterior osteophyte without destructive lesion. Review of the MIP images confirms the above findings. IMPRESSION: No pulmonary embolus or aortic dissection. Interval development of small to moderate-size left-sided pleural effusion. Adjacent passive atelectasis. Post therapy changes medial aspect right lung. Esophageal wall thickening (most notable superior thoracic esophagus) similar to  prior exam. Cardiomegaly. Small pericardial effusion/pericardial thickening. Coronary artery calcifications. Electronically Signed   By: Genia Del M.D.   On: 06/03/2016 10:45   Ct Abdomen Pelvis W Contrast  Result Date: 05/12/2016 CLINICAL DATA:  Esophageal cancer. EXAM: CT CHEST, ABDOMEN, AND PELVIS WITH CONTRAST TECHNIQUE: Multidetector CT imaging of the chest, abdomen and pelvis was performed following the standard protocol during bolus administration of intravenous contrast. CONTRAST:  76mL ISOVUE-300 IOPAMIDOL (ISOVUE-300) INJECTION 61% COMPARISON:  02/11/2016. FINDINGS: CT CHEST FINDINGS Cardiovascular: Right IJ Port-A-Cath terminates in the right atrium. Atherosclerotic calcification of the arterial vasculature, including coronary arteries. Heart is at the upper limits of normal in size to mildly enlarged. No pericardial effusion. Mediastinum/Nodes: No pathologically enlarged mediastinal, hilar or axillary lymph nodes. Esophageal wall may be slightly thickened. The esophagus is dilated and air-filled proximally. Lungs/Pleura: Mild centrilobular emphysema. Small area of subpleural airspace consolidation in the medial right upper lobe is grossly stable. Mild dependent atelectasis in the left lower lobe. Lungs are otherwise clear. Airway is unremarkable. Musculoskeletal: Flowing anterior osteophytosis in the thoracic spine. No worrisome lytic or sclerotic lesions. Old right rib fractures. CT ABDOMEN PELVIS FINDINGS Hepatobiliary: Liver is unremarkable. Numerous stones are seen in the gallbladder. No biliary ductal dilatation. Pancreas: Negative. Spleen: Negative. Adrenals/Urinary Tract: Adrenal glands are unremarkable. A heterogeneous 3.2 cm mass is seen in the lower pole right kidney. Additional low-attenuation lesions in the kidneys measure up to 3.8 cm and 23 Hounsfield units on the left, difficult to characterize as simple cysts. Ureters are decompressed. Presumed TURP defect. Bladder is otherwise  grossly unremarkable. Stomach/Bowel: Stomach, small bowel and colon are unremarkable. Appendix is not readily visualized. Vascular/Lymphatic: Atherosclerotic calcification of the arterial vasculature without abdominal aortic aneurysm. No pathologically enlarged lymph nodes. Reproductive:  There appears to be a TURP defect in the bladder. Other: Small bilateral inguinal hernias contain fat. No free fluid. Mesenteries and peritoneum are unremarkable. Musculoskeletal: No worrisome lytic or sclerotic lesions. Degenerative changes are seen in the spine. Postoperative or posttraumatic changes in the right iliac wing. IMPRESSION: 1. Mild esophageal wall thickening, as before. No evidence of metastatic disease. 2. Small area of subpleural consolidation in the medial right upper lobe may be treatment related. 3. Heterogeneous right renal mass, highly worrisome for renal cell carcinoma. These results will be called to the ordering clinician or representative by the Radiologist Assistant, and communication documented in the PACS or zVision Dashboard. 4. Aortic atherosclerosis (ICD10-170.0). Coronary artery calcification. 5. Cholelithiasis. Electronically Signed   By: Lorin Picket M.D.   On: 05/12/2016 14:00   US Venous Img Lower Bilateral  Result Date: 06/03/2016 CLINICAL DATA:  Lower extremity pain for about 2 days EXAM: BILATERAL LOWER EXTREMITY VENOUS DOPPLER ULTRASOUND TECHNIQUE: Gray-scale sonography with graded compression, as well as color Doppler and duplex ultrasound were performed to evaluate the lower extremity deep venous  systems from the level of the common femoral vein and including the common femoral, femoral, profunda femoral, popliteal and calf veins including the posterior tibial, peroneal and gastrocnemius veins when visible. The superficial great saphenous vein was also interrogated. Spectral Doppler was utilized to evaluate flow at rest and with distal augmentation maneuvers in the common femoral,  femoral and popliteal veins. COMPARISON:  None. FINDINGS: RIGHT LOWER EXTREMITY Common Femoral Vein: No evidence of thrombus. Normal compressibility, respiratory phasicity and response to augmentation. Saphenofemoral Junction: No evidence of thrombus. Normal compressibility and flow on color Doppler imaging. Profunda Femoral Vein: No evidence of thrombus. Normal compressibility and flow on color Doppler imaging. Femoral Vein: No evidence of thrombus. Normal compressibility, respiratory phasicity and response to augmentation. Popliteal Vein: No evidence of thrombus. Normal compressibility, respiratory phasicity and response to augmentation. Calf Veins: No evidence of thrombus. Normal compressibility and flow on color Doppler imaging. Superficial Great Saphenous Vein: No evidence of thrombus. Normal compressibility and flow on color Doppler imaging. Venous Reflux:  None. Other Findings:  None. LEFT LOWER EXTREMITY Common Femoral Vein: No evidence of thrombus. Normal compressibility, respiratory phasicity and response to augmentation. Saphenofemoral Junction: No evidence of thrombus. Normal compressibility and flow on color Doppler imaging. Profunda Femoral Vein: No evidence of thrombus. Normal compressibility and flow on color Doppler imaging. Femoral Vein: No evidence of thrombus. Normal compressibility, respiratory phasicity and response to augmentation. Popliteal Vein: No evidence of thrombus. Normal compressibility, respiratory phasicity and response to augmentation. Calf Veins: No evidence of thrombus. Normal compressibility and flow on color Doppler imaging. Superficial Great Saphenous Vein: No evidence of thrombus. Normal compressibility and flow on color Doppler imaging. Venous Reflux:  None. Other Findings:  None. IMPRESSION: No evidence of deep venous thrombosis bilateral lower extremity. Electronically Signed   By: Lahoma Crocker M.D.   On: 06/03/2016 10:04   Dg Chest Port 1 View  Result Date:  06/05/2016 CLINICAL DATA:  83 year old male with abdominal pain and fever. Status post left thoracentesis on 06/03/2016. Personal history of esophageal cancer. Initial encounter. EXAM: PORTABLE CHEST 1 VIEW COMPARISON:  04/02/2017 and earlier. FINDINGS: Portable AP upright view at 0416 hours. Progressed veiling opacity in the left lung since the post thoracentesis images. At the same time there appears to be some volume loss on the left, with slight leftward shift of the mediastinum. The right lung remains clear allowing for portable technique. Stable visible mediastinal contours. Calcified aortic atherosclerosis. Stable right chest porta cath. Chronic right lateral rib fractures. IMPRESSION: 1. Increasing veiling opacity and probably also volume loss in the left lung since the thoracentesis on 06/03/2016. Suspect some re-accumulation of left pleural effusion. 2. The right lung remains clear. Electronically Signed   By: Genevie Ann M.D.   On: 06/05/2016 08:27   Dg Chest Port 1 View  Result Date: 06/03/2016 CLINICAL DATA:  S/p left thoracentesis EXAM: PORTABLE CHEST - 1 VIEW COMPARISON:  CT from earlier the same day FINDINGS: No pneumothorax. Persisting consolidation/atelectasis laterally at the left lung base. Right lung clear. Heart size upper limits normal for technique.  Atheromatous aorta. Can not exclude small residual left pleural effusion. Stable right IJ port catheter to the cavoatrial junction. Visualized bones unremarkable. IMPRESSION: 1. No pneumothorax post left thoracentesis. Electronically Signed   By: Lucrezia Europe M.D.   On: 06/03/2016 13:53   US Thoracentesis Asp Pleural Space W/img Guide  Result Date: 06/03/2016 CLINICAL DATA:  Shortness of Breath.  Left pleural effusion on CT. EXAM: EXAM THORACENTESIS WITH ULTRASOUND  TECHNIQUE: The procedure, risks (including but not limited to bleeding, infection, organ damage ), benefits, and alternatives were explained to the family and patient. Questions  regarding the procedure were encouraged and answered. The spouse understands and consents to the procedure. Survey ultrasound of the left hemithorax was performed and an appropriate skin entry site was localized. Site was marked, prepped with Betadine, draped in usual sterile fashion, infiltrated locally with 1% lidocaine. The Saf-T-Centesis needle was advanced into the pleural space. Clear yellow fluid returned. 325 mL was removed. Post procedure imaging shows no residual fluid. Sample sent for the requested laboratory studies. The patient tolerated procedure well. COMPLICATIONS: COMPLICATIONS None immediate IMPRESSION: Technically successful ultrasound-guided left thoracentesis. Follow-up chest radiograph shows no pneumothorax. Electronically Signed   By: Lucrezia Europe M.D.   On: 06/03/2016 13:55    ASSESSMENT: History of esophageal cancer, renal mass, new left pleural effusion.  PLAN:    1. Stage IIIb esophageal cancer: CT scan results from May 12, 2016 reviewed independently with no obvious evidence of recurrent or metastatic disease. Essentially unchanged from scans at outside facility dating back to November 19, 2015. CT scan on June 03, 2016 also not convincing of recurrence. Patient's pleural effusion was tapped only removing 325 mL of fluid and cytology was negative. Despite this, suspicion is moderate for recurrent disease therefore will get a PET scan to further evaluate. Appreciate thoracic surgery input. 2. Right renal mass: Continue evaluation and diagnostic planning per urology. 3. Pleural effusion: Cytology negative for malignancy. Although no etiology has been determined, would be unusual for metastatic disease. Appreciate thoracic surgery input. Proceed with PET scan tomorrow as above. If positive, will consider biopsy. If PET scan is negative, patient has been instructed to keep his previously scheduled follow-up in the [place] in approximately 2 months.  Appreciate consult. I  will be out of town until Tuesday, June 10, 2016. Please call the on-call physician if there are any questions or concerns.   Lloyd Huger, MD   06/05/2016 10:47 PM

## 2016-06-05 NOTE — Progress Notes (Signed)
Post void residual was 0.  

## 2016-06-05 NOTE — Progress Notes (Signed)
Patient ID: Miguel [name], male   DOB: [birth year], 83 y.o.   MRN: [id number]  Chief Complaint  Patient presents with  . Abdominal Pain    Referred By Dr. Ether Griffins Reason for Referral left pleural effusion  HPI Location, Quality, Duration, Severity, Timing, Context, Modifying Factors, Associated Signs and Symptoms.  Miguel [name] is a 83 y.o. male.  He has a prior history of esophageal carcinoma which was treated with radiation therapy and chemotherapy which completed back in the fall of 2017. He also has a history of dementia alcohol and tobacco use. He was in his usual state of health until several days ago he presented to the hospital with increasing shortness of breath fever some confusion and was admitted. He underwent a chest x-ray and chest CT scan which revealed a possible left lower lobe pneumonia with some fluid and he underwent an ultrasound-guided thoracentesis. The ultrasound-guided thoracentesis revealed 300 cc of fluid that was negative for malignancy and did not have the appearance of any purulent material. The patient had a repeat chest x-ray today which shows some opacity at the left lower lobe which may or may not be related to atelectasis or pleural effusion. The patient was recently seen by Dr. Delight Hoh in oncology and he did not require any additional therapy at this point. At the present time he has no known disease having had a good response to his radiation therapy and chemotherapy. The patient also has a history of dementia and some of his history is obtained from the patient's son who is at his bedside.   Past Medical History:  Diagnosis Date  . Alcoholism ([place])   . Anxiety   . COPD (chronic obstructive pulmonary disease) ([place])   . Dementia 03/18/2016  . DM type 2 with diabetic peripheral neuropathy ([place]) 03/18/2016  . Esophageal cancer ([place])   . GERD (gastroesophageal reflux disease) 03/18/2016  . Hyperlipidemia   . Macular degeneration     Past  Surgical History:  Procedure Laterality Date  . BACK SURGERY    . CATARACT EXTRACTION    . KNEE SURGERY      Family History  Problem Relation Age of Onset  . Ovarian cancer Sister   . Throat cancer Brother   . Diabetes Mother   . Kidney disease Father   . Prostate cancer Neg Hx   . Kidney cancer Neg Hx     Social History Social History  Substance Use Topics  . Smoking status: Former Research scientist (life sciences)  . Smokeless tobacco: Never Used  . Alcohol use No    No Known Allergies  Current Facility-Administered Medications  Medication Dose Route Frequency Provider Last Rate Last Dose  . 0.9 %  sodium chloride infusion  250 mL Intravenous PRN Dustin Flock, MD      . ALPRAZolam Duanne Moron) tablet 0.5 mg  0.5 mg Oral TID Dustin Flock, MD   0.5 mg at 06/04/16 2247  . aspirin chewable tablet 81 mg  81 mg Oral Daily Dustin Flock, MD   81 mg at 06/05/16 1129  . atorvastatin (LIPITOR) tablet 40 mg  40 mg Oral Daily Dustin Flock, MD   40 mg at 06/05/16 1125  . clotrimazole (LOTRIMIN) 1 % cream 1 application  1 application Topical BID PRN Dustin Flock, MD      . enoxaparin (LOVENOX) injection 40 mg  40 mg Subcutaneous Q24H Dustin Flock, MD   40 mg at 06/04/16 2247  . furosemide (LASIX) tablet 20 mg  20  mg Oral Daily Dustin Flock, MD   20 mg at 06/05/16 1128  . gabapentin (NEURONTIN) capsule 300 mg  300 mg Oral BID Dustin Flock, MD   300 mg at 06/05/16 1128  . guaiFENesin-dextromethorphan (ROBITUSSIN DM) 100-10 MG/5ML syrup 5 mL  5 mL Oral Q4H PRN Dustin Flock, MD   5 mL at 06/05/16 [id number]  . insulin aspart (novoLOG) injection 0-9 Units  0-9 Units Subcutaneous TID WC Dustin Flock, MD   1 Units at 06/05/16 1151  . ketoconazole (NIZORAL) 2 % cream 1 application  1 application Topical Daily PRN Dustin Flock, MD      . loratadine (CLARITIN) tablet 10 mg  10 mg Oral Daily PRN Dustin Flock, MD      . metoprolol tartrate (LOPRESSOR) tablet 12.5 mg  12.5 mg Oral BID Dustin Flock, MD   12.5 mg  at 06/04/16 1010  . mometasone-formoterol (DULERA) 200-5 MCG/ACT inhaler 2 puff  2 puff Inhalation BID Dustin Flock, MD   2 puff at 06/05/16 0815  . pantoprazole (PROTONIX) EC tablet 40 mg  40 mg Oral Daily Dustin Flock, MD   40 mg at 06/05/16 1129  . polyethylene glycol (MIRALAX / GLYCOLAX) packet 17 g  17 g Oral Daily Dustin Flock, MD   17 g at 06/05/16 1130  . sodium chloride flush (NS) 0.9 % injection 3 mL  3 mL Intravenous Q12H Dustin Flock, MD   3 mL at 06/05/16 1141  . sodium chloride flush (NS) 0.9 % injection 3 mL  3 mL Intravenous PRN Dustin Flock, MD          Review of Systems A complete review of systems was asked and was negative except for the following positive findingsThe patient states that he has been febrile at home. He also complains of some confusion. He states that he had some memory changes recently. However all these symptoms are related to his dementia is difficult to say but the son reports significant problems with short-term memory and therefore the review of systems is suspect for accuracy.  Blood pressure 122/67, pulse 72, temperature 97.8 F (36.6 C), temperature source Oral, resp. rate 18, height 5\' 11"  (1.803 m), weight 240 lb (108.9 kg), SpO2 91 %.  Physical Exam CONSTITUTIONAL:  Pleasant, well-developed, well-nourished, and in no acute distress. EYES: Pupils equal and reactive to light, Sclera non-icteric EARS, NOSE, MOUTH AND THROAT:  The oropharynx was clear.  Dentition is good repair.  Oral mucosa pink and moist. LYMPH NODES:  Lymph nodes in the neck and axillae were normal RESPIRATORY:  Lungs were equal bilaterally but diminished at both bases..  Normal respiratory effort without pathologic use of accessory muscles of respiration CARDIOVASCULAR: Heart was regular without murmurs.  There were no carotid bruits. GI: The abdomen was soft, nontender, and nondistended. There were no palpable masses. There was no hepatosplenomegaly. There were normal  bowel sounds in all quadrants. GU:  Rectal deferred.   MUSCULOSKELETAL:  Normal muscle strength and tone.  No clubbing or cyanosis.   SKIN:  There were no pathologic skin lesions.  There were no nodules on palpation.  He did have a percutaneous gastrostomy tube scar present and also had a Port-A-Cath present in the right infraclavicular area.   Data Reviewed CT scans and chest x-rays.  I have personally reviewed the patient's imaging, laboratory findings and medical records.    Assessment    The chest x-ray from today shows some opacity at the left base. It's not clear if this  is fluid or atelectasis.    Plan    We are still awaiting Dr. Gary Fleet input regarding this case. If the patient requires additional imaging studies for Dr. Grayland Ormond we may be able to tell whether or not there is fluid or atelectasis at the left base. Otherwise I would recommend that we obtain a chest x-ray in the radiology department. I also recommend that the patient be instructed on use of incentive spirometry if possible and that he be allowed to ambulate with physical therapy to try to improve his overall lung function.    At the present time I do not see any obvious indication for drainage, Pleurx catheter or pleurodesis.  Nestor Lewandowsky, MD 06/05/2016, 4:14 PM

## 2016-06-05 NOTE — Plan of Care (Signed)
Problem: Activity: Goal: Risk for activity intolerance will decrease Outcome: Progressing Pt is ambulating to BR.

## 2016-06-05 NOTE — Progress Notes (Signed)
[facility] [place] NAME: Miguel [name]    MR#:  [id number]  DATE OF BIRTH:  Nov 19, 1929  SUBJECTIVE:  CHIEF COMPLAINT:   Chief Complaint  Patient presents with  . Abdominal Pain   Patient is 83 year old Caucasian male with past medical history significant for history of esophageal cancer, anxiety, COPD, dementia, diabetes, gastroesophageal reflux disease, who presented to the hospital with complaints of upper abdominal pain, which was constant in the epigastric area and right upper quadrant. On arrival to the hospital. He had low-grade fever. In emergency room, patient had one way abdominal x-ray revealing adynamic ileus, CT of the chest, revealing left pleural effusion, which was tapped. Patient was given MiraLAX after which he had bowel movement and his abdominal pain resolved. He feels better now, complains of some cough, chronic, no phlegm production. Denies any dysphagia symptoms The patient was noted to be in A. fib, RVR with heart rate around 100. In emergency room, improved with thoracentesis. The patient is not on anticoagulation for atrial fibrillation. He was seen by cardiologist, diuresis was recommended with Lasix. Echocardiogram was normal. The patient was seen by thoracic surgeon, no Pleurx catheter or pleurodesis was recommended. Repeated chest x-ray today revealed reaccumulation of fluid on the left, patient denies any significant shortness of breath, oxygenation remained stable  Review of Systems  Constitutional: Negative for chills, fever and weight loss.  HENT: Negative for congestion.   Eyes: Negative for blurred vision and double vision.  Respiratory: Positive for cough and shortness of breath. Negative for sputum production and wheezing.   Cardiovascular: Negative for chest pain, palpitations, orthopnea, leg swelling and PND.  Gastrointestinal: Negative for abdominal pain, blood in stool, constipation, diarrhea, nausea  and vomiting.  Genitourinary: Negative for dysuria, frequency, hematuria and urgency.  Musculoskeletal: Negative for falls.  Neurological: Negative for dizziness, tremors, focal weakness and headaches.  Endo/Heme/Allergies: Does not bruise/bleed easily.  Psychiatric/Behavioral: Negative for depression. The patient does not have insomnia.     VITAL SIGNS: Blood pressure (!) 109/58, pulse 89, temperature 97 F (36.1 C), temperature source Axillary, resp. rate 16, height 5\' 11"  (1.803 m), weight 108.9 kg (240 lb), SpO2 96 %.  PHYSICAL EXAMINATION:   GENERAL:  83 y.o.-year-old patient lying in the bed in mild-to-moderate respiratory distress, intermittently tachypneic, talking and moving around. Intermittent throat clearing EYES: Pupils equal, round, reactive to light and accommodation. No scleral icterus. Extraocular muscles intact.  HEENT: Head atraumatic, normocephalic. Oropharynx and nasopharynx clear.  NECK:  Supple, no jugular venous distention. No thyroid enlargement, no tenderness.  LUNGS: Diminished breath sounds on the left, some dullness to percussion, good air entrance on the right, no wheezing, scattered rales,rhonchi and crepitation on the lefts .  Intermittent use of accessory muscles of respiration.  CARDIOVASCULAR: S1, S2 normal. No murmurs, rubs, or gallops.  ABDOMEN: Soft, nontender, nondistended. Bowel sounds present. No organomegaly or mass.  EXTREMITIES:  one plus lower extremity andal edema, , no cyanosis, or clubbing.  NEUROLOGIC: Cranial nerves II through XII are intact. Muscle strength 5/5 in all extremities. Sensation intact. Gait not checked.  PSYCHIATRIC: The patient is alert and oriented x 3.  SKIN: No obvious rash, lesion, or ulcer.   ORDERS/RESULTS REVIEWED:   CBC  Recent Labs Lab 06/03/16 0837 06/05/16 0506  WBC 8.8 5.6  HGB 14.0 12.7*  HCT 40.9 37.2*  PLT 116* 126*  MCV 95.3 95.0  MCH 32.6 32.4  MCHC 34.2 34.1  RDW 15.7*  15.9*    ------------------------------------------------------------------------------------------------------------------  Chemistries   Recent Labs Lab 06/03/16 0837 06/05/16 0506  NA 138 140  K 4.5 4.0  CL 102 105  CO2 30 30  GLUCOSE 148* 107*  BUN 25* 28*  CREATININE 1.76* 1.58*  CALCIUM 8.5* 8.5*  AST 28  --   ALT 18  --   ALKPHOS 116  --   BILITOT 1.0  --    ------------------------------------------------------------------------------------------------------------------ estimated creatinine clearance is 43.7 mL/min (by C-G formula based on SCr of 1.58 mg/dL (H)). ------------------------------------------------------------------------------------------------------------------  Recent Labs  06/03/16 1547  TSH 1.986    Cardiac Enzymes  Recent Labs Lab 06/03/16 1547 06/03/16 2115 06/04/16 0336  TROPONINI 0.03* 0.03* <0.03   ------------------------------------------------------------------------------------------------------------------ Invalid input(s): POCBNP ---------------------------------------------------------------------------------------------------------------  RADIOLOGY: Dg Chest Port 1 View  Result Date: 06/05/2016 CLINICAL DATA:  83 year old male with abdominal pain and fever. Status post left thoracentesis on 06/03/2016. Personal history of esophageal cancer. Initial encounter. EXAM: PORTABLE CHEST 1 VIEW COMPARISON:  04/02/2017 and earlier. FINDINGS: Portable AP upright view at 0416 hours. Progressed veiling opacity in the left lung since the post thoracentesis images. At the same time there appears to be some volume loss on the left, with slight leftward shift of the mediastinum. The right lung remains clear allowing for portable technique. Stable visible mediastinal contours. Calcified aortic atherosclerosis. Stable right chest porta cath. Chronic right lateral rib fractures. IMPRESSION: 1. Increasing veiling opacity and probably also volume loss in the  left lung since the thoracentesis on 06/03/2016. Suspect some re-accumulation of left pleural effusion. 2. The right lung remains clear. Electronically Signed   By: Genevie Ann M.D.   On: 06/05/2016 08:27    EKG:  Orders placed or performed during the hospital encounter of 06/03/16  . ED EKG  . ED EKG  . EKG 12-Lead  . EKG 12-Lead  . ED EKG  . ED EKG  . ED EKG  . ED EKG    ASSESSMENT AND PLAN:  Active Problems:   Abdominal pain   Pleural effusion, left  #1. Recurrent Left pleural effusion, concerning for malignant, although cytologies negative for malignancy, status post thoracentesis 06/03/2016, continue Lasix, appreciate thoracic surgeon input, awaiting for oncologist input, getting PET scan, most recent CT of the chest with contrast revealed subpleural thickening in the right middle lobe, likely treatment related, but no masses were noted, discussed with Dr. Grayland Ormond. Repeat chest x-ray in the morning  #2 A. fib with mild congestive heart failure, continue Lasix, echocardiogram is normal with normal ejection fraction, no valvular abnormalities, appreciate cardiology's input, heart rate is well controlled after thoracentesis #3. Adynamic ileus, etiology is unclear, suspected constipation related, resolved with MiraLAX, patient is eating 100% of offered meal, no abdominal pains #4. Elevated  troponin, likely demand ischemia, no further interventions by cardiologist #5. acute on likely chronic refailure, improved with therapy, . Follow creatinine in the morning, urinalysis was unremarkable , postvoid bladder scan is not reported   Management plans discussed with the patient, family and they are in agreement.   DRUG ALLERGIES: No Known Allergies  CODE STATUS:     Code Status Orders        Start     Ordered   06/03/16 1533  Full code  Continuous     06/03/16 1533    Code Status History    Date Active Date Inactive Code Status Order ID Comments User Context   This patient has a  current code status but no historical code status.  TOTAL TIME TAKING CARE OF THIS PATIENT: 40 minutes.  Discussed with Dr. Estelle June M.D on 06/05/2016 at 5:05 PM  Between 7am to 6pm - Pager - [phone]  After 6pm go to [email or web address] - password [email or web address] [facility]  Office  [phone]  CC: Primary care physician; Coral Spikes, DO

## 2016-06-06 DIAGNOSIS — R531 Weakness: Secondary | ICD-10-CM

## 2016-06-06 DIAGNOSIS — I4891 Unspecified atrial fibrillation: Secondary | ICD-10-CM

## 2016-06-06 DIAGNOSIS — N183 Chronic kidney disease, stage 3 unspecified: Secondary | ICD-10-CM

## 2016-06-06 DIAGNOSIS — J90 Pleural effusion, not elsewhere classified: Secondary | ICD-10-CM | POA: Diagnosis not present

## 2016-06-06 LAB — BASIC METABOLIC PANEL
Anion gap: 4 — ABNORMAL LOW (ref 5–15)
BUN: 25 mg/dL — ABNORMAL HIGH (ref 6–20)
CHLORIDE: 103 mmol/L (ref 101–111)
CO2: 34 mmol/L — ABNORMAL HIGH (ref 22–32)
Calcium: 9 mg/dL (ref 8.9–10.3)
Creatinine, Ser: 1.62 mg/dL — ABNORMAL HIGH (ref 0.61–1.24)
GFR calc Af Amer: 43 mL/min — ABNORMAL LOW (ref >60)
GFR calc non Af Amer: 37 mL/min — ABNORMAL LOW (ref >60)
GLUCOSE: 112 mg/dL — AB (ref 65–99)
POTASSIUM: 4.1 mmol/L (ref 3.5–5.1)
Sodium: 141 mmol/L (ref 135–145)

## 2016-06-06 LAB — BODY FLUID CULTURE: Culture: NO GROWTH

## 2016-06-06 LAB — GLUCOSE, CAPILLARY
Glucose-Capillary: 99 mg/dL (ref 65–99)
Glucose-Capillary: 168 mg/dL — ABNORMAL HIGH (ref 65–99)

## 2016-06-06 LAB — CBC
HEMATOCRIT: 38.9 % — AB (ref 40.0–52.0)
HEMOGLOBIN: 12.9 g/dL — AB (ref 13.0–18.0)
MCH: 32.1 pg (ref 26.0–34.0)
MCHC: 33.2 g/dL (ref 32.0–36.0)
MCV: 96.7 fL (ref 80.0–100.0)
Platelets: 139 10*3/uL — ABNORMAL LOW (ref 150–440)
RBC: 4.03 MIL/uL — ABNORMAL LOW (ref 4.40–5.90)
RDW: 15.4 % — AB (ref 11.5–14.5)
WBC: 4.9 10*3/uL (ref 3.8–10.6)

## 2016-06-06 MED ORDER — GUAIFENESIN-DM 100-10 MG/5ML PO SYRP: 5.0000 mL | ORAL_SOLUTION | ORAL | 0 refills | Status: DC | PRN

## 2016-06-06 MED ORDER — METOPROLOL TARTRATE 25 MG PO TABS: 12.5000 mg | ORAL_TABLET | Freq: Two times a day (BID) | ORAL | 6 refills | Status: DC

## 2016-06-06 MED ORDER — POLYETHYLENE GLYCOL 3350 17 G PO PACK: 17.0000 g | PACK | Freq: Every day | ORAL | 0 refills | Status: DC

## 2016-06-06 MED ORDER — FUROSEMIDE 40 MG PO TABS: 40.0000 mg | ORAL_TABLET | Freq: Every day | ORAL | 5 refills | Status: DC

## 2016-06-06 MED ORDER — ASPIRIN 81 MG PO CHEW
81.0000 mg | CHEWABLE_TABLET | Freq: Every day | ORAL | 5 refills | Status: DC
Start: 2016-06-07 — End: 2017-02-08

## 2016-06-06 NOTE — Care Management (Signed)
Patient admitted with left pleural effusion.  Patient lives at [facility].  Lives at home with wife.  Patient has rollator, shower seat, and electric scooter in the home. PT has recommended home health PT. Anticipated patient to discharge with home health PT and RN.  Patient was provided home health agency preference. Patient and family state they do not have a preference.  Heads up referral made to [facility] with [facility].

## 2016-06-06 NOTE — Discharge Summary (Signed)
Miguel [name] at [place] NAME: Miguel [name]    MR#:  [id number]  DATE OF BIRTH:  05-09-1929  DATE OF ADMISSION:  06/03/2016 ADMITTING PHYSICIAN: Dustin Flock, MD  DATE OF DISCHARGE: 06/06/2016  3:41 PM  PRIMARY CARE PHYSICIAN: Coral Spikes, DO     ADMISSION DIAGNOSIS:  Ileus ([place]) [K56.7] Pericardial effusion [I31.3] SOB (shortness of breath) [R06.02] Renal insufficiency [N28.9] New onset atrial fibrillation (HCC) [I48.91] Elevated troponin [R74.8] S/P thoracentesis [Z98.890] Pleural effusion, left [J90]  DISCHARGE DIAGNOSIS:  Active Problems:   Abdominal pain   Ileus (HCC)   Pleural effusion   Atrial fibrillation (HCC)   Acute diastolic CHF (congestive heart failure) (HCC)   Dyspnea   Generalized weakness   Demand ischemia (HCC)   CKD (chronic kidney disease), stage III   Pericardial effusion   SECONDARY DIAGNOSIS:   Past Medical History:  Diagnosis Date  . Alcoholism ([place])   . Anxiety   . COPD (chronic obstructive pulmonary disease) ([place])   . Dementia 03/18/2016  . DM type 2 with diabetic peripheral neuropathy ([place]) 03/18/2016  . Esophageal cancer ([place])   . GERD (gastroesophageal reflux disease) 03/18/2016  . Hyperlipidemia   . Macular degeneration     .pro HOSPITAL COURSE:   Patient is 83 year old Caucasian male with past medical history significant for history of esophageal cancer, anxiety, COPD, dementia, diabetes, gastroesophageal reflux disease, who presented to the hospital with complaints of upper abdominal pain, which was constant in the epigastric area and right upper quadrant. On arrival to the hospital. He had low-grade fever. In emergency room, patient had one way abdominal x-ray revealing adynamic ileus, CT of the chest, revealing left pleural effusion, The patient underwent thoracentesis on 06/03/16. Patient was given MiraLAX after which he had bowel movement and his abdominal pain resolved. The  patient was noted to be in A. Fib with heart rate around 100. He was seen by cardiologist, diuresis was recommended with Lasix. Echocardiogram was normal. The patient was seen by thoracic surgeon, no Pleurx catheter or pleurodesis was recommended, diuretics, to be advanced as needed following patient clinically and weight. Repeated chest x-ray today revealed some reaccumulation of fluid on the left, but patient had been no worsening shortness of breath, oxygenation remained stable. The patient was seen by oncologist, Dr. Grayland Ormond, who recommended PET scan to be performed to rule out left thoracic pathology, however, due to recent thoracentesis PET scan was not performed. He was felt to be stable to be discharged home today. Discussion by problem: #1. Left pleural effusion,  Cytology was negative for malignancy, status post thoracentesis 06/03/2016 was 325 cc of clear yellowish fluid removed, continue Lasix, advanced doses needed, following clinically and the patient's weight, appreciate thoracic surgeon, cardiologist, and oncologist inputs,  most recent CT of the chest with contrast revealed subpleural thickening in the right middle lobe, likely treatment related, but no masses were noted, the patient clinically improved on conservative therapy   #2 A. fib with mild acute diastolic congestive heart failure, continue Lasix, advanced doses needed, echocardiogram was normal with normal ejection fraction, no valvular abnormalities, appreciate cardiology's input, heart rate was stable, no RVR was noted #3. Adynamic ileus, etiology is unclear, suspected constipation related, resolved with MiraLAX, patient is eating 100% of offered meal, no abdominal pains #4. Demand ischemia, no further interventions by cardiologist, echocardiogram was normal #5. Chronic renal failure, likely CK D stage III, stable with therapy, . Follow creatinine as outpatient, urinalysis  was unremarkable , postvoid bladder scan was normal   DISCHARGE CONDITIONS:   Stable  CONSULTS OBTAINED:  Treatment Team:  Corey Skains, MD Lloyd Huger, MD Nestor Lewandowsky, MD  DRUG ALLERGIES:  No Known Allergies  DISCHARGE MEDICATIONS:   Discharge Medication List as of 06/06/2016  2:22 PM    START taking these medications   Details  aspirin 81 MG chewable tablet Chew 1 tablet (81 mg total) by mouth daily., Starting Sat 06/07/2016, Normal    guaiFENesin-dextromethorphan (ROBITUSSIN DM) 100-10 MG/5ML syrup Take 5 mLs by mouth every 4 (four) hours as needed for cough., Starting Fri 06/06/2016, Normal    metoprolol tartrate (LOPRESSOR) 25 MG tablet Take 0.5 tablets (12.5 mg total) by mouth 2 (two) times daily., Starting Fri 06/06/2016, Normal    polyethylene glycol (MIRALAX / GLYCOLAX) packet Take 17 g by mouth daily., Starting Sat 06/07/2016, Normal      CONTINUE these medications which have NOT CHANGED   Details  ADVAIR DISKUS 250-50 MCG/DOSE AEPB Inhale 1 puff into the lungs daily., Starting Tue 01/22/2016, Historical Med    ALPRAZolam (XANAX) 0.5 MG tablet Take 1 tablet by mouth 3 (three) times daily. , Starting Wed 02/06/2016, Historical Med    atorvastatin (LIPITOR) 40 MG tablet Take 1 tablet (40 mg total) by mouth daily., Starting Thu 03/20/2016, Normal    cetirizine (ZYRTEC) 10 MG tablet Take 10 mg by mouth daily. , Starting Tue 01/22/2016, Historical Med    clotrimazole (CLOTRIMAZOLE AF) 1 % cream Apply 1 application topically 2 (two) times daily., Starting Tue 03/18/2016, Normal    furosemide (LASIX) 20 MG tablet Take 1 tablet (20 mg total) by mouth daily., Starting Thu 04/17/2016, Normal    gabapentin (NEURONTIN) 300 MG capsule Take 1 capsule by mouth 2 (two) times daily. , Historical Med    ketoconazole (NIZORAL) 2 % cream Apply 1 application topically daily., Starting Tue 03/25/2016, Normal    Multiple Vitamins-Minerals (CENTRUM SILVER PO) Take 1 tablet by mouth daily., Historical Med    omeprazole (PRILOSEC)  20 MG capsule Take 1 capsule (20 mg total) by mouth 2 (two) times daily before a meal., Starting Tue 03/18/2016, Normal    glucose blood test strip Use as instructed, Normal    STOOL SOFTENER 100 MG capsule Take 100 mg by mouth 2 (two) times daily. , Starting Tue 01/22/2016, Historical Med      STOP taking these medications     fesoterodine (TOVIAZ) 8 MG TB24 tablet          DISCHARGE INSTRUCTIONS:    The patient is to follow-up with primary care physician, primary oncologist within 1 week after discharge  If you experience worsening of your admission symptoms, develop shortness of breath, life threatening emergency, suicidal or homicidal thoughts you must seek medical attention immediately by calling 911 or calling your MD immediately  if symptoms less severe.  You Must read complete instructions/literature along with all the possible adverse reactions/side effects for all the Medicines you take and that have been prescribed to you. Take any new Medicines after you have completely understood and accept all the possible adverse reactions/side effects.   Please note  You were cared for by a hospitalist during your hospital stay. If you have any questions about your discharge medications or the care you received while you were in the hospital after you are discharged, you can call the unit and asked to speak with the hospitalist on call if the hospitalist that took care of  you is not available. Once you are discharged, your primary care physician will handle any further medical issues. Please note that NO REFILLS for any discharge medications will be authorized once you are discharged, as it is imperative that you return to your primary care physician (or establish a relationship with a primary care physician if you do not have one) for your aftercare needs so that they can reassess your need for medications and monitor your lab values.    Today   CHIEF COMPLAINT:   Chief Complaint   Patient presents with  . Abdominal Pain    HISTORY OF PRESENT ILLNESS:  Miguel [name]  is a 83 y.o. male with a known history of esophageal cancer, anxiety, COPD, dementia, diabetes, gastroesophageal reflux disease, who presented to the hospital with complaints of upper abdominal pain, which was constant in the epigastric area and right upper quadrant. On arrival to the hospital. He had low-grade fever. In emergency room, patient had one way abdominal x-ray revealing adynamic ileus, CT of the chest, revealing left pleural effusion, The patient underwent thoracentesis on 06/03/16. Patient was given MiraLAX after which he had bowel movement and his abdominal pain resolved. The patient was noted to be in A. Fib with heart rate around 100. He was seen by cardiologist, diuresis was recommended with Lasix. Echocardiogram was normal. The patient was seen by thoracic surgeon, no Pleurx catheter or pleurodesis was recommended, diuretics, to be advanced as needed following patient clinically and weight. Repeated chest x-ray today revealed some reaccumulation of fluid on the left, but patient had been no worsening shortness of breath, oxygenation remained stable. The patient was seen by oncologist, Dr. Grayland Ormond, who recommended PET scan to be performed to rule out left thoracic pathology, however, due to recent thoracentesis PET scan was not performed. He was felt to be stable to be discharged home today. Discussion by problem: #1. Left pleural effusion,  Cytology was negative for malignancy, status post thoracentesis 06/03/2016 was 325 cc of clear yellowish fluid removed, continue Lasix, advanced doses needed, following clinically and the patient's weight, appreciate thoracic surgeon, cardiologist, and oncologist inputs,  most recent CT of the chest with contrast revealed subpleural thickening in the right middle lobe, likely treatment related, but no masses were noted, the patient clinically improved on  conservative therapy   #2 A. fib with mild acute diastolic congestive heart failure, continue Lasix, advanced doses needed, echocardiogram was normal with normal ejection fraction, no valvular abnormalities, appreciate cardiology's input, heart rate was stable, no RVR was noted #3. Adynamic ileus, etiology is unclear, suspected constipation related, resolved with MiraLAX, patient is eating 100% of offered meal, no abdominal pains #4. Demand ischemia, no further interventions by cardiologist, echocardiogram was normal #5. Chronic renal failure, likely CK D stage III, stable with therapy, . Follow creatinine as outpatient, urinalysis was unremarkable , postvoid bladder scan was normal     VITAL SIGNS:  Blood pressure 127/74, pulse 89, temperature 97.7 F (36.5 C), resp. rate 20, height 5\' 11"  (1.803 m), weight 108.9 kg (240 lb), SpO2 94 %.  I/O:   Intake/Output Summary (Last 24 hours) at 06/06/16 1607 Last data filed at 06/06/16 0900  Gross per 24 hour  Intake              483 ml  Output                0 ml  Net  483 ml    PHYSICAL EXAMINATION:  GENERAL:  83 y.o.-year-old patient lying in the bed with no acute distress.  EYES: Pupils equal, round, reactive to light and accommodation. No scleral icterus. Extraocular muscles intact.  HEENT: Head atraumatic, normocephalic. Oropharynx and nasopharynx clear.  NECK:  Supple, no jugular venous distention. No thyroid enlargement, no tenderness.  LUNGS: Normal breath sounds bilaterally, no wheezing, some basilar rales,rhonchi and crepitation, better air entrance. No use of accessory muscles of respiration.  CARDIOVASCULAR: S1, S2 normal. No murmurs, rubs, or gallops.  ABDOMEN: Soft, non-tender, non-distended. Bowel sounds present. No organomegaly or mass.  EXTREMITIES: No pedal edema, cyanosis, or clubbing.  NEUROLOGIC: Cranial nerves II through XII are intact. Muscle strength 5/5 in all extremities. Sensation intact. Gait not  checked.  PSYCHIATRIC: The patient is alert and oriented x 3.  SKIN: No obvious rash, lesion, or ulcer.   DATA REVIEW:   CBC  Recent Labs Lab 06/06/16 0526  WBC 4.9  HGB 12.9*  HCT 38.9*  PLT 139*    Chemistries   Recent Labs Lab 06/03/16 0837  06/06/16 0526  NA 138  < > 141  K 4.5  < > 4.1  CL 102  < > 103  CO2 30  < > 34*  GLUCOSE 148*  < > 112*  BUN 25*  < > 25*  CREATININE 1.76*  < > 1.62*  CALCIUM 8.5*  < > 9.0  AST 28  --   --   ALT 18  --   --   ALKPHOS 116  --   --   BILITOT 1.0  --   --   < > = values in this interval not displayed.  Cardiac Enzymes  Recent Labs Lab 06/04/16 0336  TROPONINI <0.03    Microbiology Results  Results for orders placed or performed during the hospital encounter of 06/03/16  Body fluid culture     Status: None   Collection Time: 06/03/16  1:27 PM  Result Value Ref Range Status   Specimen Description PLEURAL  Final   Special Requests NONE  Final   Gram Stain   Final    ABUNDANT WBC PRESENT,BOTH PMN AND MONONUCLEAR NO ORGANISMS SEEN    Culture   Final    NO GROWTH 3 DAYS Performed at [facility], [address], [place], [place] [address]    Report Status 06/06/2016 FINAL  Final    RADIOLOGY:  Dg Chest Port 1 View  Result Date: 06/05/2016 CLINICAL DATA:  83 year old male with abdominal pain and fever. Status post left thoracentesis on 06/03/2016. Personal history of esophageal cancer. Initial encounter. EXAM: PORTABLE CHEST 1 VIEW COMPARISON:  04/02/2017 and earlier. FINDINGS: Portable AP upright view at 0416 hours. Progressed veiling opacity in the left lung since the post thoracentesis images. At the same time there appears to be some volume loss on the left, with slight leftward shift of the mediastinum. The right lung remains clear allowing for portable technique. Stable visible mediastinal contours. Calcified aortic atherosclerosis. Stable right chest porta cath. Chronic right lateral rib fractures.  IMPRESSION: 1. Increasing veiling opacity and probably also volume loss in the left lung since the thoracentesis on 06/03/2016. Suspect some re-accumulation of left pleural effusion. 2. The right lung remains clear. Electronically Signed   By: Genevie Ann M.D.   On: 06/05/2016 08:27    EKG:   Orders placed or performed during the hospital encounter of 06/03/16  . ED EKG  . ED EKG  .  EKG 12-Lead  . EKG 12-Lead  . ED EKG  . ED EKG  . ED EKG  . ED EKG      Management plans discussed with the patient, family and they are in agreement.  CODE STATUS:     Code Status Orders        Start     Ordered   06/03/16 1533  Full code  Continuous     06/03/16 1533    Code Status History    Date Active Date Inactive Code Status Order ID Comments User Context   This patient has a current code status but no historical code status.      TOTAL TIME TAKING CARE OF THIS PATIENT: 40  minutes.    Theodoro Grist M.D on 06/06/2016 at 4:07 PM  Between 7am to 6pm - Pager - [phone]  After 6pm go to [email or web address] - password [email or web address] [facility]  Office  [phone]  CC: Primary care physician; Coral Spikes, DO

## 2016-06-06 NOTE — Progress Notes (Signed)
IV was removed. Discharge instructions and follow-up appointments  were provided to the pt, wife, and son at bedside. The pt was taken downstairs via wheelchair by volunteer services.

## 2016-06-06 NOTE — Care Management (Signed)
Patient to discharge home today.  Cheryl With [employer] notified.

## 2016-06-06 NOTE — Progress Notes (Signed)
  Patient ID: Miguel [name], male   DOB: Jun 18, 1929, 83 y.o.   MRN: [id number]  HISTORY: He is done quite well today. He states that he is being discharged today. He did see Dr. Grayland Ormond who recommended a PET scan and this will be performed as an outpatient. He denied any specific fevers or chills.   Vitals:   06/06/16 0507 06/06/16 0725  BP: 127/84 127/74  Pulse: 84 89  Resp: 20   Temp: 98.2 F (36.8 C) 97.7 F (36.5 C)     EXAM:    Resp: Lungs are clear bilaterally.  No respiratory distress, normal effort. Heart:  Regular without murmurs Abd:  Abdomen is soft, non distended and non tender. No masses are palpable.  There is no rebound and no guarding.  Neurological: Alert and oriented to person, place, and time. Coordination normal.  Skin: Skin is warm and dry. No rash noted. No diaphoretic. No erythema. No pallor.  Psychiatric: Normal mood and affect. Normal behavior. Judgment and thought content normal.    ASSESSMENT: Overall he looks quite well. I do not see any particular findings that would suggest a large recurrent pleural effusion   PLAN:   I would like to see the patient back again when he sees Dr. Grayland Ormond in follow-up. I will review the PET scan with metastases available.    Nestor Lewandowsky, MD

## 2016-06-09 ENCOUNTER — Other Ambulatory Visit: Payer: Self-pay | Admitting: Family Medicine

## 2016-06-09 MED ORDER — GABAPENTIN 300 MG PO CAPS: 300.0000 mg | ORAL_CAPSULE | Freq: Two times a day (BID) | ORAL | 1 refills | Status: DC

## 2016-06-09 NOTE — Telephone Encounter (Signed)
pts son called asking for a refill on medications. Please advise?

## 2016-06-10 ENCOUNTER — Telehealth: Payer: Self-pay | Admitting: Family Medicine

## 2016-06-10 NOTE — Telephone Encounter (Signed)
Needs appt

## 2016-06-10 NOTE — Telephone Encounter (Signed)
pts son called stating that pt has not been sleeping the last couple of nights. Ulice Dash stated pt was using xanax to help sleep but it has not helped. Ulice Dash stated that the pts dementia has gotten worse over the last couple of weeks and was wondering if you could call in a rx Aricept   Pt has been on this previously but stopped taking it. Ulice Dash hopes this will help.    Please advise if you'd like for them to schedule an appt. Ulice Dash also stated that pt was in the hospital for a couple of days recently.

## 2016-06-10 NOTE — Telephone Encounter (Signed)
LVTCB with pts son Ulice Dash. Pt will need to have an appt scheduled when son calls back.

## 2016-06-16 ENCOUNTER — Ambulatory Visit (INDEPENDENT_AMBULATORY_CARE_PROVIDER_SITE_OTHER): Payer: Medicare Other | Admitting: Family Medicine

## 2016-06-16 ENCOUNTER — Encounter: Payer: Self-pay | Admitting: Family Medicine

## 2016-06-16 VITALS — BP 97/63 | HR 79 | Temp 96.1°F | Wt 256.6 lb

## 2016-06-16 DIAGNOSIS — F0390 Unspecified dementia without behavioral disturbance: Secondary | ICD-10-CM

## 2016-06-16 DIAGNOSIS — I4891 Unspecified atrial fibrillation: Secondary | ICD-10-CM | POA: Diagnosis not present

## 2016-06-16 DIAGNOSIS — J90 Pleural effusion, not elsewhere classified: Secondary | ICD-10-CM

## 2016-06-16 MED ORDER — DONEPEZIL HCL 10 MG PO TABS: 10.0000 mg | ORAL_TABLET | Freq: Every day | ORAL | 1 refills | Status: DC

## 2016-06-16 MED ORDER — ALPRAZOLAM 0.25 MG PO TABS: ORAL_TABLET | ORAL | 0 refills | Status: DC

## 2016-06-16 MED ORDER — QUETIAPINE FUMARATE 50 MG PO TABS: 50.0000 mg | ORAL_TABLET | Freq: Every day | ORAL | 1 refills | Status: DC

## 2016-06-16 NOTE — Addendum Note (Signed)
Addended by: Coral Spikes on: 06/16/2016 11:03 PM   Modules accepted: Orders

## 2016-06-16 NOTE — Progress Notes (Signed)
Pre visit review using our clinic review tool, if applicable. No additional management support is needed unless otherwise documented below in the visit note. 

## 2016-06-16 NOTE — Assessment & Plan Note (Signed)
Worsening since stop of aricept. Restarting today. Tapering Xanax and starting Seroquel (for sleep and agitation).

## 2016-06-16 NOTE — Assessment & Plan Note (Signed)
New onset. Continue metoprolol. Referral to cardiology; need to consider anticoagulation.

## 2016-06-16 NOTE — Patient Instructions (Signed)
We will arrange the referral.  Medications as we discussed.  Follow up in 3 months.  Take care  Dr. Lacinda Axon

## 2016-06-16 NOTE — Assessment & Plan Note (Signed)
Advised follow up with Onc given that PET scan was recommended but not done.

## 2016-06-16 NOTE — Progress Notes (Addendum)
Subjective:  Patient ID: Miguel [name], male    DOB: 1929/05/17  Age: 83 y.o. MRN: [id number]  CC: Follow up, worsening dementia, insomnia/roaming at night  HPI:  83 year old male with COPD, Dementia, DM-2 presents for follow up.  Recent hospitalization  Presented with weakness, chest pain, abdominal pain.  Abdominal x-ray revealed adynamic ileus.  CT chest revealed pleural effusion. Patient underwent thoracentesis and cytology was negative. PET scan was initially recommended but was not done.  It has improved with MiraLAX.  Additionally, patient found to have atrial fibrillation. Associated congestive heart failure.  Echocardiogram unremarkable.  Was given metoprolol and diuresis.  Patient states that he's doing well since discharge.  He has not seen cardiology in follow-up.  He is not on any anticoagulation regarding new onset atrial fibrillation.  Dementia  Worsening per wife and son.   They have noticed a decline since Aricept was discontinued.  They would like to restart.  Wife states that he gets agitated and roams at night.  Wife states she's been giving him Xanax to help him sleep at night and is noticeable improvement.  She would like to discuss potential treatment options today.  Social Hx   Social History   Social History  . Marital status: Married    Spouse name: N/A  . Number of children: N/A  . Years of education: N/A   Social History Main Topics  . Smoking status: Former Research scientist (life sciences)  . Smokeless tobacco: Never Used  . Alcohol use No  . Drug use: No  . Sexual activity: Not Currently    Partners: Female   Other Topics Concern  . None   Social History Narrative  . None    Review of Systems  Respiratory: Negative for shortness of breath.   Cardiovascular: Negative.   Psychiatric/Behavioral: Positive for agitation, behavioral problems, confusion and sleep disturbance.   Objective:  BP 97/63   Pulse 79   Temp (!) 96.1 F (35.6 C)  (Axillary)   Wt 256 lb 9.6 oz (116.4 kg)   SpO2 97%   BMI 35.79 kg/m   BP/Weight 06/16/2016 06/06/2016 [id number]  Systolic BP 97 [id number] -  Diastolic BP 63 74 -  Wt. (Lbs) 256.6 - 240  BMI 35.79 - 33.47    Physical Exam  Constitutional: He appears well-developed. No distress.  Cardiovascular: Normal rate and regular rhythm.   Pulmonary/Chest: Effort normal and breath sounds normal.  Neurological: He is alert.  Psychiatric:  Flat affect.  Vitals reviewed.   Lab Results  Component Value Date   WBC 4.9 06/06/2016   HGB 12.9 (L) 06/06/2016   HCT 38.9 (L) 06/06/2016   PLT 139 (L) 06/06/2016   GLUCOSE 112 (H) 06/06/2016   CHOL 203 (H) 03/18/2016   TRIG 204.0 (H) 03/18/2016   HDL 48.40 03/18/2016   LDLDIRECT 108.0 03/18/2016   ALT 18 06/03/2016   AST 28 06/03/2016   NA 141 06/06/2016   K 4.1 06/06/2016   CL 103 06/06/2016   CREATININE 1.62 (H) 06/06/2016   BUN 25 (H) 06/06/2016   CO2 34 (H) 06/06/2016   TSH 1.986 06/03/2016   INR 1.24 06/03/2016   HGBA1C 5.4 03/18/2016    Assessment & Plan:   Problem List Items Addressed This Visit    Atrial fibrillation ([place]) - Primary    New onset. Continue metoprolol. Referral to cardiology; need to consider anticoagulation.      Relevant Orders   Ambulatory referral to Cardiology   Dementia  Worsening since stop of aricept. Restarting today. Tapering Xanax and starting Seroquel (for sleep and agitation).      Relevant Medications   donepezil (ARICEPT) 10 MG tablet   ALPRAZolam (XANAX) 0.25 MG tablet   QUEtiapine (SEROQUEL) 50 MG tablet   Pleural effusion    Advised follow up with Onc given that PET scan was recommended but not done.         Meds ordered this encounter  Medications  . donepezil (ARICEPT) 10 MG tablet    Sig: Take 1 tablet (10 mg total) by mouth at bedtime.    Dispense:  90 tablet    Refill:  1  . ALPRAZolam (XANAX) 0.25 MG tablet    Sig: 1 tablet at night x 1 week then 0.5 tablet x 1 week then  discontinue.    Dispense:  12 tablet    Refill:  0  . QUEtiapine (SEROQUEL) 50 MG tablet    Sig: Take 1 tablet (50 mg total) by mouth at bedtime.    Dispense:  90 tablet    Refill:  1    Follow-up: Return in about 3 months (around 09/16/2016).  [place]

## 2016-06-18 ENCOUNTER — Telehealth: Payer: Self-pay | Admitting: *Deleted

## 2016-06-18 NOTE — Telephone Encounter (Signed)
Asking if Dr Grayland Ormond needs to see patient post hospitalization and who was going to order a PET scan. He has no appts with [place]. Please advise

## 2016-06-18 NOTE — Telephone Encounter (Signed)
Yes, i'll see him next week.  Not sure if we need a PET at this time.  Will discuss at appt.

## 2016-06-19 ENCOUNTER — Telehealth: Payer: Self-pay | Admitting: *Deleted

## 2016-06-19 ENCOUNTER — Ambulatory Visit
Admission: RE | Admit: 2016-06-19 | Discharge: 2016-06-19 | Disposition: A | Discharge status: Home / Self Care | Payer: Medicare Other | Source: Ambulatory Visit | Attending: Urology | Admitting: Urology

## 2016-06-19 DIAGNOSIS — N2889 Other specified disorders of kidney and ureter: Secondary | ICD-10-CM

## 2016-06-19 HISTORY — PX: IR RADIOLOGIST EVAL & MGMT: IMG5224

## 2016-06-19 NOTE — Telephone Encounter (Signed)
Sonia Baller was called and given verbal orders.

## 2016-06-19 NOTE — Telephone Encounter (Signed)
Pt scheduled for 3/13 @ 10:30. Pt and son notified of the appt.

## 2016-06-19 NOTE — Telephone Encounter (Signed)
verbal okay to give?

## 2016-06-19 NOTE — Telephone Encounter (Signed)
Yes

## 2016-06-19 NOTE — Telephone Encounter (Signed)
Message sent to schedulers 

## 2016-06-19 NOTE — Telephone Encounter (Signed)
Patient needs appointment next week please

## 2016-06-19 NOTE — Telephone Encounter (Signed)
Sonia Baller from [place] has requested verbal orders for speech therapy, the testing will reflect on patients cognitive impairment. Patient currently has issues with recalling day old events.  Contact Sonia Baller [phone]

## 2016-06-22 NOTE — Progress Notes (Signed)
[place]  [phone]) [phone] [phone]) [phone]  ID: Miguel [name] OB: [birth year]  MR#: [id number]  IRJ#:[id number]  Patient Care Team: Coral Spikes, DO as PCP - General (Family Medicine)  CHIEF COMPLAINT: Stage IIIb esophageal cancer, right renal mass.  INTERVAL HISTORY: Patient returns to clinic today for further evaluation and hospital follow-up. He currently feels well and is back to his baseline. He denies any dysphagia or difficulty swallowing. He has no neurologic complaints, although admits to worsening memory. He denies any recent fevers or illnesses. He has no chest pain, but continues to have shortness of breath. He denies any nausea, vomiting, constipation, or diarrhea. He has no urinary complaints. Patient offers no specific complaints today.  REVIEW OF SYSTEMS:   Review of Systems  Constitutional: Negative.  Negative for fever, malaise/fatigue and weight loss.  Respiratory: Positive for shortness of breath. Negative for cough.   Cardiovascular: Negative.  Negative for chest pain and leg swelling.  Gastrointestinal: Negative.  Negative for abdominal pain, heartburn, nausea and vomiting.  Genitourinary: Negative.   Musculoskeletal: Negative.   Neurological: Negative.  Negative for weakness.  Psychiatric/Behavioral: Positive for memory loss. The patient is not nervous/anxious.     As per HPI. Otherwise, a complete review of systems is negative.  PAST MEDICAL HISTORY: Past Medical History:  Diagnosis Date  . Alcoholism ([place])   . Anxiety   . COPD (chronic obstructive pulmonary disease) ([place])   . Dementia 03/18/2016  . DM type 2 with diabetic peripheral neuropathy ([place]) 03/18/2016  . Esophageal cancer ([place])   . GERD (gastroesophageal reflux disease) 03/18/2016  . Hyperlipidemia   . Macular degeneration     PAST SURGICAL HISTORY: Past Surgical History:  Procedure Laterality Date  . BACK SURGERY    . CATARACT EXTRACTION    . KNEE SURGERY       FAMILY HISTORY: Family History  Problem Relation Age of Onset  . Ovarian cancer Sister   . Throat cancer Brother   . Diabetes Mother   . Kidney disease Father   . Prostate cancer Neg Hx   . Kidney cancer Neg Hx     ADVANCED DIRECTIVES (Y/N):  N  HEALTH MAINTENANCE: Social History  Substance Use Topics  . Smoking status: Former Research scientist (life sciences)  . Smokeless tobacco: Never Used  . Alcohol use No     Colonoscopy:  PAP:  Bone density:  Lipid panel:  No Known Allergies  Current Outpatient Prescriptions  Medication Sig Dispense Refill  . ADVAIR DISKUS 250-50 MCG/DOSE AEPB Inhale 1 puff into the lungs daily.    Marland Kitchen ALPRAZolam (XANAX) 0.25 MG tablet 1 tablet at night x 1 week then 0.5 tablet x 1 week then discontinue. 12 tablet 0  . aspirin 81 MG chewable tablet Chew 1 tablet (81 mg total) by mouth daily. 30 tablet 5  . atorvastatin (LIPITOR) 40 MG tablet Take 1 tablet (40 mg total) by mouth daily. 90 tablet 3  . cetirizine (ZYRTEC) 10 MG tablet Take 10 mg by mouth daily.     . clotrimazole (CLOTRIMAZOLE AF) 1 % cream Apply 1 application topically 2 (two) times daily. 60 g 3  . donepezil (ARICEPT) 10 MG tablet Take 1 tablet (10 mg total) by mouth at bedtime. 90 tablet 1  . furosemide (LASIX) 20 MG tablet Take 1 tablet (20 mg total) by mouth daily. 30 tablet 3  . gabapentin (NEURONTIN) 300 MG capsule Take 1 capsule (300 mg total) by mouth 2 (two) times daily. Dexter  capsule 1  . glucose blood test strip Use as instructed 100 each 12  . ketoconazole (NIZORAL) 2 % cream Apply 1 application topically daily. 60 g 1  . metoprolol tartrate (LOPRESSOR) 25 MG tablet Take 0.5 tablets (12.5 mg total) by mouth 2 (two) times daily. 60 tablet 6  . Multiple Vitamins-Minerals (CENTRUM SILVER PO) Take 1 tablet by mouth daily.    Marland Kitchen omeprazole (PRILOSEC) 20 MG capsule Take 1 capsule (20 mg total) by mouth 2 (two) times daily before a meal. 180 capsule 3  . polyethylene glycol (MIRALAX / GLYCOLAX) packet  Take 17 g by mouth daily. 14 each 0  . QUEtiapine (SEROQUEL) 50 MG tablet Take 1 tablet (50 mg total) by mouth at bedtime. 90 tablet 1  . STOOL SOFTENER 100 MG capsule Take 100 mg by mouth 2 (two) times daily.      No current facility-administered medications for this visit.     OBJECTIVE: Vitals:   06/24/16 1047  BP: 129/78  Pulse: 83  Resp: 18  Temp: (!) 96.6 F (35.9 C)     Body mass index is 36.05 kg/m.    ECOG FS:0 - Asymptomatic  General: Well-developed, well-nourished, no acute distress. Eyes: Pink conjunctiva, anicteric sclera. HEENT: Normocephalic, moist mucous membranes, clear oropharnyx. Lungs: Clear to auscultation bilaterally. Heart: Regular rate and rhythm. No rubs, murmurs, or gallops. Abdomen: Soft, nontender, nondistended. No organomegaly noted, normoactive bowel sounds. Musculoskeletal: No edema, cyanosis, or clubbing. Neuro: Alert, answering all questions appropriately. Cranial nerves grossly intact. Skin: No rashes or petechiae noted. Psych: Normal affect.  LAB RESULTS:  Lab Results  Component Value Date   NA 141 06/06/2016   K 4.1 06/06/2016   CL 103 06/06/2016   CO2 34 (H) 06/06/2016   GLUCOSE 112 (H) 06/06/2016   BUN 25 (H) 06/06/2016   CREATININE 1.62 (H) 06/06/2016   CALCIUM 9.0 06/06/2016   PROT 6.8 06/03/2016   ALBUMIN 3.7 06/03/2016   AST 28 06/03/2016   ALT 18 06/03/2016   ALKPHOS 116 06/03/2016   BILITOT 1.0 06/03/2016   GFRNONAA 37 (L) 06/06/2016   GFRAA 43 (L) 06/06/2016    Lab Results  Component Value Date   WBC 4.9 06/06/2016   NEUTROABS 3.6 05/12/2016   HGB 12.9 (L) 06/06/2016   HCT 38.9 (L) 06/06/2016   MCV 96.7 06/06/2016   PLT 139 (L) 06/06/2016     STUDIES: Dg Abdomen 1 View  Result Date: 06/03/2016 CLINICAL DATA:  Abdominal pain. EXAM: ABDOMEN - 1 VIEW COMPARISON:  CT 05/12/2016. FINDINGS: Soft tissue structures are unremarkable. Distended loops of small bowel noted. Colonic gas pattern is normal. No free air.  Degenerative changes thoracolumbar spine. Left base subsegmental atelectasis. IMPRESSION: 1. Distended loops of small bowel noted. Colonic gas pattern normal. Findings suggest mild adynamic ileus. 2. Left base subsegmental atelectasis again noted. Electronically Signed   By: Marcello Moores  Register   On: 06/03/2016 10:18   Ct Angio Chest Pe W And/or Wo Contrast  Result Date: 06/03/2016 CLINICAL DATA:  83 year old diabetic male with esophageal cancer and dementia presenting with abdominal pain for the past 2 days. Right-sided chest pain. Atrial fibrillation. Initial encounter. EXAM: CT ANGIOGRAPHY CHEST WITH CONTRAST TECHNIQUE: Multidetector CT imaging of the chest was performed using the standard protocol during bolus administration of intravenous contrast. Multiplanar CT image reconstructions and MIPs were obtained to evaluate the vascular anatomy. CONTRAST:  60 cc Isovue 370. COMPARISON:  05/12/2016. FINDINGS: Cardiovascular: No pulmonary embolus or aortic dissection. Right central line  tip right atrium. Non-opacified blood adjacent to the central line probably represents mixing of non-opacified blood rather than thrombus. Cardiomegaly. Small pericardial effusion/ pericardial thickening. Coronary artery calcifications. Mediastinum/Nodes: Esophageal wall thickening (most notable superior thoracic esophagus) similar to prior exam. No adenopathy. Lungs/Pleura: Interval development of small to moderate-size left-sided pleural effusion. Adjacent passive atelectasis. Post therapy changes medial aspect right lung. Upper Abdomen: No acute abnormality. Right renal mass noted on prior exam not of imaged on the current exam. Musculoskeletal: Thoracic kyphosis with flowing confluent anterior osteophyte without destructive lesion. Review of the MIP images confirms the above findings. IMPRESSION: No pulmonary embolus or aortic dissection. Interval development of small to moderate-size left-sided pleural effusion. Adjacent passive  atelectasis. Post therapy changes medial aspect right lung. Esophageal wall thickening (most notable superior thoracic esophagus) similar to prior exam. Cardiomegaly. Small pericardial effusion/pericardial thickening. Coronary artery calcifications. Electronically Signed   By: Genia Del M.D.   On: 06/03/2016 10:45   US Venous Img Lower Bilateral  Result Date: 06/03/2016 CLINICAL DATA:  Lower extremity pain for about 2 days EXAM: BILATERAL LOWER EXTREMITY VENOUS DOPPLER ULTRASOUND TECHNIQUE: Gray-scale sonography with graded compression, as well as color Doppler and duplex ultrasound were performed to evaluate the lower extremity deep venous systems from the level of the common femoral vein and including the common femoral, femoral, profunda femoral, popliteal and calf veins including the posterior tibial, peroneal and gastrocnemius veins when visible. The superficial great saphenous vein was also interrogated. Spectral Doppler was utilized to evaluate flow at rest and with distal augmentation maneuvers in the common femoral, femoral and popliteal veins. COMPARISON:  None. FINDINGS: RIGHT LOWER EXTREMITY Common Femoral Vein: No evidence of thrombus. Normal compressibility, respiratory phasicity and response to augmentation. Saphenofemoral Junction: No evidence of thrombus. Normal compressibility and flow on color Doppler imaging. Profunda Femoral Vein: No evidence of thrombus. Normal compressibility and flow on color Doppler imaging. Femoral Vein: No evidence of thrombus. Normal compressibility, respiratory phasicity and response to augmentation. Popliteal Vein: No evidence of thrombus. Normal compressibility, respiratory phasicity and response to augmentation. Calf Veins: No evidence of thrombus. Normal compressibility and flow on color Doppler imaging. Superficial Great Saphenous Vein: No evidence of thrombus. Normal compressibility and flow on color Doppler imaging. Venous Reflux:  None. Other Findings:   None. LEFT LOWER EXTREMITY Common Femoral Vein: No evidence of thrombus. Normal compressibility, respiratory phasicity and response to augmentation. Saphenofemoral Junction: No evidence of thrombus. Normal compressibility and flow on color Doppler imaging. Profunda Femoral Vein: No evidence of thrombus. Normal compressibility and flow on color Doppler imaging. Femoral Vein: No evidence of thrombus. Normal compressibility, respiratory phasicity and response to augmentation. Popliteal Vein: No evidence of thrombus. Normal compressibility, respiratory phasicity and response to augmentation. Calf Veins: No evidence of thrombus. Normal compressibility and flow on color Doppler imaging. Superficial Great Saphenous Vein: No evidence of thrombus. Normal compressibility and flow on color Doppler imaging. Venous Reflux:  None. Other Findings:  None. IMPRESSION: No evidence of deep venous thrombosis bilateral lower extremity. Electronically Signed   By: Lahoma Crocker M.D.   On: 06/03/2016 10:04   Dg Chest Port 1 View  Result Date: 06/05/2016 CLINICAL DATA:  83 year old male with abdominal pain and fever. Status post left thoracentesis on 06/03/2016. Personal history of esophageal cancer. Initial encounter. EXAM: PORTABLE CHEST 1 VIEW COMPARISON:  04/02/2017 and earlier. FINDINGS: Portable AP upright view at 0416 hours. Progressed veiling opacity in the left lung since the post thoracentesis images. At the same time there appears  to be some volume loss on the left, with slight leftward shift of the mediastinum. The right lung remains clear allowing for portable technique. Stable visible mediastinal contours. Calcified aortic atherosclerosis. Stable right chest porta cath. Chronic right lateral rib fractures. IMPRESSION: 1. Increasing veiling opacity and probably also volume loss in the left lung since the thoracentesis on 06/03/2016. Suspect some re-accumulation of left pleural effusion. 2. The right lung remains clear.  Electronically Signed   By: Genevie Ann M.D.   On: 06/05/2016 08:27   Dg Chest Port 1 View  Result Date: 06/03/2016 CLINICAL DATA:  S/p left thoracentesis EXAM: PORTABLE CHEST - 1 VIEW COMPARISON:  CT from earlier the same day FINDINGS: No pneumothorax. Persisting consolidation/atelectasis laterally at the left lung base. Right lung clear. Heart size upper limits normal for technique.  Atheromatous aorta. Can not exclude small residual left pleural effusion. Stable right IJ port catheter to the cavoatrial junction. Visualized bones unremarkable. IMPRESSION: 1. No pneumothorax post left thoracentesis. Electronically Signed   By: Lucrezia Europe M.D.   On: 06/03/2016 13:53   US Thoracentesis Asp Pleural Space W/img Guide  Result Date: 06/03/2016 CLINICAL DATA:  Shortness of Breath.  Left pleural effusion on CT. EXAM: EXAM THORACENTESIS WITH ULTRASOUND TECHNIQUE: The procedure, risks (including but not limited to bleeding, infection, organ damage ), benefits, and alternatives were explained to the family and patient. Questions regarding the procedure were encouraged and answered. The spouse understands and consents to the procedure. Survey ultrasound of the left hemithorax was performed and an appropriate skin entry site was localized. Site was marked, prepped with Betadine, draped in usual sterile fashion, infiltrated locally with 1% lidocaine. The Saf-T-Centesis needle was advanced into the pleural space. Clear yellow fluid returned. 325 mL was removed. Post procedure imaging shows no residual fluid. Sample sent for the requested laboratory studies. The patient tolerated procedure well. COMPLICATIONS: COMPLICATIONS None immediate IMPRESSION: Technically successful ultrasound-guided left thoracentesis. Follow-up chest radiograph shows no pneumothorax. Electronically Signed   By: Lucrezia Europe M.D.   On: 06/03/2016 13:55    ONCOLOGY HISTORY: Initial diagnosis was in approximately October 2016. This was followed by  concurrent chemotherapy with infusional 5-FU and XRT.  Patient received low-dose weekly carboplatinum and Taxol in approximately April and May 2017. He subsequently had a second recurrence and was initiated on Taxol 40 mg/m on days 1, 8, and 15 (Taxol was dose reduced 50% secondary to neuropathy) along with Ramucirumab 8 mg/m on days 1 and 8 starting September 27, 2015 through January 24, 2016. This was a 28 day cycle.   ASSESSMENT: Stage IIIb esophageal cancer, right renal mass  PLAN:    1. Stage IIIb esophageal cancer: Patient was noted to be HER-2 negative. Please see oncology history listed above. CT scan results from June 03, 2016 reviewed independently with no obvious evidence of recurrent or metastatic disease. Essentially unchanged from scans at outside facility dating back to November 19, 2015. Restaging PET scan was discussed with the patient was inpatient, but will not pursue this at this time. Return to clinic in 6 weeks for further evaluation and consideration of additional imaging. If patient has recurrent disease, would reinitiate dose reduced Taxol and Ramucirumab. 2. Neuropathy: Continue gabapentin and hydrocodone as prescribed. 3. Esophageal stricture: Patient has a history of multiple esophageal dilations. Will refer to GI in the future if necessary. 4. Right renal mass: CT scan results as above. Patient is likely going to have cryoablation of the lesion in the near future.  Approximately 30 minutes was spent in discussion of which greater than 50% was consultation.  Patient expressed understanding and was in agreement with this plan. He also understands that He can call clinic at any time with any questions, concerns, or complaints.   Cancer Staging Squamous cell esophageal cancer [facility]) Staging form: Esophagus - Squamous Cell Carcinoma, AJCC 7th Edition - Clinical stage from 02/27/2016: Stage IIIB (T3, N2, M0) - Signed by Lloyd Huger, MD on 02/27/2016   Lloyd Huger, MD   06/24/2016 11:06 AM

## 2016-06-23 ENCOUNTER — Telehealth: Payer: Self-pay | Admitting: Family Medicine

## 2016-06-23 NOTE — Telephone Encounter (Signed)
Son was called and given information about sugars. He was in agreement.

## 2016-06-23 NOTE — Telephone Encounter (Signed)
pts wife stated that pts blood sugar was 234 this morning. Last night it was measured at 418. pts wife is concerned this could be coming from new medication.

## 2016-06-23 NOTE — Telephone Encounter (Signed)
It can be, but not typically this quick. If persists, let me know.

## 2016-06-24 ENCOUNTER — Inpatient Hospital Stay: Payer: Medicare Other

## 2016-06-24 ENCOUNTER — Inpatient Hospital Stay: Payer: Medicare Other | Attending: Oncology | Admitting: Oncology

## 2016-06-24 VITALS — BP 129/78 | HR 83 | Temp 96.6°F | Resp 18 | Wt 258.5 lb

## 2016-06-24 DIAGNOSIS — F1020 Alcohol dependence, uncomplicated: Secondary | ICD-10-CM | POA: Insufficient documentation

## 2016-06-24 DIAGNOSIS — N2889 Other specified disorders of kidney and ureter: Secondary | ICD-10-CM | POA: Insufficient documentation

## 2016-06-24 DIAGNOSIS — H3530 Unspecified macular degeneration: Secondary | ICD-10-CM | POA: Diagnosis not present

## 2016-06-24 DIAGNOSIS — Z87891 Personal history of nicotine dependence: Secondary | ICD-10-CM | POA: Insufficient documentation

## 2016-06-24 DIAGNOSIS — Z8041 Family history of malignant neoplasm of ovary: Secondary | ICD-10-CM | POA: Diagnosis not present

## 2016-06-24 DIAGNOSIS — Z7982 Long term (current) use of aspirin: Secondary | ICD-10-CM | POA: Insufficient documentation

## 2016-06-24 DIAGNOSIS — F0390 Unspecified dementia without behavioral disturbance: Secondary | ICD-10-CM | POA: Insufficient documentation

## 2016-06-24 DIAGNOSIS — Z452 Encounter for adjustment and management of vascular access device: Secondary | ICD-10-CM | POA: Diagnosis not present

## 2016-06-24 DIAGNOSIS — Z79899 Other long term (current) drug therapy: Secondary | ICD-10-CM | POA: Diagnosis not present

## 2016-06-24 DIAGNOSIS — F419 Anxiety disorder, unspecified: Secondary | ICD-10-CM | POA: Diagnosis not present

## 2016-06-24 DIAGNOSIS — R413 Other amnesia: Secondary | ICD-10-CM | POA: Insufficient documentation

## 2016-06-24 DIAGNOSIS — E785 Hyperlipidemia, unspecified: Secondary | ICD-10-CM | POA: Insufficient documentation

## 2016-06-24 DIAGNOSIS — J449 Chronic obstructive pulmonary disease, unspecified: Secondary | ICD-10-CM | POA: Insufficient documentation

## 2016-06-24 DIAGNOSIS — G629 Polyneuropathy, unspecified: Secondary | ICD-10-CM | POA: Insufficient documentation

## 2016-06-24 DIAGNOSIS — K219 Gastro-esophageal reflux disease without esophagitis: Secondary | ICD-10-CM | POA: Diagnosis not present

## 2016-06-24 DIAGNOSIS — Z95828 Presence of other vascular implants and grafts: Secondary | ICD-10-CM

## 2016-06-24 DIAGNOSIS — C159 Malignant neoplasm of esophagus, unspecified: Secondary | ICD-10-CM | POA: Insufficient documentation

## 2016-06-24 DIAGNOSIS — Z800 Family history of malignant neoplasm of digestive organs: Secondary | ICD-10-CM | POA: Diagnosis not present

## 2016-06-24 MED ORDER — HEPARIN SOD (PORK) LOCK FLUSH 100 UNIT/ML IV SOLN
INTRAVENOUS | Status: AC
  Filled 2016-06-24: qty 5

## 2016-06-24 MED ORDER — SODIUM CHLORIDE 0.9% FLUSH
10.0000 mL | INTRAVENOUS | Status: DC | PRN
  Administered 2016-06-24: 10 mL via INTRAVENOUS
  Filled 2016-06-24: qty 10

## 2016-06-24 MED ORDER — HEPARIN SOD (PORK) LOCK FLUSH 100 UNIT/ML IV SOLN
500.0000 [IU] | Freq: Once | INTRAVENOUS | Status: AC
  Administered 2016-06-24: 500 [IU] via INTRAVENOUS

## 2016-06-24 NOTE — Progress Notes (Signed)
Complains of pain in back of neck, feeling weak today.

## 2016-06-25 ENCOUNTER — Telehealth: Payer: Self-pay | Admitting: *Deleted

## 2016-06-25 NOTE — Consult Note (Signed)
Chief Complaint: [name] was seen in consultation today for right renal mass ablation at Miguel request of [place]  Referring Physician(s): Brandon,Ashley  History of Present Illness: Miguel [name] is a 83 y.o. male with a history of treated stage IIIb esophageal carcinoma who recently moved and transferred oncologic care to Dr. Alyssa Grove.  CT on 05/12/2016 demonstrated a 3 cm mass emanating off of Miguel lower pole cortex of Miguel right kidney.  There is no evidence of metastatic disease in Miguel chest, abdomen or pelvis.  Miguel [name] has a history of chronic kidney disease, stage III. Miguel [name] is currently asymptomatic and has no evidence of disease recurrence at Miguel site of treated esophageal carcinoma.  Miguel [name] recently was admitted in February with new onset atrial fibrillation, heart failure and a left pleural effusion requiring thoracentesis. Miguel [name] is scheduled to see Dr. Gerald Stabs End on 07/02/16 for Cardiology evaluation.  Past Medical History:  Diagnosis Date  . Alcoholism ([place])   . Anxiety   . COPD (chronic obstructive pulmonary disease) ([place])   . Dementia 03/18/2016  . DM type 2 with diabetic peripheral neuropathy ([place]) 03/18/2016  . Esophageal cancer ([place])   . GERD (gastroesophageal reflux disease) 03/18/2016  . Hyperlipidemia   . Macular degeneration     Past Surgical History:  Procedure Laterality Date  . BACK SURGERY    . CATARACT EXTRACTION    . KNEE SURGERY      Allergies: [name] has no known allergies.  Medications: Prior to Admission medications   Medication Sig Start Date End Date Taking? Authorizing Provider  ADVAIR DISKUS 250-50 MCG/DOSE AEPB Inhale 1 puff into Miguel lungs daily. 01/22/16  Yes Historical Provider, MD  ALPRAZolam (XANAX) 0.25 MG tablet 1 tablet at night x 1 week then 0.5 tablet x 1 week then discontinue. 06/16/16  Yes Coral Spikes, DO  aspirin 81 MG chewable tablet Chew 1 tablet (81 mg total) by mouth daily. 06/07/16  Yes Theodoro Grist, MD  atorvastatin  (LIPITOR) 40 MG tablet Take 1 tablet (40 mg total) by mouth daily. 03/20/16  Yes Coral Spikes, DO  cetirizine (ZYRTEC) 10 MG tablet Take 10 mg by mouth daily.  01/22/16  Yes Historical Provider, MD  clotrimazole (CLOTRIMAZOLE AF) 1 % cream Apply 1 application topically 2 (two) times daily. 03/18/16  Yes Jayce G Cook, DO  donepezil (ARICEPT) 10 MG tablet Take 1 tablet (10 mg total) by mouth at bedtime. 06/16/16  Yes Coral Spikes, DO  furosemide (LASIX) 20 MG tablet Take 1 tablet (20 mg total) by mouth daily. 04/17/16  Yes Coral Spikes, DO  gabapentin (NEURONTIN) 300 MG capsule Take 1 capsule (300 mg total) by mouth 2 (two) times daily. 06/09/16  Yes Coral Spikes, DO  glucose blood test strip Use as instructed 04/17/16  Yes Jayce G Cook, DO  ketoconazole (NIZORAL) 2 % cream Apply 1 application topically daily. 03/25/16  Yes Coral Spikes, DO  metoprolol tartrate (LOPRESSOR) 25 MG tablet Take 0.5 tablets (12.5 mg total) by mouth 2 (two) times daily. 06/06/16  Yes Theodoro Grist, MD  Multiple Vitamins-Minerals (CENTRUM SILVER PO) Take 1 tablet by mouth daily.   Yes Historical Provider, MD  omeprazole (PRILOSEC) 20 MG capsule Take 1 capsule (20 mg total) by mouth 2 (two) times daily before a meal. 03/18/16  Yes Coral Spikes, DO  polyethylene glycol (MIRALAX / GLYCOLAX) packet Take 17 g by mouth daily. 06/07/16  Yes Theodoro Grist, MD  QUEtiapine (SEROQUEL) 50  MG tablet Take 1 tablet (50 mg total) by mouth at bedtime. 06/16/16  Yes Jayce G Cook, DO  STOOL SOFTENER 100 MG capsule Take 100 mg by mouth 2 (two) times daily.  01/22/16  Yes Historical Provider, MD     Family History  Problem Relation Age of Onset  . Ovarian cancer Sister   . Throat cancer Brother   . Diabetes Mother   . Kidney disease Father   . Prostate cancer Neg Hx   . Kidney cancer Neg Hx     Social History   Social History  . Marital status: Married    [name] name: N/A  . Number of children: N/A  . Years of education: N/A   Social History  Main Topics  . Smoking status: Former Research scientist (life sciences)  . Smokeless tobacco: Never Used  . Alcohol use No  . Drug use: No  . Sexual activity: Not Currently    Partners: Female   Other Topics Concern  . Not on file   Social History Narrative  . No narrative on file    ECOG Status: 0 - Asymptomatic  Review of Systems: A 12 point ROS discussed and pertinent positives are indicated in Miguel HPI above.  All other systems are negative.  Review of Systems  Constitutional: Negative.   HENT: Negative.   Respiratory: Negative.   Cardiovascular: Negative.   Gastrointestinal: Negative.   Genitourinary: Negative.   Musculoskeletal: Negative.   Neurological: Negative.   Hematological: Negative.     Vital Signs: BP (!) 98/56 (BP Location: Left Arm, [name] Position: Sitting, Cuff Size: Large)   Pulse 71   Temp 98.3 F (36.8 C) (Oral)   Resp 15   Ht 5' 11"  (1.803 m)   Wt 256 lb (116.1 kg)   SpO2 96%   BMI 35.70 kg/m   Physical Exam  Constitutional: Miguel [name] is oriented to person, place, and time. Miguel [name] appears well-developed and well-nourished. No distress.  HENT:  Head: Normocephalic and atraumatic.  Neck: Neck supple. No JVD present.  Cardiovascular: Exam reveals no friction rub.   No murmur heard. Irregular rate and rhythm.   Pulmonary/Chest: Effort normal. No stridor. No respiratory distress. Miguel [name] has no wheezes. Miguel [name] has no rales.  Slightly diminished breath sounds on left compared to right.  Abdominal: Soft. Bowel sounds are normal. Miguel [name] exhibits no distension and no mass. There is no tenderness. There is no rebound and no guarding.  Musculoskeletal: Miguel [name] exhibits no edema.  Lymphadenopathy:    Miguel [name] has no cervical adenopathy.  Neurological: Miguel [name] is alert and oriented to person, place, and time.  Skin: Miguel [name] is not diaphoretic.     Imaging: Dg Abdomen 1 View  Result Date: 06/03/2016 CLINICAL DATA:  Abdominal pain. EXAM: ABDOMEN - 1 VIEW COMPARISON:  CT 05/12/2016. FINDINGS: Soft tissue  structures are unremarkable. Distended loops of small bowel noted. Colonic gas pattern is normal. No free air. Degenerative changes thoracolumbar spine. Left base subsegmental atelectasis. IMPRESSION: 1. Distended loops of small bowel noted. Colonic gas pattern normal. Findings suggest mild adynamic ileus. 2. Left base subsegmental atelectasis again noted. Electronically Signed   By: Marcello Moores  Register   On: 06/03/2016 10:18   Ct Angio Chest Pe W And/or Wo Contrast  Result Date: 06/03/2016 CLINICAL DATA:  83 year old diabetic male with esophageal cancer and dementia presenting with abdominal pain for Miguel past 2 days. Right-sided chest pain. Atrial fibrillation. Initial encounter. EXAM: CT ANGIOGRAPHY CHEST WITH CONTRAST TECHNIQUE: Multidetector CT imaging of Miguel chest was performed using  Miguel standard protocol during bolus administration of intravenous contrast. Multiplanar CT image reconstructions and MIPs were obtained to evaluate Miguel vascular anatomy. CONTRAST:  60 cc Isovue 370. COMPARISON:  05/12/2016. FINDINGS: Cardiovascular: No pulmonary embolus or aortic dissection. Right central line tip right atrium. Non-opacified blood adjacent to Miguel central line probably represents mixing of non-opacified blood rather than thrombus. Cardiomegaly. Small pericardial effusion/ pericardial thickening. Coronary artery calcifications. Mediastinum/Nodes: Esophageal wall thickening (most notable superior thoracic esophagus) similar to prior exam. No adenopathy. Lungs/Pleura: Interval development of small to moderate-size left-sided pleural effusion. Adjacent passive atelectasis. Post therapy changes medial aspect right lung. Upper Abdomen: No acute abnormality. Right renal mass noted on prior exam not of imaged on Miguel current exam. Musculoskeletal: Thoracic kyphosis with flowing confluent anterior osteophyte without destructive lesion. Review of Miguel MIP images confirms Miguel above findings. IMPRESSION: No pulmonary embolus or  aortic dissection. Interval development of small to moderate-size left-sided pleural effusion. Adjacent passive atelectasis. Post therapy changes medial aspect right lung. Esophageal wall thickening (most notable superior thoracic esophagus) similar to prior exam. Cardiomegaly. Small pericardial effusion/pericardial thickening. Coronary artery calcifications. Electronically Signed   By: Genia Del M.D.   On: 06/03/2016 10:45   US Venous Img Lower Bilateral  Result Date: 06/03/2016 CLINICAL DATA:  Lower extremity pain for about 2 days EXAM: BILATERAL LOWER EXTREMITY VENOUS DOPPLER ULTRASOUND TECHNIQUE: Gray-scale sonography with graded compression, as well as color Doppler and duplex ultrasound were performed to evaluate Miguel lower extremity deep venous systems from Miguel level of Miguel common femoral vein and including Miguel common femoral, femoral, profunda femoral, popliteal and calf veins including Miguel posterior tibial, peroneal and gastrocnemius veins when visible. Miguel superficial great saphenous vein was also interrogated. Spectral Doppler was utilized to evaluate flow at rest and with distal augmentation maneuvers in Miguel common femoral, femoral and popliteal veins. COMPARISON:  None. FINDINGS: RIGHT LOWER EXTREMITY Common Femoral Vein: No evidence of thrombus. Normal compressibility, respiratory phasicity and response to augmentation. Saphenofemoral Junction: No evidence of thrombus. Normal compressibility and flow on color Doppler imaging. Profunda Femoral Vein: No evidence of thrombus. Normal compressibility and flow on color Doppler imaging. Femoral Vein: No evidence of thrombus. Normal compressibility, respiratory phasicity and response to augmentation. Popliteal Vein: No evidence of thrombus. Normal compressibility, respiratory phasicity and response to augmentation. Calf Veins: No evidence of thrombus. Normal compressibility and flow on color Doppler imaging. Superficial Great Saphenous Vein: No evidence  of thrombus. Normal compressibility and flow on color Doppler imaging. Venous Reflux:  None. Other Findings:  None. LEFT LOWER EXTREMITY Common Femoral Vein: No evidence of thrombus. Normal compressibility, respiratory phasicity and response to augmentation. Saphenofemoral Junction: No evidence of thrombus. Normal compressibility and flow on color Doppler imaging. Profunda Femoral Vein: No evidence of thrombus. Normal compressibility and flow on color Doppler imaging. Femoral Vein: No evidence of thrombus. Normal compressibility, respiratory phasicity and response to augmentation. Popliteal Vein: No evidence of thrombus. Normal compressibility, respiratory phasicity and response to augmentation. Calf Veins: No evidence of thrombus. Normal compressibility and flow on color Doppler imaging. Superficial Great Saphenous Vein: No evidence of thrombus. Normal compressibility and flow on color Doppler imaging. Venous Reflux:  None. Other Findings:  None. IMPRESSION: No evidence of deep venous thrombosis bilateral lower extremity. Electronically Signed   By: Lahoma Crocker M.D.   On: 06/03/2016 10:04   Dg Chest Port 1 View  Result Date: 06/05/2016 CLINICAL DATA:  83 year old male with abdominal pain and fever. Status post left thoracentesis on 06/03/2016. Personal  history of esophageal cancer. Initial encounter. EXAM: PORTABLE CHEST 1 VIEW COMPARISON:  04/02/2017 and earlier. FINDINGS: Portable AP upright view at 0416 hours. Progressed veiling opacity in Miguel left lung since Miguel post thoracentesis images. At Miguel same time there appears to be some volume loss on Miguel left, with slight leftward shift of Miguel mediastinum. Miguel right lung remains clear allowing for portable technique. Stable visible mediastinal contours. Calcified aortic atherosclerosis. Stable right chest porta cath. Chronic right lateral rib fractures. IMPRESSION: 1. Increasing veiling opacity and probably also volume loss in Miguel left lung since Miguel thoracentesis  on 06/03/2016. Suspect some re-accumulation of left pleural effusion. 2. Miguel right lung remains clear. Electronically Signed   By: Genevie Ann M.D.   On: 06/05/2016 08:27   Dg Chest Port 1 View  Result Date: 06/03/2016 CLINICAL DATA:  S/p left thoracentesis EXAM: PORTABLE CHEST - 1 VIEW COMPARISON:  CT from earlier Miguel same day FINDINGS: No pneumothorax. Persisting consolidation/atelectasis laterally at Miguel left lung base. Right lung clear. Heart size upper limits normal for technique.  Atheromatous aorta. Can not exclude small residual left pleural effusion. Stable right IJ port catheter to Miguel cavoatrial junction. Visualized bones unremarkable. IMPRESSION: 1. No pneumothorax post left thoracentesis. Electronically Signed   By: Lucrezia Europe M.D.   On: 06/03/2016 13:53   US Thoracentesis Asp Pleural Space W/img Guide  Result Date: 06/03/2016 CLINICAL DATA:  Shortness of Breath.  Left pleural effusion on CT. EXAM: EXAM THORACENTESIS WITH ULTRASOUND TECHNIQUE: Miguel procedure, risks (including but not limited to bleeding, infection, organ damage ), benefits, and alternatives were explained to Miguel family and [name]. Questions regarding Miguel procedure were encouraged and answered. Miguel [name] understands and consents to Miguel procedure. Survey ultrasound of Miguel left hemithorax was performed and an appropriate skin entry site was localized. Site was marked, prepped with Betadine, draped in usual sterile fashion, infiltrated locally with 1% lidocaine. Miguel Saf-T-Centesis needle was advanced into Miguel pleural space. Clear yellow fluid returned. 325 mL was removed. Post procedure imaging shows no residual fluid. Sample sent for Miguel requested laboratory studies. Miguel [name] tolerated procedure well. COMPLICATIONS: COMPLICATIONS None immediate IMPRESSION: Technically successful ultrasound-guided left thoracentesis. Follow-up chest radiograph shows no pneumothorax. Electronically Signed   By: Lucrezia Europe M.D.   On: 06/03/2016 13:55      Labs:  CBC:  Recent Labs  05/12/16 1003 06/03/16 0837 06/05/16 0506 06/06/16 0526  WBC 5.1 8.8 5.6 4.9  HGB 14.6 14.0 12.7* 12.9*  HCT 43.3 40.9 37.2* 38.9*  PLT 158 116* 126* 139*    COAGS:  Recent Labs  06/03/16 1027  INR 1.24  APTT 33    BMP:  Recent Labs  05/12/16 1003 06/03/16 0837 06/05/16 0506 06/06/16 0526  NA 136 138 140 141  K 4.4 4.5 4.0 4.1  CL 101 102 105 103  CO2 29 30 30  34*  GLUCOSE 104* 148* 107* 112*  BUN 21* 25* 28* 25*  CALCIUM 9.1 8.5* 8.5* 9.0  CREATININE 1.44* 1.76* 1.58* 1.62*  GFRNONAA 43* 34* 38* 37*  GFRAA 50* 39* 45* 43*    LIVER FUNCTION TESTS:  Recent Labs  03/18/16 1432 05/12/16 1003 06/03/16 0837  BILITOT 0.9 0.7 1.0  AST 19 28 28   ALT 10 17 18   ALKPHOS 76 91 116  PROT 6.2 6.7 6.8  ALBUMIN 3.7 3.7 3.7    TUMOR MARKERS:  Recent Labs  05/12/16 1003  CEA 1.4  CA199 7    Assessment and Plan:  I met with Miguel [name], his wife, son and daughter-in-law.  We reviewed imaging findings on Miguel CT on 1/29. There is a solid, rounded, enhancing tumor present that I measure at 2.8 cm in diameter.    Miguel tumor is present, in retrospect, on an outside scan dated 02/11/16 from [facility] and measures 2.3 cm on that scan by my measurement.  Given significant chronic kidney disease and other comorbid conditions, Miguel [name] is not an optimal candidate for partial nephrectomy and requires maximal nephron sparing.  Miguel lesion is of size and location amenable to percutaneous cryoablation.  There is a high suspicion of malignancy based on CT, but I did recommend we obtain an MRI with half dose gadolinium to better characterize size, dimensions and enhancement pattern of Miguel renal mass.  There are also some intermediate density cystic lesions of both kidneys that would be better able to be characterized by MRI.  Details of cryoablation were discussed with Miguel [name] and his family.  Miguel procedure does require general  anesthesia, so I would like to obtain Dr. Darnelle Bos recommendation regarding any treatment and/or clearance necessary from a cardiac standpoint prior to proceeding.    Separate biopsy is not necessary and can be performed at Miguel time of ablation to establish a tissue diagnosis.  After discussion, Miguel [name] and his family would like to proceed with scheduling an ablation procedure.  We will obtain Miguel abdominal MRI first and await Dr. Darnelle Bos cardiac evaluation and recommendations.  Thank you for this interesting consult.  I greatly enjoyed meeting Miguel [name] and look forward to participating in their care.  A copy of this report was sent to Miguel requesting provider on this date.  Electronically SignedAletta Edouard T 06/25/2016, 3:42 PM     I spent a total of 60 Minutes  in face to face in clinical consultation, greater than 50% of which was counseling/coordinating care for evaluation and ablation of a right renal tumor.

## 2016-06-25 NOTE — Telephone Encounter (Signed)
Miguel [name] -Speech Therapy has requested therapy Twice a week for 4 week starting next week ,  for memory and swallowing difficulty.  Contact Santiago Glad [phone]

## 2016-06-26 ENCOUNTER — Other Ambulatory Visit (HOSPITAL_COMMUNITY): Payer: Self-pay | Admitting: Interventional Radiology

## 2016-06-26 ENCOUNTER — Other Ambulatory Visit: Payer: Self-pay | Admitting: *Deleted

## 2016-06-26 DIAGNOSIS — N2889 Other specified disorders of kidney and ureter: Secondary | ICD-10-CM

## 2016-06-26 NOTE — Telephone Encounter (Signed)
Santiago Glad called back and was given verbal okay.

## 2016-06-26 NOTE — Telephone Encounter (Signed)
Yes

## 2016-06-26 NOTE — Telephone Encounter (Signed)
LVTCB

## 2016-06-26 NOTE — Telephone Encounter (Signed)
Verbal okay to give? 

## 2016-06-30 ENCOUNTER — Telehealth: Payer: Self-pay | Admitting: Family Medicine

## 2016-06-30 NOTE — Telephone Encounter (Signed)
Scott called and verbal orders given.

## 2016-06-30 NOTE — Telephone Encounter (Signed)
Verbal order okay to give

## 2016-06-30 NOTE — Telephone Encounter (Signed)
Yes

## 2016-06-30 NOTE — Telephone Encounter (Signed)
Scott form [facility] wants to extend the patient's PT starting April 10th 2 week x 2 and 1 week x1.

## 2016-07-02 ENCOUNTER — Encounter: Payer: Self-pay | Admitting: Internal Medicine

## 2016-07-02 ENCOUNTER — Ambulatory Visit (INDEPENDENT_AMBULATORY_CARE_PROVIDER_SITE_OTHER): Payer: Medicare Other | Admitting: Internal Medicine

## 2016-07-02 VITALS — BP 116/62 | HR 78 | Ht 71.0 in | Wt 254.8 lb

## 2016-07-02 DIAGNOSIS — I4819 Other persistent atrial fibrillation: Secondary | ICD-10-CM

## 2016-07-02 DIAGNOSIS — I481 Persistent atrial fibrillation: Secondary | ICD-10-CM | POA: Diagnosis not present

## 2016-07-02 DIAGNOSIS — R0609 Other forms of dyspnea: Secondary | ICD-10-CM | POA: Diagnosis not present

## 2016-07-02 DIAGNOSIS — I5030 Unspecified diastolic (congestive) heart failure: Secondary | ICD-10-CM

## 2016-07-02 DIAGNOSIS — Z01810 Encounter for preprocedural cardiovascular examination: Secondary | ICD-10-CM

## 2016-07-02 NOTE — Patient Instructions (Addendum)
Medication Instructions:  Your physician recommends that you continue on your current medications as directed. Please refer to the Current Medication list given to you today.   Labwork: none  Testing/Procedures: Your physician has requested that you have a lexiscan myoview. For further information please visit [email or web address]. Please follow instruction sheet, as given.  [place]  Your caregiver has ordered a Stress Test with nuclear imaging. The purpose of this test is to evaluate the blood supply to your heart muscle. This procedure is referred to as a "Non-Invasive Stress Test." This is because other than having an IV started in your vein, nothing is inserted or "invades" your body. Cardiac stress tests are done to find areas of poor blood flow to the heart by determining the extent of coronary artery disease (CAD). Some patients exercise on a treadmill, which naturally increases the blood flow to your heart, while others who are  unable to walk on a treadmill due to physical limitations have a pharmacologic/chemical stress agent called Lexiscan . This medicine will mimic walking on a treadmill by temporarily increasing your coronary blood flow.   Please note: these test may take anywhere between 2-4 hours to complete  PLEASE REPORT TO [place] AT THE FIRST DESK WILL DIRECT YOU WHERE TO GO  Date of Procedure:_______3/29/18____  Arrival Time for Procedure:________07:45 am________  Instructions regarding medication:   YES  : Hold diabetes medication, METFORMIN, the morning of procedure  YES_:  Hold METOPROLOL night before procedure and morning of procedure  YES_:  Hold other medications as follows:    FUROSEMIDE the morning of the procedure.   PLEASE NOTIFY THE OFFICE AT LEAST 66 HOURS IN ADVANCE IF YOU ARE UNABLE TO KEEP YOUR APPOINTMENT.  [phone] AND  PLEASE NOTIFY NUCLEAR MEDICINE AT [facility] AT LEAST 24 HOURS IN ADVANCE IF YOU ARE  UNABLE TO KEEP YOUR APPOINTMENT. [phone]  How to prepare for your Myoview test:  1. Do not eat or drink after midnight 2. No caffeine for 24 hours prior to test 3. No smoking 24 hours prior to test. 4. Your medication may be taken with water.  If your doctor stopped a medication because of this test, do not take that medication. 5. Ladies, please do not wear dresses.  Skirts or pants are appropriate. Please wear a short sleeve shirt. 6. No perfume, cologne or lotion. 7. Wear comfortable walking shoes. No heels!    Follow-Up: Your physician recommends that you schedule a follow-up appointment in: 3 MONTHS WITH DR END.   If you need a refill on your cardiac medications before your next appointment, please call your pharmacy.  Cardiac Nuclear Scan A cardiac nuclear scan is a test that measures blood flow to the heart when a person is resting and when he or she is exercising. The test looks for problems such as:  Not enough blood reaching a portion of the heart.  The heart muscle not working normally. You may need this test if:  You have heart disease.  You have had abnormal lab results.  You have had heart surgery or angioplasty.  You have chest pain.  You have shortness of breath. In this test, a radioactive dye (tracer) is injected into your bloodstream. After the tracer has traveled to your heart, an imaging device is used to measure how much of the tracer is absorbed by or distributed to various areas of your heart. This procedure is usually done at a hospital and takes 2-4 hours.  Tell a health care provider about:  Any allergies you have.  All medicines you are taking, including vitamins, herbs, eye drops, creams, and over-the-counter medicines.  Any problems you or family members have had with the use of anesthetic medicines.  Any blood disorders you have.  Any surgeries you have had.  Any medical conditions you have.  Whether you are pregnant or may be  pregnant. What are the risks? Generally, this is a safe procedure. However, problems may occur, including:  Serious chest pain and heart attack. This is only a risk if the stress portion of the test is done.  Rapid heartbeat.  Sensation of warmth in your chest. This usually passes quickly. What happens before the procedure?  Ask your health care provider about changing or stopping your regular medicines. This is especially important if you are taking diabetes medicines or blood thinners.  Remove your jewelry on the day of the procedure. What happens during the procedure?  An IV tube will be inserted into one of your veins.  Your health care provider will inject a small amount of radioactive tracer through the tube.  You will wait for 20-40 minutes while the tracer travels through your bloodstream.  Your heart activity will be monitored with an electrocardiogram (ECG).  You will lie down on an exam table.  Images of your heart will be taken for about 15-20 minutes.  You may be asked to exercise on a treadmill or stationary bike. While you exercise, your heart's activity will be monitored with an ECG, and your blood pressure will be checked. If you are unable to exercise, you may be given a medicine to increase blood flow to parts of your heart.  When blood flow to your heart has peaked, a tracer will again be injected through the IV tube.  After 20-40 minutes, you will get back on the exam table and have more images taken of your heart.  When the procedure is over, your IV tube will be removed. The procedure may vary among health care providers and hospitals. Depending on the type of tracer used, scans may need to be repeated 3-4 hours later. What happens after the procedure?  Unless your health care provider tells you otherwise, you may return to your normal schedule, including diet, activities, and medicines.  Unless your health care provider tells you otherwise, you may  increase your fluid intake. This will help flush the contrast dye from your body. Drink enough fluid to keep your urine clear or pale yellow.  It is up to you to get your test results. Ask your health care provider, or the department that is doing the test, when your results will be ready. Summary  A cardiac nuclear scan measures the blood flow to the heart when a person is resting and when he or she is exercising.  You may need this test if you are at risk for heart disease.  Tell your health care provider if you are pregnant.  Unless your health care provider tells you otherwise, increase your fluid intake. This will help flush the contrast dye from your body. Drink enough fluid to keep your urine clear or pale yellow. This information is not intended to replace advice given to you by your health care provider. Make sure you discuss any questions you have with your health care provider. Document Released: 04/25/2004 Document Revised: 04/02/2016 Document Reviewed: 03/09/2013 Elsevier Interactive Patient Education  2017 [employer].

## 2016-07-02 NOTE — Progress Notes (Signed)
New Outpatient Visit Date: 07/02/2016  Referring Provider: Coral Spikes, DO [address] [place], [place] [address]  Chief Complaint: Preoperative cardiovascular evaluation and management of atrial fibrillation.  HPI:  [name] is a 82 y.o. year-old male with history of atrial fibrillation, type 2 diabetes mellitus, hyperlipidemia, COPD, esophageal cancer, renal mass suspicious for malignancy, and dementia, who has been referred by Dr. Lacinda [name] for preoperative assessment in anticipation of renal mass cryoablation, as well as management of atrial fibrillation. The patient is accompanied by his wife and daughter, who provide much of the history.  [name] was hospitalized with altered mental status last month at [facility]. During his stay, he was noted to be atrial fibrillation. He also had a significant pleural effusion requiring left thoracentesis. He was discharged on aspirin, metoprolol, and furosemide.  Today, the patient reports feeling well. He has chronic exertional dyspnea when walking more than 50 feet, which has been present for several months. He denies chest pain, palpitations, orthopnea, and leg edema. He endorses lightheadedness when he "overdoes it," often in the setting of his exertional dyspnea. He was previously evaluated by a cardiologist in [place] and believes that he underwent a stress test 6-8 years ago. He was told of an irregular heart beat in the past and believes that he may have been given the diagnosis of a-fib while still living in [place]. He has never been on anticoagulation other than aspirin. He denies bleeding but has had several falls in the past. Though he does not remember falling, his family suspects that he fell while confused last month, shortly before coming the ED.  --------------------------------------------------------------------------------------------------  Cardiovascular History & Procedures: Cardiovascular Problems:  Persistent  atrial fibrillation  Heart failure with preserved ejection fraction  Risk Factors:  Diabetes mellitus, hyperlipidemia, age > 31, and male gender  Cath/PCI:  None  CV Surgery:  None  EP Procedures and Devices:  None  Non-Invasive Evaluation(s):  TTE (06/03/16): Normal LV size with low normal contraction; LVEF 50-55%. Normal RV size and function. Mild left atrial enlargement. No significant valvular abnormalities.  Recent CV Pertinent Labs: Lab Results  Component Value Date   CHOL 203 (H) 03/18/2016   HDL 48.40 03/18/2016   LDLDIRECT 108.0 03/18/2016   TRIG 204.0 (H) 03/18/2016   CHOLHDL 4 03/18/2016   INR 1.24 06/03/2016   K 4.1 06/06/2016   BUN 25 (H) 06/06/2016   CREATININE 1.62 (H) 06/06/2016    --------------------------------------------------------------------------------------------------  Past Medical History:  Diagnosis Date  . Alcoholism ([place])   . Anxiety   . COPD (chronic obstructive pulmonary disease) ([place])   . Dementia 03/18/2016  . DM type 2 with diabetic peripheral neuropathy ([place]) 03/18/2016  . Esophageal cancer ([place])   . GERD (gastroesophageal reflux disease) 03/18/2016  . Hyperlipidemia   . Macular degeneration   . Renal mass     Past Surgical History:  Procedure Laterality Date  . BACK SURGERY    . CATARACT EXTRACTION    . KNEE SURGERY      Outpatient Encounter Prescriptions as of 07/02/2016  Medication Sig  . ADVAIR DISKUS 250-50 MCG/DOSE AEPB Inhale 1 puff into the lungs daily.  Marland Kitchen ALPRAZolam (XANAX) 0.25 MG tablet 1 tablet at night x 1 week then 0.5 tablet x 1 week then discontinue.  Marland Kitchen aspirin 81 MG chewable tablet Chew 1 tablet (81 mg total) by mouth daily.  Marland Kitchen atorvastatin (LIPITOR) 40 MG tablet Take 1 tablet (40 mg total) by mouth daily.  Marland Kitchen  cetirizine (ZYRTEC) 10 MG tablet Take 10 mg by mouth daily.   . clotrimazole (CLOTRIMAZOLE AF) 1 % cream Apply 1 application topically 2 (two) times daily.  Marland Kitchen donepezil (ARICEPT) 10 MG tablet  Take 1 tablet (10 mg total) by mouth at bedtime.  . furosemide (LASIX) 20 MG tablet Take 1 tablet (20 mg total) by mouth daily.  Marland Kitchen gabapentin (NEURONTIN) 300 MG capsule Take 1 capsule (300 mg total) by mouth 2 (two) times daily.  Marland Kitchen glucose blood test strip Use as instructed  . ketoconazole (NIZORAL) 2 % cream Apply 1 application topically daily.  . metoprolol tartrate (LOPRESSOR) 25 MG tablet Take 0.5 tablets (12.5 mg total) by mouth 2 (two) times daily.  . Multiple Vitamins-Minerals (CENTRUM SILVER PO) Take 1 tablet by mouth daily.  Marland Kitchen omeprazole (PRILOSEC) 20 MG capsule Take 1 capsule (20 mg total) by mouth 2 (two) times daily before a meal.  . polyethylene glycol (MIRALAX / GLYCOLAX) packet Take 17 g by mouth daily.  . QUEtiapine (SEROQUEL) 50 MG tablet Take 1 tablet (50 mg total) by mouth at bedtime.  . STOOL SOFTENER 100 MG capsule Take 100 mg by mouth 2 (two) times daily.    No facility-administered encounter medications on file as of 07/02/2016.     Allergies: Patient has no known allergies.  Social History   Social History  . Marital status: Married    Spouse name: Miguel [name]  . Number of children: Miguel [name]  . Years of education: Miguel [name]   Occupational History  . Not on file.   Social History Main Topics  . Smoking status: Former Research scientist (life sciences)  . Smokeless tobacco: Never Used  . Alcohol use No  . Drug use: No  . Sexual activity: Not Currently    Partners: Female   Other Topics Concern  . Not on file   Social History Narrative  . No narrative on file    Family History  Problem Relation Age of Onset  . Ovarian cancer Sister   . Throat cancer Brother   . Diabetes Mother   . Kidney disease Father   . Prostate cancer Neg Hx   . Kidney cancer Neg Hx     Review of Systems: A 12-system review of systems was performed and was negative except as noted in the HPI.  --------------------------------------------------------------------------------------------------  Physical Exam: BP  116/62 (BP Location: Left Arm, Patient Position: Sitting, Cuff Size: Normal)   Pulse 78   Ht 5\' 11"  (1.803 m)   Wt 254 lb 12 oz (115.6 kg)   BMI 35.53 kg/m   General:  Obese, elderly man, seated comfortably in the exam room. He is accompanied by his wife and son. HEENT: No conjunctival pallor or scleral icterus.  Moist mucous membranes.  OP clear. Neck: Supple without lymphadenopathy, thyromegaly, JVD, or HJR.  No carotid bruit. Lungs: Normal work of breathing.  Mildly diminished breath sounds at the base (more pronounced on the left). No wheezes or crackles. Heart: Irregularly irregular without murmurs or rubs. Unable to assess PMI due to body habitus. Abd: Bowel sounds present.  Soft, NT/ND without hepatosplenomegaly Ext: Trace pretibial edema bilaterally.  Radial, PT, and DP pulses are 2+ bilaterally. Skin: warm and dry without rash Neuro: CNIII-XII intact.  Strength and fine-touch sensation intact in upper and lower extremities bilaterally. Psych: Normal mood and affect.  EKG:  Atrial fibrillation with low voltage and isolated PVC versus aberrancy.  Lab Results  Component Value Date   WBC 4.9 06/06/2016   HGB 12.9 (  L) 06/06/2016   HCT 38.9 (L) 06/06/2016   MCV 96.7 06/06/2016   PLT 139 (L) 06/06/2016    Lab Results  Component Value Date   NA 141 06/06/2016   K 4.1 06/06/2016   CL 103 06/06/2016   CO2 34 (H) 06/06/2016   BUN 25 (H) 06/06/2016   CREATININE 1.62 (H) 06/06/2016   GLUCOSE 112 (H) 06/06/2016   ALT 18 06/03/2016    Lab Results  Component Value Date   CHOL 203 (H) 03/18/2016   HDL 48.40 03/18/2016   LDLDIRECT 108.0 03/18/2016   TRIG 204.0 (H) 03/18/2016   CHOLHDL 4 03/18/2016     --------------------------------------------------------------------------------------------------  ASSESSMENT AND PLAN: Persistent atrial fibrillation Patient remain in a-fib today with adequate rate control. Based on the history provided by Mr. Stephanie and his family, it  is possible that he has had paroxysmal atrial fibrillation for several years. As he is asymptomatic with the exception of DOE (which is likely multifactorial), we have agreed to continue a rate-control strategy with metoprolol. Though he would benefit from anticoagulation with warfarin/NOAC, given his CHADSVASC of at least 3, his history of falls is concerning. We discussed the risks and benefits of ASA versus warfarin/NOAC and have agreed to continue with ASA.  Preoperative cardiovascular evaluation with dyspnea on exertion Mr. Yoho reports exertional dyspnea with minimal activity, such as walking 50 feet. This could be related to a number of factors including diastolic heart failure, atrial fibrillation, underlying coronary artery disease, or some other non-cardiac cause. Though percutaneous cryoablation of a renal mass is a relatively low-risk procedure, I feel that it would be prudent to exclude significant ischemia, given the patient's multiple cardiac risk factors and considerable DOE. We have therefore agreed to obtain a pharmacologic myocardial perfusion stress test (hopefully before 4/11, when is cryoablation is scheduled). As long as no high-risk features are identified, I think it would be reasonable to proceed with the cryoablation. If the stress test is high risk, we will need to consider coronary angiography first.  HFpEF Patient was recently hospitalized with altered mental status nd was found to have signs and symptoms of heart failure. Echo showed low-normal LVEF. He appears euvolemic today with NYHA class III symptoms. We will continue his current medications, including metoprolol and furosemide.  Follow-up: Return to clinic in 3 months.  Nelva Bush, MD 07/02/2016 9:57 AM

## 2016-07-03 ENCOUNTER — Encounter: Payer: Self-pay | Admitting: Internal Medicine

## 2016-07-03 ENCOUNTER — Telehealth: Payer: Self-pay

## 2016-07-03 ENCOUNTER — Telehealth: Payer: Self-pay | Admitting: Internal Medicine

## 2016-07-03 DIAGNOSIS — I5030 Unspecified diastolic (congestive) heart failure: Secondary | ICD-10-CM | POA: Insufficient documentation

## 2016-07-03 DIAGNOSIS — R0609 Other forms of dyspnea: Secondary | ICD-10-CM

## 2016-07-03 NOTE — Telephone Encounter (Signed)
S/w with patient's son, ok per DPR. Rescheduled Lexiscan myoview to 07/07/16 at 0800, arrive at 0745am. He verbalized understanding of date and time change and to follow the instructions as given at office visit on AVS.

## 2016-07-03 NOTE — Telephone Encounter (Signed)
Patient's son notified that surgery is scheduled for patient to have Right Ureteral Stent placed on 07-21-16 with Dr. Hollice Espy prior to his Renal Cyro on 07-23-16. Patient will need to drop off a pre-op urine culture at our office on 07-14-16 and pre-admit will call patient for a phone interview on 07-14-16 between 9-1pm. Patient's son verbalized agreement with this plan.

## 2016-07-03 NOTE — Telephone Encounter (Signed)
Pt son calling asking if we can move up patient Myoview to this Monday the 07/07/16 His brother is coming into town and is wanting to help patient out with appointments Would like to know if this is possible  Please advise.

## 2016-07-07 ENCOUNTER — Other Ambulatory Visit
Admission: RE | Admit: 2016-07-07 | Discharge: 2016-07-07 | Disposition: A | Discharge status: Home / Self Care | Payer: Medicare Other | Source: Ambulatory Visit | Attending: Interventional Radiology | Admitting: Interventional Radiology

## 2016-07-07 ENCOUNTER — Encounter
Admission: RE | Admit: 2016-07-07 | Discharge: 2016-07-07 | Disposition: A | Discharge status: Home / Self Care | Payer: Medicare Other | Source: Ambulatory Visit | Attending: Internal Medicine | Admitting: Internal Medicine

## 2016-07-07 DIAGNOSIS — Z01810 Encounter for preprocedural cardiovascular examination: Secondary | ICD-10-CM | POA: Diagnosis present

## 2016-07-07 DIAGNOSIS — R0609 Other forms of dyspnea: Secondary | ICD-10-CM | POA: Diagnosis present

## 2016-07-07 DIAGNOSIS — Z01812 Encounter for preprocedural laboratory examination: Secondary | ICD-10-CM | POA: Diagnosis present

## 2016-07-07 LAB — NM MYOCAR MULTI W/SPECT W/WALL MOTION / EF
CSEPHR: 73 %
LVDIAVOL: 111 mL (ref 62–150)
LVSYSVOL: 47 mL
Peak HR: 100 {beats}/min
Rest HR: 90 {beats}/min
SDS: 0
SRS: 3
SSS: 2
TID: 0.81

## 2016-07-07 LAB — CREATININE, SERUM
Creatinine, Ser: 1.49 mg/dL — ABNORMAL HIGH (ref 0.61–1.24)
GFR calc Af Amer: 48 mL/min — ABNORMAL LOW (ref >60)
GFR calc non Af Amer: 41 mL/min — ABNORMAL LOW (ref >60)

## 2016-07-07 LAB — BUN: BUN: 24 mg/dL — ABNORMAL HIGH (ref 6–20)

## 2016-07-07 MED ORDER — TECHNETIUM TC 99M TETROFOSMIN IV KIT
12.8010 | PACK | Freq: Once | INTRAVENOUS | Status: AC | PRN
  Administered 2016-07-07: 12.801 via INTRAVENOUS

## 2016-07-07 MED ORDER — REGADENOSON 0.4 MG/5ML IV SOLN
0.4000 mg | Freq: Once | INTRAVENOUS | Status: AC
  Administered 2016-07-07: 0.4 mg via INTRAVENOUS

## 2016-07-07 MED ORDER — TECHNETIUM TC 99M TETROFOSMIN IV KIT
31.8690 | PACK | Freq: Once | INTRAVENOUS | Status: AC | PRN
  Administered 2016-07-07: 31.869 via INTRAVENOUS

## 2016-07-09 ENCOUNTER — Ambulatory Visit
Admission: RE | Admit: 2016-07-09 | Discharge: 2016-07-09 | Disposition: A | Discharge status: Home / Self Care | Payer: Medicare Other | Source: Ambulatory Visit | Attending: Interventional Radiology | Admitting: Interventional Radiology

## 2016-07-09 DIAGNOSIS — I517 Cardiomegaly: Secondary | ICD-10-CM | POA: Diagnosis not present

## 2016-07-09 DIAGNOSIS — K8080 Other cholelithiasis without obstruction: Secondary | ICD-10-CM | POA: Insufficient documentation

## 2016-07-09 DIAGNOSIS — M2578 Osteophyte, vertebrae: Secondary | ICD-10-CM | POA: Diagnosis not present

## 2016-07-09 DIAGNOSIS — N281 Cyst of kidney, acquired: Secondary | ICD-10-CM | POA: Diagnosis not present

## 2016-07-09 DIAGNOSIS — N2889 Other specified disorders of kidney and ureter: Secondary | ICD-10-CM

## 2016-07-09 DIAGNOSIS — I700 Atherosclerosis of aorta: Secondary | ICD-10-CM | POA: Insufficient documentation

## 2016-07-09 MED ORDER — GADOBENATE DIMEGLUMINE 529 MG/ML IV SOLN
10.0000 mL | Freq: Once | INTRAVENOUS | Status: AC | PRN
  Administered 2016-07-09: 10 mL via INTRAVENOUS

## 2016-07-10 ENCOUNTER — Other Ambulatory Visit: Payer: Medicare Other

## 2016-07-14 ENCOUNTER — Other Ambulatory Visit: Payer: Self-pay | Admitting: Radiology

## 2016-07-14 ENCOUNTER — Inpatient Hospital Stay: Admission: RE | Admit: 2016-07-14 | Payer: Medicare Other | Source: Ambulatory Visit

## 2016-07-14 ENCOUNTER — Other Ambulatory Visit: Payer: Self-pay

## 2016-07-14 ENCOUNTER — Other Ambulatory Visit: Payer: Medicare Other

## 2016-07-14 DIAGNOSIS — N2889 Other specified disorders of kidney and ureter: Secondary | ICD-10-CM

## 2016-07-14 DIAGNOSIS — Z01818 Encounter for other preprocedural examination: Secondary | ICD-10-CM

## 2016-07-14 LAB — URINALYSIS, COMPLETE
Bilirubin, UA: NEGATIVE
Glucose, UA: NEGATIVE
Ketones, UA: NEGATIVE
Nitrite, UA: NEGATIVE
Protein, UA: NEGATIVE
RBC, UA: NEGATIVE
Specific Gravity, UA: <1.005 — ABNORMAL LOW (ref 1.005–1.030)
Urobilinogen, Ur: 0.2 mg/dL (ref 0.2–1.0)
pH, UA: 5 (ref 5.0–7.5)

## 2016-07-14 LAB — MICROSCOPIC EXAMINATION
Bacteria, UA: NONE SEEN
Epithelial Cells (non renal): NONE SEEN /hpf (ref 0–10)
RBC MICROSCOPIC, UA: NONE SEEN /HPF (ref 0–?)

## 2016-07-15 ENCOUNTER — Encounter
Admission: RE | Admit: 2016-07-15 | Discharge: 2016-07-15 | Disposition: A | Discharge status: Home / Self Care | Payer: Medicare Other | Source: Ambulatory Visit | Attending: Urology | Admitting: Urology

## 2016-07-15 DIAGNOSIS — Z01812 Encounter for preprocedural laboratory examination: Secondary | ICD-10-CM | POA: Insufficient documentation

## 2016-07-15 HISTORY — DX: Dyspnea, unspecified: R06.00

## 2016-07-15 HISTORY — DX: Esophageal obstruction: K22.2

## 2016-07-15 HISTORY — DX: Polyneuropathy, unspecified: G62.9

## 2016-07-15 NOTE — Patient Instructions (Signed)
Your procedure is scheduled on: 07/21/16 Mon Report to Same Day Surgery 2nd floor medical mall [facility] Entrance-take elevator on left to 2nd floor.  Check in with surgery information desk.) To find out your arrival time please call [phone] between 1PM - 3PM on 07/18/16 Fri  Remember: Instructions that are not followed completely may result in serious medical risk, up to and including death, or upon the discretion of your surgeon and anesthesiologist your surgery may need to be rescheduled.    _x___ 1. Do not eat food or drink liquids after midnight. No gum chewing or                              hard candies.     __x__ 2. No Alcohol for 24 hours before or after surgery.   __x__3. No Smoking for 24 prior to surgery.   ____  4. Bring all medications with you on the day of surgery if instructed.    __x__ 5. Notify your doctor if there is any change in your medical condition     (cold, fever, infections).     Do not wear jewelry, make-up, hairpins, clips or nail polish.  Do not wear lotions, powders, or perfumes. You may wear deodorant.  Do not shave 48 hours prior to surgery. Men may shave face and neck.  Do not bring valuables to the hospital.    [facility] is not responsible for any belongings or valuables.               Contacts, dentures or bridgework may not be worn into surgery.  Leave your suitcase in the car. After surgery it may be brought to your room.  For patients admitted to the hospital, discharge time is determined by your                       treatment team.   Patients discharged the day of surgery will not be allowed to drive home.  You will need someone to drive you home and stay with you the night of your procedure.    Please read over the following fact sheets that you were given:   [facility] Preparing for Surgery and or MRSA Information   _x___ Take anti-hypertensive (unless it includes a diuretic), cardiac, seizure, asthma,     anti-reflux and  psychiatric medicines. These include:  1. atorvastatin (LIPITOR  2.gabapentin (NEURONTIN)  3.metoprolol tartrate (LOPRESSOR  4.omeprazole (PRILOSEC)   5.  6.  ____Fleets enema or Magnesium Citrate as directed.   _x___ Use CHG Soap or sage wipes as directed on instruction sheet   ____ Use inhalers on the day of surgery and bring to hospital day of surgery  _x___ Stop Metformin and Janumet 2 days prior to surgery.    ____ Take 1/2 of usual insulin dose the night before surgery and none on the morning     surgery.   _x___ Follow recommendations from Cardiologist, Pulmonologist or PCP regarding          stopping Aspirin, Coumadin, Pllavix ,Eliquis, Effient, or Pradaxa, and Pletal.  X____Stop Anti-inflammatories such as Advil, Aleve, Ibuprofen, Motrin, Naproxen, Naprosyn, Goodies powders or aspirin products. OK to take Tylenol and                          Celebrex.   _x___ Stop supplements until after surgery.  But  may continue Vitamin D, Vitamin B,       and multivitamin.   ____ Bring C-Pap to the hospital.

## 2016-07-16 NOTE — Patient Instructions (Addendum)
Miguel [name].  07/16/2016   Your procedure is scheduled on: 07-23-16  Report to [facility] Main  Entrance .  Check in at [place] then take [place] to the third floor to short Stay.    Call this number if you have problems the morning of surgery [phone]   Remember: ONLY 1 PERSON MAY GO WITH YOU TO SHORT STAY TO GET  READY MORNING OF YOUR SURGERY.  Do not eat food or drink liquids :After Midnight.     Take these medicines the morning of surgery with A SIP OF WATER: , , Metoprolol (Lopressor), Omeprazole  (Prilosec). You may also bring and use your inhaler, antibiotic,   DO NOT TAKE ANY DIABETIC MEDICATIONS DAY OF YOUR SURGERY                               You may not have any metal on your body including hair pins and              piercings  Do not wear jewelry, make-up, lotions, powders or perfumes, deodorant                          Men may shave face and neck.   Do not bring valuables to the hospital. [place].  Contacts, dentures or bridgework may not be worn into surgery.  Leave suitcase in the car. After surgery it may be brought to your room.     Patients discharged the day of surgery will not be allowed to drive home.  Name and phone number of your driver:  Special Instructions: N/A              Please read over the following fact sheets you were given: _____________________________________________________________________             [facility] - Preparing for Surgery Before surgery, you can play an important role.  Because skin is not sterile, your skin needs to be as free of germs as possible.  You can reduce the number of germs on your skin by washing with CHG (chlorahexidine gluconate) soap before surgery.  CHG is an antiseptic cleaner which kills germs and bonds with the skin to continue killing germs even after washing. Please DO NOT use if you have an allergy to CHG  or antibacterial soaps.  If your skin becomes reddened/irritated stop using the CHG and inform your nurse when you arrive at Short Stay. Do not shave (including legs and underarms) for at least 48 hours prior to the first CHG shower.  You may shave your face/neck. Please follow these instructions carefully:  1.  Shower with CHG Soap the night before surgery and the  morning of Surgery.  2.  If you choose to wash your hair, wash your hair first as usual with your  normal  shampoo.  3.  After you shampoo, rinse your hair and body thoroughly to remove the  shampoo.                           4.  Use CHG as you would any other liquid soap.  You can apply chg directly  to the skin and wash                       Gently with a scrungie or clean washcloth.  5.  Apply the CHG Soap to your body ONLY FROM THE NECK DOWN.   Do not use on face/ open                           Wound or open sores. Avoid contact with eyes, ears mouth and genitals (private parts).                       Wash face,  Genitals (private parts) with your normal soap.             6.  Wash thoroughly, paying special attention to the area where your surgery  will be performed.  7.  Thoroughly rinse your body with warm water from the neck down.  8.  DO NOT shower/wash with your normal soap after using and rinsing off  the CHG Soap.                9.  Pat yourself dry with a clean towel.            10.  Wear clean pajamas.            11.  Place clean sheets on your bed the night of your first shower and do not  sleep with pets. Day of Surgery : Do not apply any lotions/deodorants the morning of surgery.  Please wear clean clothes to the hospital/surgery center.  FAILURE TO FOLLOW THESE INSTRUCTIONS MAY RESULT IN THE CANCELLATION OF YOUR SURGERY PATIENT SIGNATURE_________________________________  NURSE SIGNATURE__________________________________  ________________________________________________________________________

## 2016-07-16 NOTE — Pre-Procedure Instructions (Signed)
ABNORMAL LABS CALLED AND FAXED TIO ANGIE AT DR Cherrie Gauze

## 2016-07-17 LAB — CULTURE, URINE COMPREHENSIVE

## 2016-07-18 ENCOUNTER — Other Ambulatory Visit: Payer: Self-pay | Admitting: General Surgery

## 2016-07-18 ENCOUNTER — Encounter (HOSPITAL_COMMUNITY)
Admission: RE | Admit: 2016-07-18 | Discharge: 2016-07-18 | Disposition: A | Discharge status: Home / Self Care | Payer: Medicare Other | Source: Ambulatory Visit | Attending: Interventional Radiology | Admitting: Interventional Radiology

## 2016-07-18 ENCOUNTER — Encounter (HOSPITAL_COMMUNITY): Payer: Self-pay

## 2016-07-18 ENCOUNTER — Ambulatory Visit (HOSPITAL_COMMUNITY)
Admission: RE | Admit: 2016-07-18 | Discharge: 2016-07-18 | Disposition: A | Discharge status: Home / Self Care | Payer: Medicare Other | Source: Ambulatory Visit | Attending: Anesthesiology | Admitting: Anesthesiology

## 2016-07-18 ENCOUNTER — Other Ambulatory Visit: Payer: Self-pay | Admitting: Radiology

## 2016-07-18 DIAGNOSIS — J449 Chronic obstructive pulmonary disease, unspecified: Secondary | ICD-10-CM | POA: Insufficient documentation

## 2016-07-18 DIAGNOSIS — E1142 Type 2 diabetes mellitus with diabetic polyneuropathy: Secondary | ICD-10-CM | POA: Insufficient documentation

## 2016-07-18 DIAGNOSIS — I4891 Unspecified atrial fibrillation: Secondary | ICD-10-CM | POA: Diagnosis not present

## 2016-07-18 DIAGNOSIS — E785 Hyperlipidemia, unspecified: Secondary | ICD-10-CM | POA: Diagnosis not present

## 2016-07-18 DIAGNOSIS — F419 Anxiety disorder, unspecified: Secondary | ICD-10-CM | POA: Insufficient documentation

## 2016-07-18 DIAGNOSIS — N390 Urinary tract infection, site not specified: Secondary | ICD-10-CM

## 2016-07-18 DIAGNOSIS — Z01812 Encounter for preprocedural laboratory examination: Secondary | ICD-10-CM | POA: Diagnosis not present

## 2016-07-18 DIAGNOSIS — K219 Gastro-esophageal reflux disease without esophagitis: Secondary | ICD-10-CM | POA: Diagnosis not present

## 2016-07-18 DIAGNOSIS — Z01818 Encounter for other preprocedural examination: Secondary | ICD-10-CM | POA: Diagnosis present

## 2016-07-18 DIAGNOSIS — C159 Malignant neoplasm of esophagus, unspecified: Secondary | ICD-10-CM | POA: Insufficient documentation

## 2016-07-18 LAB — BASIC METABOLIC PANEL
Anion gap: 8 (ref 5–15)
BUN: 23 mg/dL — AB (ref 6–20)
CHLORIDE: 100 mmol/L — AB (ref 101–111)
CO2: 31 mmol/L (ref 22–32)
CREATININE: 1.62 mg/dL — AB (ref 0.61–1.24)
Calcium: 9.1 mg/dL (ref 8.9–10.3)
GFR calc Af Amer: 43 mL/min — ABNORMAL LOW (ref >60)
GFR, EST NON AFRICAN AMERICAN: 37 mL/min — AB (ref >60)
GLUCOSE: 106 mg/dL — AB (ref 65–99)
Potassium: 5.1 mmol/L (ref 3.5–5.1)
SODIUM: 139 mmol/L (ref 135–145)

## 2016-07-18 LAB — CBC WITH DIFFERENTIAL/PLATELET
BASOS ABS: 0 10*3/uL (ref 0.0–0.1)
BASOS PCT: 0 %
Eosinophils Absolute: 0.2 10*3/uL (ref 0.0–0.7)
Eosinophils Relative: 4 %
HEMATOCRIT: 43.1 % (ref 39.0–52.0)
HEMOGLOBIN: 13.9 g/dL (ref 13.0–17.0)
Lymphocytes Relative: 19 %
Lymphs Abs: 1 10*3/uL (ref 0.7–4.0)
MCH: 31.5 pg (ref 26.0–34.0)
MCHC: 32.3 g/dL (ref 30.0–36.0)
MCV: 97.7 fL (ref 78.0–100.0)
MONOS PCT: 6 %
Monocytes Absolute: 0.3 10*3/uL (ref 0.1–1.0)
NEUTROS ABS: 3.8 10*3/uL (ref 1.7–7.7)
NEUTROS PCT: 71 %
Platelets: 139 10*3/uL — ABNORMAL LOW (ref 150–400)
RBC: 4.41 MIL/uL (ref 4.22–5.81)
RDW: 15.7 % — ABNORMAL HIGH (ref 11.5–15.5)
WBC: 5.3 10*3/uL (ref 4.0–10.5)

## 2016-07-18 LAB — GLUCOSE, CAPILLARY: GLUCOSE-CAPILLARY: 101 mg/dL — AB (ref 65–99)

## 2016-07-18 LAB — APTT: APTT: 32 s (ref 24–36)

## 2016-07-18 LAB — PROTIME-INR
INR: 1.07
PROTHROMBIN TIME: 13.9 s (ref 11.4–15.2)

## 2016-07-18 LAB — ABO/RH: ABO/RH(D): O POS

## 2016-07-18 MED ORDER — CEPHALEXIN 500 MG PO CAPS: 500.0000 mg | ORAL_CAPSULE | Freq: Three times a day (TID) | ORAL | 0 refills | Status: DC

## 2016-07-18 NOTE — Progress Notes (Signed)
BMp done 07/18/16 faxed via epic to Dr Kathlene Cote.

## 2016-07-18 NOTE — Progress Notes (Addendum)
CXR results of 07/18/16 faxed via epic to Dr Kathlene Cote.  CXR results of 07/18/2016 shown to Dr Smith Robert ( anesthesia).   Per Dr Smith Robert patient will be evaluated by anesthesia  am of surgery.   Saverio Danker [facility] with Radiology made aware of CXR results from 07/18/16 and will inform Dr Kathlene Cote of CXR result.

## 2016-07-18 NOTE — Telephone Encounter (Signed)
Notified pt's son of +ucx & script sent to pharmacy. Questions answered. Son voices understanding.

## 2016-07-18 NOTE — Progress Notes (Addendum)
EKG-07/02/16-epic  06/03/16-ECHO-epic  07/07/16- Stress Test- epic  LOV- 07/02/16- Cardiology- epic

## 2016-07-20 MED ORDER — CEFAZOLIN IN D5W 1 GM/50ML IV SOLN
1.0000 g | INTRAVENOUS | Status: AC
  Administered 2016-07-21: 1 g via INTRAVENOUS

## 2016-07-21 ENCOUNTER — Ambulatory Visit: Payer: Medicare Other | Admitting: Certified Registered"

## 2016-07-21 ENCOUNTER — Encounter: Payer: Self-pay | Admitting: *Deleted

## 2016-07-21 ENCOUNTER — Other Ambulatory Visit (HOSPITAL_COMMUNITY): Payer: Medicare Other

## 2016-07-21 ENCOUNTER — Ambulatory Visit
Admission: RE | Admit: 2016-07-21 | Discharge: 2016-07-21 | Disposition: A | Discharge status: Home / Self Care | Payer: Medicare Other | Source: Ambulatory Visit | Attending: Urology | Admitting: Urology

## 2016-07-21 ENCOUNTER — Encounter
Admission: RE | Disposition: A | Discharge status: Home / Self Care | Payer: Self-pay | Source: Ambulatory Visit | Attending: Urology

## 2016-07-21 DIAGNOSIS — F0390 Unspecified dementia without behavioral disturbance: Secondary | ICD-10-CM | POA: Diagnosis not present

## 2016-07-21 DIAGNOSIS — Z7984 Long term (current) use of oral hypoglycemic drugs: Secondary | ICD-10-CM | POA: Diagnosis not present

## 2016-07-21 DIAGNOSIS — K219 Gastro-esophageal reflux disease without esophagitis: Secondary | ICD-10-CM | POA: Diagnosis not present

## 2016-07-21 DIAGNOSIS — R350 Frequency of micturition: Secondary | ICD-10-CM | POA: Insufficient documentation

## 2016-07-21 DIAGNOSIS — E1142 Type 2 diabetes mellitus with diabetic polyneuropathy: Secondary | ICD-10-CM | POA: Diagnosis not present

## 2016-07-21 DIAGNOSIS — E785 Hyperlipidemia, unspecified: Secondary | ICD-10-CM | POA: Insufficient documentation

## 2016-07-21 DIAGNOSIS — I129 Hypertensive chronic kidney disease with stage 1 through stage 4 chronic kidney disease, or unspecified chronic kidney disease: Secondary | ICD-10-CM | POA: Insufficient documentation

## 2016-07-21 DIAGNOSIS — Z8501 Personal history of malignant neoplasm of esophagus: Secondary | ICD-10-CM | POA: Diagnosis not present

## 2016-07-21 DIAGNOSIS — Z87891 Personal history of nicotine dependence: Secondary | ICD-10-CM | POA: Diagnosis not present

## 2016-07-21 DIAGNOSIS — Z96659 Presence of unspecified artificial knee joint: Secondary | ICD-10-CM | POA: Insufficient documentation

## 2016-07-21 DIAGNOSIS — Z9221 Personal history of antineoplastic chemotherapy: Secondary | ICD-10-CM | POA: Insufficient documentation

## 2016-07-21 DIAGNOSIS — J449 Chronic obstructive pulmonary disease, unspecified: Secondary | ICD-10-CM | POA: Diagnosis not present

## 2016-07-21 DIAGNOSIS — F419 Anxiety disorder, unspecified: Secondary | ICD-10-CM | POA: Diagnosis not present

## 2016-07-21 DIAGNOSIS — Z79899 Other long term (current) drug therapy: Secondary | ICD-10-CM | POA: Insufficient documentation

## 2016-07-21 DIAGNOSIS — E1122 Type 2 diabetes mellitus with diabetic chronic kidney disease: Secondary | ICD-10-CM | POA: Diagnosis not present

## 2016-07-21 DIAGNOSIS — F1021 Alcohol dependence, in remission: Secondary | ICD-10-CM | POA: Diagnosis not present

## 2016-07-21 DIAGNOSIS — Z7951 Long term (current) use of inhaled steroids: Secondary | ICD-10-CM | POA: Diagnosis not present

## 2016-07-21 DIAGNOSIS — N2889 Other specified disorders of kidney and ureter: Secondary | ICD-10-CM | POA: Diagnosis not present

## 2016-07-21 DIAGNOSIS — Z923 Personal history of irradiation: Secondary | ICD-10-CM | POA: Insufficient documentation

## 2016-07-21 DIAGNOSIS — N183 Chronic kidney disease, stage 3 (moderate): Secondary | ICD-10-CM | POA: Insufficient documentation

## 2016-07-21 HISTORY — PX: CYSTOSCOPY WITH STENT PLACEMENT: SHX5790

## 2016-07-21 HISTORY — PX: CYSTOSCOPY W/ RETROGRADES: SHX1426

## 2016-07-21 LAB — GLUCOSE, CAPILLARY
Glucose-Capillary: 83 mg/dL (ref 65–99)
Glucose-Capillary: 81 mg/dL (ref 65–99)

## 2016-07-21 SURGERY — CYSTOSCOPY, WITH STENT INSERTION
Anesthesia: General | Laterality: Right | Wound class: Clean Contaminated

## 2016-07-21 MED ORDER — ONDANSETRON HCL 4 MG/2ML IJ SOLN
INTRAMUSCULAR | Status: AC
  Filled 2016-07-21: qty 2

## 2016-07-21 MED ORDER — PHENYLEPHRINE HCL 10 MG/ML IJ SOLN
INTRAMUSCULAR | Status: DC | PRN
  Administered 2016-07-21 (×2): 100 ug via INTRAVENOUS

## 2016-07-21 MED ORDER — ONDANSETRON HCL 4 MG/2ML IJ SOLN: 4.0000 mg | Freq: Once | INTRAMUSCULAR | Status: DC | PRN

## 2016-07-21 MED ORDER — FENTANYL CITRATE (PF) 100 MCG/2ML IJ SOLN
INTRAMUSCULAR | Status: AC
  Filled 2016-07-21: qty 2

## 2016-07-21 MED ORDER — FENTANYL CITRATE (PF) 100 MCG/2ML IJ SOLN: 25.0000 ug | INTRAMUSCULAR | Status: DC | PRN

## 2016-07-21 MED ORDER — SODIUM CHLORIDE 0.9 % IV SOLN
INTRAVENOUS | Status: DC
  Administered 2016-07-21: 11:00:00 via INTRAVENOUS

## 2016-07-21 MED ORDER — FENTANYL CITRATE (PF) 100 MCG/2ML IJ SOLN
INTRAMUSCULAR | Status: DC | PRN
  Administered 2016-07-21: 100 ug via INTRAVENOUS

## 2016-07-21 MED ORDER — CEFAZOLIN IN D5W 1 GM/50ML IV SOLN
INTRAVENOUS | Status: AC
  Filled 2016-07-21: qty 50

## 2016-07-21 MED ORDER — PROPOFOL 10 MG/ML IV BOLUS
INTRAVENOUS | Status: DC | PRN
  Administered 2016-07-21: 150 mg via INTRAVENOUS

## 2016-07-21 MED ORDER — ONDANSETRON HCL 4 MG/2ML IJ SOLN
INTRAMUSCULAR | Status: DC | PRN
  Administered 2016-07-21: 4 mg via INTRAVENOUS

## 2016-07-21 MED ORDER — HYDROCODONE-ACETAMINOPHEN 5-325 MG PO TABS: 1.0000 | ORAL_TABLET | Freq: Four times a day (QID) | ORAL | 0 refills | Status: DC | PRN

## 2016-07-21 MED ORDER — SUCCINYLCHOLINE CHLORIDE 20 MG/ML IJ SOLN
INTRAMUSCULAR | Status: DC | PRN
  Administered 2016-07-21: 100 mg via INTRAVENOUS

## 2016-07-21 MED ORDER — TAMSULOSIN HCL 0.4 MG PO CAPS
0.4000 mg | ORAL_CAPSULE | Freq: Every day | ORAL | 0 refills | Status: DC
Start: 2016-07-21 — End: 2016-12-30

## 2016-07-21 MED ORDER — IOTHALAMATE MEGLUMINE 43 % IV SOLN
INTRAVENOUS | Status: DC | PRN
  Administered 2016-07-21: 15 mL

## 2016-07-21 MED ORDER — PROPOFOL 10 MG/ML IV BOLUS
INTRAVENOUS | Status: AC
  Filled 2016-07-21: qty 20

## 2016-07-21 SURGICAL SUPPLY — 20 items
BAG DRAIN CYSTO-URO LG1000N (MISCELLANEOUS) ×2 IMPLANT
CATH URETL 5X70 OPEN END (CATHETERS) ×2 IMPLANT
CONRAY 43 FOR UROLOGY 50M (MISCELLANEOUS) ×2 IMPLANT
DRAPE UTILITY 15X26 TOWEL STRL (DRAPES) ×2 IMPLANT
GLOVE BIO SURGEON STRL SZ 6.5 (GLOVE) ×2 IMPLANT
GOWN STRL REUS W/ TWL LRG LVL3 (GOWN DISPOSABLE) ×2 IMPLANT
GOWN STRL REUS W/TWL LRG LVL3 (GOWN DISPOSABLE) ×2
KIT RM TURNOVER CYSTO AR (KITS) ×2 IMPLANT
PACK CYSTO AR (MISCELLANEOUS) ×2 IMPLANT
SCRUB POVIDONE IODINE 4 OZ (MISCELLANEOUS) ×2 IMPLANT
SENSORWIRE 0.038 NOT ANGLED (WIRE) ×2
SET CYSTO W/LG BORE CLAMP LF (SET/KITS/TRAYS/PACK) ×2 IMPLANT
SOL .9 NS 3000ML IRR  AL (IV SOLUTION) ×1
SOL .9 NS 3000ML IRR UROMATIC (IV SOLUTION) ×1 IMPLANT
STENT URET 6FRX24 CONTOUR (STENTS) IMPLANT
STENT URET 6FRX26 CONTOUR (STENTS) ×2 IMPLANT
SURGILUBE 2OZ TUBE FLIPTOP (MISCELLANEOUS) ×2 IMPLANT
SYRINGE IRR TOOMEY STRL 70CC (SYRINGE) IMPLANT
WATER STERILE IRR 1000ML POUR (IV SOLUTION) ×2 IMPLANT
WIRE SENSOR 0.038 NOT ANGLED (WIRE) ×1 IMPLANT

## 2016-07-21 NOTE — Anesthesia Post-op Follow-up Note (Cosign Needed)
Anesthesia QCDR form completed.        

## 2016-07-21 NOTE — Anesthesia Postprocedure Evaluation (Signed)
Anesthesia Post Note  Patient: Miguel [name].  Procedure(s) Performed: Procedure(s) (LRB): CYSTOSCOPY WITH STENT PLACEMENT (Right) CYSTOSCOPY WITH RETROGRADE PYELOGRAM (Right)  Patient location during evaluation: PACU Anesthesia Type: General Level of consciousness: awake and alert Pain management: pain level controlled Vital Signs Assessment: post-procedure vital signs reviewed and stable Respiratory status: spontaneous breathing and respiratory function stable Cardiovascular status: stable Anesthetic complications: no     Last Vitals:  Vitals:   07/21/16 1116 07/21/16 1117  BP: 131/86 131/86  Pulse: 67 (!) 58  Resp:  15  Temp: 36.1 C     Last Pain:  Vitals:   07/21/16 1012  TempSrc: Tympanic                 [name]

## 2016-07-21 NOTE — Discharge Instructions (Signed)
You have a ureteral stent in place.  This is a tube that extends from your kidney to your bladder.  This may cause urinary bleeding, burning with urination, and urinary frequency.  Please call our office or present to the ED if you develop fevers >101 or pain which is not able to be controlled with oral pain medications.  You may be given either Flomax and/ or ditropan to help with bladder spasms and stent pain in addition to pain medications.    Your stent will be removed in about 2 weeks. It is important that you follow up for stent removal as scheduled.    [facility] [address], [place], [place] [address] [phone]

## 2016-07-21 NOTE — H&P (Signed)
05/29/2016  --> Patient seen and examined today on 07/21/16. Interval  admission for afib.  Asked by Dr. Kathlene Cote to place preop ureteral stent to help with ureteral identification at time of renal mass ablation.    RRR CTAB  Treated for +UCx, 50K beta hemolytic strep     Miguel [name] 1930/03/14 [id number]  Referring provider: Coral Spikes, DO [address] [place], [place] [address]  No chief complaint on file.   HPI: 83 year old male with a history of esophageal cancer who presents today for further evaluation of an incidental 3.2 cm heterogeneous  enhancing right lower pole renal mass.  This was seen on staging CT abdomen pelvis with contrast on 05/02/16.  He has no obvious signs of metastatic disease otherwise.  Compared to previous imaging performed in 01/2016, the renal mass has increased up to 5 mm in size over the short interval. These images were previously reviewed by the radiologist at tumor board.  In terms of his esophageal cancer, he has been diagnosed with stage IIIB.  This was diagnosed in October 2016 and was treated with concurrent chemotherapy and radiation.  He's had one recurrence and treated with additional taxol and carboplatinum.  He is currently NED.  He had knee replacement surgery several years ago and had a significant time recovering from anesthesia and a prolonged recovery.  He does have a history of urinary symptoms including presumed overactive bladder for which he takes Toviaz prescribed many years ago per her previous urologist. He reports that he tried multiple medications for overactive bladder and ultimately had some improvement with Toviaz. He does not think that this is likely helping him any more. He does have multiple medical comorbidities contributing to his urinary symptoms including diabetes, lower extremity edema, takes Lasix, difficulty ambulating, and possibly undiagnosed sleep apnea.\  He denies any flank pain,  weight loss, dysuria, or gross hematuria.  He is accompanied today by his daughter and wife.  PMH:     Past Medical History:  Diagnosis Date  . Alcoholism ([place])   . Anxiety   . COPD (chronic obstructive pulmonary disease) ([place])   . Dementia 03/18/2016  . DM type 2 with diabetic peripheral neuropathy ([place]) 03/18/2016  . Esophageal cancer ([place])   . GERD (gastroesophageal reflux disease) 03/18/2016  . Hyperlipidemia   . Macular degeneration     Surgical History:      Past Surgical History:  Procedure Laterality Date  . BACK SURGERY    . CATARACT EXTRACTION    . KNEE SURGERY      Home Medications:  Allergies as of 05/29/2016   No Known Allergies              Medication List           Accurate as of 05/29/16 11:59 PM. Always use your most recent med list.           ADVAIR DISKUS 250-50 MCG/DOSE Aepb Generic drug:  Fluticasone-Salmeterol Inhale 1 puff into the lungs daily.   ALPRAZolam 0.5 MG tablet Commonly known as:  XANAX Take 1 tablet by mouth as needed.   atorvastatin 40 MG tablet Commonly known as:  LIPITOR Take 1 tablet (40 mg total) by mouth daily.   cetirizine 10 MG tablet Commonly known as:  ZYRTEC Take 1 tablet by mouth daily.   clotrimazole 1 % cream Commonly known as:  CLOTRIMAZOLE AF Apply 1 application topically 2 (two) times daily.   donepezil 10 MG tablet  Commonly known as:  ARICEPT Take 1 tablet by mouth daily.   fesoterodine 8 MG Tb24 tablet Commonly known as:  TOVIAZ Take 1 tablet (8 mg total) by mouth daily.   furosemide 20 MG tablet Commonly known as:  LASIX Take 1 tablet (20 mg total) by mouth daily.   gabapentin 300 MG capsule Commonly known as:  NEURONTIN Take 1 capsule by mouth 2 (two) times daily.   glucose blood test strip Use as instructed   ketoconazole 2 % cream Commonly known as:  NIZORAL Apply 1 application topically daily.   metFORMIN 1000 MG tablet Commonly known as:   GLUCOPHAGE Take 1 tablet by mouth 2 (two) times daily.   omeprazole 20 MG capsule Commonly known as:  PRILOSEC Take 1 capsule (20 mg total) by mouth 2 (two) times daily before a meal.   STOOL SOFTENER 100 MG capsule Generic drug:  docusate sodium Take 1 tablet by mouth 2 (two) times daily.       Allergies: No Known Allergies  Family History:      Family History  Problem Relation Age of Onset  . Ovarian cancer Sister   . Throat cancer Brother   . Diabetes Mother   . Kidney disease Father   . Prostate cancer Neg Hx   . Kidney cancer Neg Hx     Social History:  reports that he has quit smoking. He has never used smokeless tobacco. He reports that he does not drink alcohol or use drugs.  ROS: UROLOGY Frequent Urination?: Yes Hard to postpone urination?: Yes Burning/pain with urination?: No Get up at night to urinate?: Yes Leakage of urine?: No Urine stream starts and stops?: No Trouble starting stream?: No Do you have to strain to urinate?: No Blood in urine?: No Urinary tract infection?: No Sexually transmitted disease?: No Injury to kidneys or bladder?: No Painful intercourse?: No Weak stream?: No Erection problems?: No Penile pain?: No  Gastrointestinal Nausea?: No Vomiting?: No Indigestion/heartburn?: Yes Diarrhea?: No Constipation?: No  Constitutional Fever: No Night sweats?: No Weight loss?: No Fatigue?: Yes  Skin Skin rash/lesions?: No Itching?: No  Eyes Blurred vision?: No Double vision?: No  Ears/Nose/Throat Sore throat?: Yes Sinus problems?: No  Hematologic/Lymphatic Swollen glands?: No Easy bruising?: Yes  Cardiovascular Leg swelling?: No Chest pain?: No  Respiratory Cough?: Yes Shortness of breath?: Yes  Endocrine Excessive thirst?: No  Musculoskeletal Back pain?: Yes Joint pain?: Yes  Neurological Headaches?: No Dizziness?: No  Psychologic Depression?: No Anxiety?: Yes  Physical  Exam: BP 105/69   Pulse 89   Ht 5\' 11"  (1.803 m)   Wt 240 lb (108.9 kg)   BMI 33.47 kg/m   Constitutional:  Alert and oriented, No acute distress.  Elderly. Accompanied by family members today. HEENT: [place] AT, moist mucus membranes.  Trachea midline, no masses. Cardiovascular: No clubbing, cyanosis.  Mild bilateral lower extremity edema. Respiratory: Normal respiratory effort, no increased work of breathing. GI: Abdomen is soft, nontender, nondistended, no abdominal masses.  Obese.   GU: No CVA tenderness.  Skin: No rashes, bruises or suspicious lesions. Neurologic: Grossly intact, no focal deficits, moving all 4 extremities. Psychiatric: Normal mood and affect.  Ambulating slowly, with cane.  Laboratory Data: RecentLabs       Lab Results  Component Value Date   WBC 5.1 05/12/2016   HGB 14.6 05/12/2016   HCT 43.3 05/12/2016   MCV 95.7 05/12/2016   PLT 158 05/12/2016      RecentLabs  Lab Results  Component Value Date   CREATININE 1.44 (H) 05/12/2016       RecentLabs       Lab Results  Component Value Date   HGBA1C 5.4 03/18/2016      Urinalysis Labs(Brief)  No results found for: COLORURINE, APPEARANCEUR, LABSPEC, PHURINE, GLUCOSEU, HGBUR, BILIRUBINUR, KETONESUR, PROTEINUR, UROBILINOGEN, NITRITE, LEUKOCYTESUR    Pertinent Imaging: CLINICAL DATA: Esophageal cancer.  EXAM: CT CHEST, ABDOMEN, AND PELVIS WITH CONTRAST  TECHNIQUE: Multidetector CT imaging of the chest, abdomen and pelvis was performed following the standard protocol during bolus administration of intravenous contrast.  CONTRAST: 40mL ISOVUE-300 IOPAMIDOL (ISOVUE-300) INJECTION 61%  COMPARISON: 02/11/2016.  FINDINGS: CT CHEST FINDINGS  Cardiovascular: Right IJ Port-A-Cath terminates in the right atrium. Atherosclerotic calcification of the arterial vasculature, including coronary arteries. Heart is at the upper limits of normal in size to mildly  enlarged. No pericardial effusion.  Mediastinum/Nodes: No pathologically enlarged mediastinal, hilar or axillary lymph nodes. Esophageal wall may be slightly thickened. The esophagus is dilated and air-filled proximally.  Lungs/Pleura: Mild centrilobular emphysema. Small area of subpleural airspace consolidation in the medial right upper lobe is grossly stable. Mild dependent atelectasis in the left lower lobe. Lungs are otherwise clear. Airway is unremarkable.  Musculoskeletal: Flowing anterior osteophytosis in the thoracic spine. No worrisome lytic or sclerotic lesions. Old right rib fractures.  CT ABDOMEN PELVIS FINDINGS  Hepatobiliary: Liver is unremarkable. Numerous stones are seen in the gallbladder. No biliary ductal dilatation.  Pancreas: Negative.  Spleen: Negative.  Adrenals/Urinary Tract: Adrenal glands are unremarkable. A heterogeneous 3.2 cm mass is seen in the lower pole right kidney. Additional low-attenuation lesions in the kidneys measure up to 3.8 cm and 23 Hounsfield units on the left, difficult to characterize as simple cysts. Ureters are decompressed. Presumed TURP defect. Bladder is otherwise grossly unremarkable.  Stomach/Bowel: Stomach, small bowel and colon are unremarkable. Appendix is not readily visualized.  Vascular/Lymphatic: Atherosclerotic calcification of the arterial vasculature without abdominal aortic aneurysm. No pathologically enlarged lymph nodes.  Reproductive: There appears to be a TURP defect in the bladder.  Other: Small bilateral inguinal hernias contain fat. No free fluid. Mesenteries and peritoneum are unremarkable.  Musculoskeletal: No worrisome lytic or sclerotic lesions. Degenerative changes are seen in the spine. Postoperative or posttraumatic changes in the right iliac wing.  IMPRESSION: 1. Mild esophageal wall thickening, as before. No evidence of metastatic disease. 2. Small area of subpleural  consolidation in the medial right upper lobe may be treatment related. 3. Heterogeneous right renal mass, highly worrisome for renal cell carcinoma. These results will be called to the ordering clinician or representative by the Radiologist Assistant, and communication documented in the PACS or zVision Dashboard. 4. Aortic atherosclerosis (ICD10-170.0). Coronary artery calcification. 5. Cholelithiasis.   Electronically Signed By: Lorin Picket M.D. On: 05/12/2016 14:00  Imaging was reviewed personally today and with the patient. This is partially compared to previous CT scan from 01/2016.  Assessment & Plan:    1. Right renal mass Enlarging, enhancing 3.1 cm right posterior renal mass highly suspicious for renal cell carcinoma, asymptomatic.  A solid renal mass raises the suspicion of primary renal malignancy.  We discussed this in detail and in regards to the spectrum of renal masses which includes cysts (pure cysts are considered benign), solid masses and everything in between. The risk of metastasis increases as the size of solid renal mass increases. In general, it is believed that the risk of metastasis for renal masses less than 3-4 cm is  small (up to approximately 5%) based mainly on large retrospective studies.  In some cases and especially in patients of older age and multiple comorbidities a surveillance approach may be appropriate. The treatment of solid renal masses includes: surveillance, cryoablation (percutaneous and laparoscopic) in addition to partial and complete nephrectomy (each with option of laparoscopic, robotic and open depending on appropriateness). Furthermore, nephrectomy appears to be an independent risk factor for the development of chronic kidney disease suggesting that nephron sparing approaches should be implored whenever feasible. We reviewed these options in context of the patients current situation as well as the pros and cons of each.  Given  his age and comorbidities, I do not feel that he is a good surgical candidate for the small renal mass. I would recommend either continued surveillance on a every 6 month interval versus consideration of ablative therapy. I'll refer him for an interventional radiology consult to discuss whether or not the mass is amenable to this.  We did review the risks and benefits of this procedure in detail today and all questions were answered.  He and his family are agreeable with this plan. We will plan a follow-up in 6 months or sooner as needed. If the does not elect to proceed with ablative therapy, will obtain imaging at that point time if not artery performed for the purpose of esophageal cancer surveillance.  2. Stage III CKD Baseline Cr ~1.4 Would favor nephron sparing approach should any intervetion be considered.  3. Urinary frequency Likely multifactorial as mentioned in history of present illness. Behavioral modifications as possible were reviewed. I have recommended given his history of dementia and side effects from the medication, he should consider holding his Toviaz in order to assess whether or not this medication is truly beneficial. Recommendations including voiding diary prior to and after stopping the medication were reviewed.   Return in about 6 months (around 11/26/2016).  Hollice Espy, MD  [facility] [address] [place], [place] [address] [phone]  I spent 45 min with this patient of which greater than 50% was spent in counseling and coordination of care with the patient.      Electronically signed by Hollice Espy, MD at 06/03/2016 8:46 AM

## 2016-07-21 NOTE — Progress Notes (Signed)
Voided x2   Urine somewhat bloody

## 2016-07-21 NOTE — Op Note (Signed)
Date of procedure: 07/21/16  Preoperative diagnosis:  1. Right renal mass   Postoperative diagnosis:  1. Right renal mass   Procedure: 1. Right retrograde pyelogram 2. Right ureteral stent placement  Surgeon: Hollice Espy, MD  Anesthesia: General  Complications: None  Intraoperative findings: Tortuosity of the mid and distal ureter without filling defects. No hydronephrosis. Stent placed without complication.  EBL: Minimal  Specimens: None  Drains: 6 x 26 French double-J ureteral stent  Indication: Miguel [name]. is a 83 y.o. patient with right renal mass scheduled undergo percutaneous ablation.  I have been asked by Dr. Kathlene Cote to place a preoperative stent to help facilitate identification of the ureter..  After reviewing the management options for treatment, he elected to proceed with the above surgical procedure(s). We have discussed the potential benefits and risks of the procedure, side effects of the proposed treatment, the likelihood of the patient achieving the goals of the procedure, and any potential problems that might occur during the procedure or recuperation. Informed consent has been obtained.  Description of procedure:  The patient was taken to the operating room and general anesthesia was induced.  The patient was placed in the dorsal lithotomy position, prepped and draped in the usual sterile fashion, and preoperative antibiotics were administered. A preoperative time-out was performed.   A 21 French scope was advanced per urethra into the bladder. The bladder was carefully inspected and there was no evidence of lesions, ulcerations, or stones. Trigone was normal anatomic position. Attention was turned to the right ureteral orifice which was cannulated using a 5 [place] open-ended ureteral catheter. A gentle retrograde pyelogram was performed which revealed some fairly significant tortuosity in the mid distal ureter but otherwise, no filling defects or  ureteral dilation. No evidence of hydronephrosis. A wire was then able to be placed up to level of the kidney under fluoroscopic guidance. A 6 x 26 French double-J ureteral stent was advanced over the wire up to level of the renal pelvis. The wire was partially drawn until full coil was noted to be looped over midpole calyx. A full coil was noted within the bladder on direct visualization. The bladder was then drained and the scope was removed. The patient was repositioned the supine position, reversed by anesthesia, taken to the PACU in stable condition.  Plan: Patient will follow-up in 2 weeks for cystoscopy, stent removal following his pretty tedious ablation.  Hollice Espy, M.D.

## 2016-07-21 NOTE — Anesthesia Procedure Notes (Signed)
Procedure Name: Intubation Performed by: Briauna Gilmartin Pre-anesthesia Checklist: Patient identified, Patient being monitored, Timeout performed, Emergency Drugs available and Suction available Patient Re-evaluated:Patient Re-evaluated prior to inductionOxygen Delivery Method: Circle system utilized Preoxygenation: Pre-oxygenation with 100% oxygen Intubation Type: IV induction Ventilation: Mask ventilation without difficulty Laryngoscope Size: Mac and 3 Grade View: Grade I Tube type: Oral Tube size: 7.5 mm Number of attempts: 1 Airway Equipment and Method: Stylet Placement Confirmation: ETT inserted through vocal cords under direct vision,  positive ETCO2 and breath sounds checked- equal and bilateral Secured at: 23 cm Tube secured with: Tape Dental Injury: Teeth and Oropharynx as per pre-operative assessment        

## 2016-07-21 NOTE — Anesthesia Preprocedure Evaluation (Signed)
Anesthesia Evaluation  Patient identified by MRN, date of birth, ID band Patient awake    Reviewed: Allergy & Precautions, NPO status , Patient's Chart, lab work & pertinent test results  History of Anesthesia Complications Negative for: history of anesthetic complications  Airway Mallampati: III       Dental  (+) Edentulous Upper, Edentulous Lower   Pulmonary COPD,  COPD inhaler, former smoker,           Cardiovascular hypertension, Pt. on medications and Pt. on home beta blockers + dysrhythmias Atrial Fibrillation + Valvular Problems/Murmurs (murmur)      Neuro/Psych Anxiety  Neuromuscular disease (peripheral neuropathy)    GI/Hepatic Neg liver ROS, GERD  Medicated and Poorly Controlled,  Endo/Other  diabetes, Type 2, Oral Hypoglycemic Agents  Renal/GU Renal InsufficiencyRenal disease     Musculoskeletal   Abdominal   Peds  Hematology   Anesthesia Other Findings   Reproductive/Obstetrics                             Anesthesia Physical Anesthesia Plan  ASA: III  Anesthesia Plan: General   Post-op Pain Management:    Induction: Intravenous  Airway Management Planned: Oral ETT  Additional Equipment:   Intra-op Plan:   Post-operative Plan:   Informed Consent: I have reviewed the patients History and Physical, chart, labs and discussed the procedure including the risks, benefits and alternatives for the proposed anesthesia with the patient or authorized representative who has indicated his/her understanding and acceptance.     Plan Discussed with:   Anesthesia Plan Comments:         Anesthesia Quick Evaluation

## 2016-07-21 NOTE — Transfer of Care (Signed)
Immediate Anesthesia Transfer of Care Note  Patient: Miguel [name].  Procedure(s) Performed: Procedure(s): CYSTOSCOPY WITH STENT PLACEMENT (Right) CYSTOSCOPY WITH RETROGRADE PYELOGRAM (Right)  Patient Location: PACU   Anesthesia Type:General  Level of Consciousness: sedated and responds to stimulation  Airway & Oxygen Therapy: Patient Spontanous Breathing and Patient connected to face mask oxygen  Post-op Assessment: Report given to RN and Post -op Vital signs reviewed and stable  Post vital signs: Reviewed and stable  Last Vitals:  Vitals:   07/21/16 1116 07/21/16 1117  BP: (P) 131/86 131/86  Pulse: (P) 67 (!) 58  Resp:  15  Temp: (P) 36.1 C     Last Pain:  Vitals:   07/21/16 1012  TempSrc: Tympanic         Complications: No apparent anesthesia complications

## 2016-07-22 ENCOUNTER — Other Ambulatory Visit: Payer: Self-pay | Admitting: General Surgery

## 2016-07-22 ENCOUNTER — Encounter: Payer: Self-pay | Admitting: Urology

## 2016-07-23 ENCOUNTER — Ambulatory Visit (HOSPITAL_COMMUNITY)
Admission: RE | Admit: 2016-07-23 | Discharge: 2016-07-23 | Disposition: A | Discharge status: Home / Self Care | Payer: Medicare Other | Source: Ambulatory Visit | Attending: Interventional Radiology | Admitting: Interventional Radiology

## 2016-07-23 ENCOUNTER — Observation Stay (HOSPITAL_COMMUNITY)
Admission: RE | Admit: 2016-07-23 | Discharge: 2016-07-24 | Disposition: A | Discharge status: Home / Self Care | Payer: Medicare Other | Source: Ambulatory Visit | Attending: Interventional Radiology | Admitting: Interventional Radiology

## 2016-07-23 ENCOUNTER — Encounter (HOSPITAL_COMMUNITY): Payer: Self-pay

## 2016-07-23 ENCOUNTER — Encounter (HOSPITAL_COMMUNITY)
Admission: RE | Disposition: A | Discharge status: Home / Self Care | Payer: Self-pay | Source: Ambulatory Visit | Attending: Interventional Radiology

## 2016-07-23 ENCOUNTER — Ambulatory Visit (HOSPITAL_COMMUNITY): Payer: Medicare Other | Admitting: Registered Nurse

## 2016-07-23 ENCOUNTER — Encounter (HOSPITAL_COMMUNITY): Payer: Self-pay | Admitting: General Practice

## 2016-07-23 DIAGNOSIS — Z7984 Long term (current) use of oral hypoglycemic drugs: Secondary | ICD-10-CM | POA: Diagnosis not present

## 2016-07-23 DIAGNOSIS — J449 Chronic obstructive pulmonary disease, unspecified: Secondary | ICD-10-CM | POA: Insufficient documentation

## 2016-07-23 DIAGNOSIS — R0600 Dyspnea, unspecified: Secondary | ICD-10-CM | POA: Diagnosis not present

## 2016-07-23 DIAGNOSIS — F1020 Alcohol dependence, uncomplicated: Secondary | ICD-10-CM | POA: Insufficient documentation

## 2016-07-23 DIAGNOSIS — Z7982 Long term (current) use of aspirin: Secondary | ICD-10-CM | POA: Diagnosis not present

## 2016-07-23 DIAGNOSIS — Z841 Family history of disorders of kidney and ureter: Secondary | ICD-10-CM | POA: Insufficient documentation

## 2016-07-23 DIAGNOSIS — Z6836 Body mass index (BMI) 36.0-36.9, adult: Secondary | ICD-10-CM | POA: Insufficient documentation

## 2016-07-23 DIAGNOSIS — K219 Gastro-esophageal reflux disease without esophagitis: Secondary | ICD-10-CM | POA: Diagnosis not present

## 2016-07-23 DIAGNOSIS — C641 Malignant neoplasm of right kidney, except renal pelvis: Principal | ICD-10-CM | POA: Insufficient documentation

## 2016-07-23 DIAGNOSIS — F419 Anxiety disorder, unspecified: Secondary | ICD-10-CM | POA: Diagnosis not present

## 2016-07-23 DIAGNOSIS — Z79899 Other long term (current) drug therapy: Secondary | ICD-10-CM | POA: Insufficient documentation

## 2016-07-23 DIAGNOSIS — Z800 Family history of malignant neoplasm of digestive organs: Secondary | ICD-10-CM | POA: Insufficient documentation

## 2016-07-23 DIAGNOSIS — I10 Essential (primary) hypertension: Secondary | ICD-10-CM | POA: Diagnosis not present

## 2016-07-23 DIAGNOSIS — Z8041 Family history of malignant neoplasm of ovary: Secondary | ICD-10-CM | POA: Diagnosis not present

## 2016-07-23 DIAGNOSIS — I4891 Unspecified atrial fibrillation: Secondary | ICD-10-CM | POA: Insufficient documentation

## 2016-07-23 DIAGNOSIS — E785 Hyperlipidemia, unspecified: Secondary | ICD-10-CM | POA: Diagnosis not present

## 2016-07-23 DIAGNOSIS — N2889 Other specified disorders of kidney and ureter: Secondary | ICD-10-CM

## 2016-07-23 DIAGNOSIS — K222 Esophageal obstruction: Secondary | ICD-10-CM | POA: Diagnosis not present

## 2016-07-23 DIAGNOSIS — Z8501 Personal history of malignant neoplasm of esophagus: Secondary | ICD-10-CM | POA: Insufficient documentation

## 2016-07-23 DIAGNOSIS — F0390 Unspecified dementia without behavioral disturbance: Secondary | ICD-10-CM | POA: Insufficient documentation

## 2016-07-23 DIAGNOSIS — H3530 Unspecified macular degeneration: Secondary | ICD-10-CM | POA: Diagnosis not present

## 2016-07-23 DIAGNOSIS — Z833 Family history of diabetes mellitus: Secondary | ICD-10-CM | POA: Insufficient documentation

## 2016-07-23 DIAGNOSIS — Z9841 Cataract extraction status, right eye: Secondary | ICD-10-CM | POA: Insufficient documentation

## 2016-07-23 DIAGNOSIS — E1142 Type 2 diabetes mellitus with diabetic polyneuropathy: Secondary | ICD-10-CM | POA: Diagnosis not present

## 2016-07-23 DIAGNOSIS — Z87891 Personal history of nicotine dependence: Secondary | ICD-10-CM | POA: Diagnosis not present

## 2016-07-23 DIAGNOSIS — N289 Disorder of kidney and ureter, unspecified: Secondary | ICD-10-CM | POA: Diagnosis present

## 2016-07-23 HISTORY — PX: RADIOLOGY WITH ANESTHESIA: SHX6223

## 2016-07-23 LAB — GLUCOSE, CAPILLARY
GLUCOSE-CAPILLARY: 87 mg/dL (ref 65–99)
GLUCOSE-CAPILLARY: 179 mg/dL — AB (ref 65–99)
Glucose-Capillary: 101 mg/dL — ABNORMAL HIGH (ref 65–99)
Glucose-Capillary: 103 mg/dL — ABNORMAL HIGH (ref 65–99)

## 2016-07-23 LAB — TYPE AND SCREEN
ABO/RH(D): O POS
Antibody Screen: NEGATIVE

## 2016-07-23 SURGERY — RADIOLOGY WITH ANESTHESIA
Anesthesia: General

## 2016-07-23 MED ORDER — INSULIN ASPART 100 UNIT/ML ~~LOC~~ SOLN
0.0000 [IU] | Freq: Three times a day (TID) | SUBCUTANEOUS | Status: DC
  Administered 2016-07-24: 2 [IU] via SUBCUTANEOUS

## 2016-07-23 MED ORDER — QUETIAPINE FUMARATE 50 MG PO TABS
50.0000 mg | ORAL_TABLET | Freq: Every day | ORAL | Status: DC
  Administered 2016-07-23: 50 mg via ORAL
  Filled 2016-07-23: qty 1

## 2016-07-23 MED ORDER — IOPAMIDOL (ISOVUE-300) INJECTION 61%
INTRAVENOUS | Status: AC
  Administered 2016-07-23: 30 mL
  Filled 2016-07-23: qty 30

## 2016-07-23 MED ORDER — FUROSEMIDE 20 MG PO TABS
20.0000 mg | ORAL_TABLET | Freq: Every day | ORAL | Status: DC
  Administered 2016-07-23: 20 mg via ORAL
  Filled 2016-07-23 (×2): qty 1

## 2016-07-23 MED ORDER — LIDOCAINE 2% (20 MG/ML) 5 ML SYRINGE
INTRAMUSCULAR | Status: DC | PRN
  Administered 2016-07-23: 60 mg via INTRAVENOUS

## 2016-07-23 MED ORDER — MOMETASONE FURO-FORMOTEROL FUM 200-5 MCG/ACT IN AERO
2.0000 | INHALATION_SPRAY | Freq: Two times a day (BID) | RESPIRATORY_TRACT | Status: DC
  Administered 2016-07-23 – 2016-07-24 (×2): 2 via RESPIRATORY_TRACT
  Filled 2016-07-23: qty 8.8

## 2016-07-23 MED ORDER — PANTOPRAZOLE SODIUM 40 MG PO TBEC
40.0000 mg | DELAYED_RELEASE_TABLET | Freq: Every day | ORAL | Status: DC
  Filled 2016-07-23: qty 1

## 2016-07-23 MED ORDER — LACTATED RINGERS IV SOLN
INTRAVENOUS | Status: DC
  Administered 2016-07-23 (×2): via INTRAVENOUS

## 2016-07-23 MED ORDER — DOCUSATE SODIUM 100 MG PO CAPS
100.0000 mg | ORAL_CAPSULE | Freq: Two times a day (BID) | ORAL | Status: DC
Start: 2016-07-23 — End: 2016-07-24
  Administered 2016-07-23: 100 mg via ORAL
  Filled 2016-07-23 (×2): qty 1

## 2016-07-23 MED ORDER — FENTANYL CITRATE (PF) 100 MCG/2ML IJ SOLN
INTRAMUSCULAR | Status: DC | PRN
  Administered 2016-07-23: 100 ug via INTRAVENOUS

## 2016-07-23 MED ORDER — SUGAMMADEX SODIUM 200 MG/2ML IV SOLN
INTRAVENOUS | Status: DC | PRN
  Administered 2016-07-23: 500 mg via INTRAVENOUS

## 2016-07-23 MED ORDER — PHENYLEPHRINE HCL 10 MG/ML IJ SOLN
INTRAVENOUS | Status: DC | PRN
  Administered 2016-07-23: 50 ug/min via INTRAVENOUS

## 2016-07-23 MED ORDER — ONDANSETRON HCL 4 MG/2ML IJ SOLN
4.0000 mg | Freq: Once | INTRAMUSCULAR | Status: AC
  Administered 2016-07-23: 4 mg via INTRAMUSCULAR

## 2016-07-23 MED ORDER — METFORMIN HCL 500 MG PO TABS
1000.0000 mg | ORAL_TABLET | Freq: Two times a day (BID) | ORAL | Status: DC
  Administered 2016-07-24: 1000 mg via ORAL
  Filled 2016-07-23: qty 2

## 2016-07-23 MED ORDER — SENNOSIDES-DOCUSATE SODIUM 8.6-50 MG PO TABS: 1.0000 | ORAL_TABLET | Freq: Every day | ORAL | Status: DC | PRN

## 2016-07-23 MED ORDER — SODIUM CHLORIDE 0.9 % IV SOLN
INTRAVENOUS | Status: DC
  Administered 2016-07-23: 23:00:00 via INTRAVENOUS

## 2016-07-23 MED ORDER — GABAPENTIN 300 MG PO CAPS
300.0000 mg | ORAL_CAPSULE | Freq: Two times a day (BID) | ORAL | Status: DC
  Administered 2016-07-23: 300 mg via ORAL
  Filled 2016-07-23 (×2): qty 1

## 2016-07-23 MED ORDER — CEPHALEXIN 500 MG PO CAPS
500.0000 mg | ORAL_CAPSULE | Freq: Three times a day (TID) | ORAL | Status: DC
  Administered 2016-07-23: 500 mg via ORAL
  Filled 2016-07-23 (×2): qty 1

## 2016-07-23 MED ORDER — SODIUM CHLORIDE 0.9 % IV SOLN
INTRAVENOUS | Status: AC
  Filled 2016-07-23: qty 250

## 2016-07-23 MED ORDER — DONEPEZIL HCL 10 MG PO TABS
10.0000 mg | ORAL_TABLET | Freq: Every day | ORAL | Status: DC
  Administered 2016-07-23: 10 mg via ORAL
  Filled 2016-07-23: qty 1

## 2016-07-23 MED ORDER — ROCURONIUM BROMIDE 10 MG/ML (PF) SYRINGE
PREFILLED_SYRINGE | INTRAVENOUS | Status: DC | PRN
  Administered 2016-07-23 (×2): 50 mg via INTRAVENOUS

## 2016-07-23 MED ORDER — FENTANYL CITRATE (PF) 100 MCG/2ML IJ SOLN
25.0000 ug | INTRAMUSCULAR | Status: DC | PRN
  Administered 2016-07-23 (×2): 50 ug via INTRAVENOUS

## 2016-07-23 MED ORDER — FENTANYL CITRATE (PF) 100 MCG/2ML IJ SOLN
INTRAMUSCULAR | Status: AC
  Filled 2016-07-23: qty 2

## 2016-07-23 MED ORDER — POLYETHYLENE GLYCOL 3350 17 G PO PACK
17.0000 g | PACK | Freq: Every day | ORAL | Status: DC
  Administered 2016-07-23: 17 g via ORAL
  Filled 2016-07-23 (×2): qty 1

## 2016-07-23 MED ORDER — HYDROCODONE-ACETAMINOPHEN 5-325 MG PO TABS: 1.0000 | ORAL_TABLET | ORAL | Status: DC | PRN

## 2016-07-23 MED ORDER — ATORVASTATIN CALCIUM 40 MG PO TABS
40.0000 mg | ORAL_TABLET | Freq: Every day | ORAL | Status: DC
  Administered 2016-07-23: 40 mg via ORAL
  Filled 2016-07-23 (×2): qty 1

## 2016-07-23 MED ORDER — FENTANYL CITRATE (PF) 250 MCG/5ML IJ SOLN
INTRAMUSCULAR | Status: AC
  Filled 2016-07-23: qty 10

## 2016-07-23 MED ORDER — CEFAZOLIN SODIUM-DEXTROSE 2-4 GM/100ML-% IV SOLN
2.0000 g | INTRAVENOUS | Status: AC
  Administered 2016-07-23: 2 g via INTRAVENOUS
  Filled 2016-07-23: qty 100

## 2016-07-23 MED ORDER — FESOTERODINE FUMARATE ER 8 MG PO TB24
8.0000 mg | ORAL_TABLET | Freq: Every day | ORAL | Status: DC
  Administered 2016-07-23: 8 mg via ORAL
  Filled 2016-07-23 (×2): qty 1

## 2016-07-23 MED ORDER — ONDANSETRON HCL 4 MG/2ML IJ SOLN
INTRAMUSCULAR | Status: AC
Start: 2016-07-23 — End: 2016-07-24
  Filled 2016-07-23: qty 2

## 2016-07-23 MED ORDER — ONDANSETRON HCL 4 MG/2ML IJ SOLN
4.0000 mg | Freq: Four times a day (QID) | INTRAMUSCULAR | Status: DC | PRN
  Administered 2016-07-23: 4 mg via INTRAVENOUS

## 2016-07-23 MED ORDER — PROPOFOL 10 MG/ML IV BOLUS
INTRAVENOUS | Status: DC | PRN
  Administered 2016-07-23: 20 mg via INTRAVENOUS
  Administered 2016-07-23: 30 mg via INTRAVENOUS
  Administered 2016-07-23: 100 mg via INTRAVENOUS

## 2016-07-23 MED ORDER — BELLADONNA ALKALOIDS-OPIUM 16.2-60 MG RE SUPP: 1.0000 | Freq: Four times a day (QID) | RECTAL | Status: DC | PRN

## 2016-07-23 MED ORDER — METOPROLOL TARTRATE 25 MG PO TABS
12.5000 mg | ORAL_TABLET | Freq: Two times a day (BID) | ORAL | Status: DC
  Administered 2016-07-23 – 2016-07-24 (×2): 12.5 mg via ORAL
  Filled 2016-07-23 (×2): qty 1

## 2016-07-23 MED ORDER — PHENYLEPHRINE 40 MCG/ML (10ML) SYRINGE FOR IV PUSH (FOR BLOOD PRESSURE SUPPORT)
PREFILLED_SYRINGE | INTRAVENOUS | Status: DC | PRN
  Administered 2016-07-23 (×2): 120 ug via INTRAVENOUS

## 2016-07-23 MED ORDER — TAMSULOSIN HCL 0.4 MG PO CAPS
0.4000 mg | ORAL_CAPSULE | Freq: Every day | ORAL | Status: DC
  Administered 2016-07-23: 0.4 mg via ORAL
  Filled 2016-07-23 (×2): qty 1

## 2016-07-23 MED ORDER — DEXAMETHASONE SODIUM PHOSPHATE 10 MG/ML IJ SOLN
INTRAMUSCULAR | Status: DC | PRN
  Administered 2016-07-23: 10 mg via INTRAVENOUS

## 2016-07-23 MED ORDER — PROMETHAZINE HCL 25 MG/ML IJ SOLN
6.2500 mg | INTRAMUSCULAR | Status: DC | PRN
Start: 2016-07-23 — End: 2016-07-23

## 2016-07-23 NOTE — Anesthesia Postprocedure Evaluation (Signed)
Anesthesia Post Note  Patient: Miguel [name].  Procedure(s) Performed: Procedure(s) (LRB): RENAL CRYOABLATION (N/A)  Patient location during evaluation: PACU Anesthesia Type: General Level of consciousness: awake and alert Pain management: pain level controlled Vital Signs Assessment: post-procedure vital signs reviewed and stable Respiratory status: spontaneous breathing, nonlabored ventilation, respiratory function stable and patient connected to nasal cannula oxygen Cardiovascular status: blood pressure returned to baseline and stable Postop Assessment: no signs of nausea or vomiting Anesthetic complications: no       Last Vitals:  Vitals:   07/23/16 1530 07/23/16 1545  BP: 140/82 (!) 147/86  Pulse: 79 81  Resp: 18 15  Temp:  36.4 C    Last Pain:  Vitals:   07/23/16 0908  PainSc: 0-No pain                 Calyssa Zobrist S

## 2016-07-23 NOTE — Anesthesia Preprocedure Evaluation (Signed)
Anesthesia Evaluation  Patient identified by MRN, date of birth, ID band Patient awake    Reviewed: Allergy & Precautions, NPO status , Patient's Chart, lab work & pertinent test results  History of Anesthesia Complications Negative for: history of anesthetic complications  Airway Mallampati: III  TM Distance: >3 FB Neck ROM: Full    Dental  (+) Edentulous Upper, Edentulous Lower   Pulmonary shortness of breath, COPD,  COPD inhaler, former smoker,     + decreased breath sounds      Cardiovascular hypertension, Pt. on medications and Pt. on home beta blockers + DOE  + dysrhythmias Atrial Fibrillation + Valvular Problems/Murmurs (murmur)  Rhythm:Regular     Neuro/Psych PSYCHIATRIC DISORDERS Anxiety  Neuromuscular disease (peripheral neuropathy)    GI/Hepatic Neg liver ROS, GERD  Medicated and Poorly Controlled,  Endo/Other  diabetes, Type 2, Oral Hypoglycemic AgentsMorbid obesity  Renal/GU Renal InsufficiencyRenal disease     Musculoskeletal   Abdominal   Peds  Hematology   Anesthesia Other Findings   Reproductive/Obstetrics                             Anesthesia Physical Anesthesia Plan  ASA: III  Anesthesia Plan: General   Post-op Pain Management:    Induction: Intravenous  Airway Management Planned: Oral ETT  Additional Equipment: None  Intra-op Plan:   Post-operative Plan: Extubation in OR  Informed Consent: I have reviewed the patients History and Physical, chart, labs and discussed the procedure including the risks, benefits and alternatives for the proposed anesthesia with the patient or authorized representative who has indicated his/her understanding and acceptance.   Dental advisory given  Plan Discussed with: CRNA and Surgeon  Anesthesia Plan Comments:         Anesthesia Quick Evaluation

## 2016-07-23 NOTE — Procedures (Signed)
Interventional Radiology Procedure Note  Procedure:  CT guided biopsy and cryoablation of right renal mass  Anesthesia:  General  Complications: None  Estimated Blood Loss: < 10 mL  Findings:  Hydrodissection performed between LP renal mass and right ureter.  18 g core biopsy x 1 of LP tumor. Cryoablation with 3 Galil Ice Rod CX probes.  Plan:  PACU recovery followed by overnight observation  Sayvion Vigen T. Kathlene Cote, M.D Pager:  [phone]

## 2016-07-23 NOTE — H&P (Signed)
Chief Complaint: right renal mass  Referring Physician:Dr. Hollice Espy  Supervising Physician: Aletta Edouard  Patient Status: [facility] - Out-pt  HPI: Miguel [name]. is an 83 y.o. male with a history of treated esophageal cancer who on new imaging on 05-12-16 was found to have a 3cm mass arising from the lower pole cortex of the right kidney.  No other metastatic disease was noted.  He was referred to Dr. Kathlene Cote for evaluation for ablation.  He saw him on 06-19-16 and has been set up for ablation today.  He has no new complaints since his last visit.  He did recently have a thoracentesis for a pleural effusion.  His preop CXR shows a recurrence but the patient is oxygenating well and does not complain of SOB.  Past Medical History:  Past Medical History:  Diagnosis Date  . Alcoholism ([place])   . Anxiety   . COPD (chronic obstructive pulmonary disease) ([place])   . Dementia 03/18/2016  . DM type 2 with diabetic peripheral neuropathy ([place]) 03/18/2016  . Dyspnea    slight   . Dysrhythmia    atrial fib   . Esophageal cancer ([place])   . Esophageal stricture   . GERD (gastroesophageal reflux disease) 03/18/2016  . Hyperlipidemia   . Irregular heart rhythm   . Macular degeneration   . Neuropathy ([place])   . Renal mass     Past Surgical History:  Past Surgical History:  Procedure Laterality Date  . BACK SURGERY    . CATARACT EXTRACTION    . CYSTOSCOPY W/ RETROGRADES Right 07/21/2016   Procedure: CYSTOSCOPY WITH RETROGRADE PYELOGRAM;  Surgeon: Hollice Espy, MD;  Location: [facility] ORS;  Service: Urology;  Laterality: Right;  . CYSTOSCOPY WITH STENT PLACEMENT Right 07/21/2016   Procedure: CYSTOSCOPY WITH STENT PLACEMENT;  Surgeon: Hollice Espy, MD;  Location: [facility] ORS;  Service: Urology;  Laterality: Right;  . KNEE SURGERY      Family History:  Family History  Problem Relation Age of Onset  . Ovarian cancer Sister   . Throat cancer Brother   . Diabetes Mother   . Kidney disease  Father   . Prostate cancer Neg Hx   . Kidney cancer Neg Hx     Social History:  reports that he quit smoking about 38 years ago. His smoking use included Cigarettes. He has never used smokeless tobacco. He reports that he does not drink alcohol or use drugs.  Allergies: No Known Allergies  Medications: Medications reviewed in epic  Please HPI for pertinent positives, otherwise complete 10 system ROS negative.  Mallampati Score: MD Evaluation Airway: WNL Heart: WNL Abdomen: WNL Chest/ Lungs: WNL ASA  Classification: 3 Mallampati/Airway Score: One  Physical Exam: BP 116/80 (BP Location: Right Arm)   Pulse 90   Temp 97.8 F (36.6 C) (Oral)   Resp 18   Ht 5\' 11"  (1.803 m)   Wt 258 lb 3.2 oz (117.1 kg)   SpO2 95%   BMI 36.01 kg/m  Body mass index is 36.01 kg/m. General: pleasant, WD, WN white male who is laying in bed in NAD HEENT: head is normocephalic, atraumatic.  Sclera are noninjected.  PERRL.  Ears and nose without any masses or lesions.  Mouth is pink and moist Heart: irregularly irregular.  Normal s1,s2. No obvious murmurs, gallops, or rubs noted.  Palpable radial pulses bilaterally Lungs: CTAB, no wheezes, rhonchi, or rales noted.  Respiratory effort nonlabored Abd: soft, NT, ND, +BS, no masses, hernias, or organomegaly  Psych: A&Ox3 with an appropriate affect.   Labs: Results for orders placed or performed during the hospital encounter of 07/21/16 (from the past 48 hour(s))  Glucose, capillary     Status: None   Collection Time: 07/21/16 11:23 AM  Result Value Ref Range   Glucose-Capillary 81 65 - 99 mg/dL    Imaging: No results found.  Assessment/Plan 1. Right renal mass, history of esophageal cancer We will proceed today with right renal cyroablation.  The patient has no pulmonary complaints, but Dr. Kathlene Cote felt as if he has troubles once intubated that he could move forward with a thoracentesis at that time if warranted.  He will be admitted overnight  for observation and if doing well discharged home tomorrow.   Consent has already been obtained and is in the chart.  A new consent was obtained for possible left thoracentesis if needed.  Thank you for this interesting consult.  I greatly enjoyed meeting [name]. and look forward to participating in their care.  A copy of this report was sent to the requesting provider on this date.  Electronically Signed: Henreitta Cea 07/23/2016, 11:08 AM   I spent a total of    25 Minutes in face to face in clinical consultation, greater than 50% of which was counseling/coordinating care for right renal mass

## 2016-07-23 NOTE — H&P (Deleted)
  The note originally documented on this encounter has been moved the the encounter in which it belongs.  

## 2016-07-23 NOTE — Progress Notes (Signed)
Patient ID: Efraim Kaufmann., male   DOB: 14-Jan-1930, 83 y.o.   MRN: [id number] Patient is seen in PACU s/p right renal cryoablation today by Dr. Kathlene Cote.  He is complaining of some lower abdominal pain, over his suprapubic region.  He had some discomfort similar to this he thinks since stent placement, but is really unsure at this time.  Certainly wasn't as noticeable as it is now.  He is having some nausea as well. Zofran was just given.  PE: Right flank site is c/d/I.  Minimal pain at this site.  No abdominal pain in his upper abdomen, just lower and minimal to palpation.   Foley in place with fruit punch type output, as it was prior to his procedure as well, likely from his stent placement.  A/P: 1.s/p right renal cryoablation. It sounds like he may be having some urinary spasms.  I have ordered an B&O suppository to help with this.  He is getting some prn fentanyl while in PACU as well.    He has received zofran for his nausea, but has phenergan ordered as well increase the zofran doesn't work.  Plan for DC home in am if improved and doing well.  Keiasha Diep E 4:31 PM 07/23/2016

## 2016-07-23 NOTE — Progress Notes (Signed)
Foley catheter removed at 2330. Patient is DTV by 0530. Patient has been educated to use urinal within 6 hours. Will continue to monitor.

## 2016-07-23 NOTE — Addendum Note (Signed)
Addendum  created 07/23/16 1601 by Myrtie Soman, MD   Order list changed, Order sets accessed

## 2016-07-23 NOTE — Anesthesia Procedure Notes (Signed)
Procedure Name: Intubation Date/Time: 07/23/2016 12:00 PM Performed by: Talbot Grumbling Pre-anesthesia Checklist: Patient identified, Emergency Drugs available, Suction available and Patient being monitored Patient Re-evaluated:Patient Re-evaluated prior to inductionOxygen Delivery Method: Circle system utilized Preoxygenation: Pre-oxygenation with 100% oxygen Intubation Type: IV induction Ventilation: Mask ventilation without difficulty and Oral airway inserted - appropriate to patient size Laryngoscope Size: Mac and 3 Grade View: Grade I Tube type: Oral Tube size: 8.0 mm Number of attempts: 1 Airway Equipment and Method: Stylet Placement Confirmation: ETT inserted through vocal cords under direct vision,  positive ETCO2 and breath sounds checked- equal and bilateral Secured at: 22 cm Tube secured with: Tape Dental Injury: Teeth and Oropharynx as per pre-operative assessment

## 2016-07-23 NOTE — Transfer of Care (Signed)
Immediate Anesthesia Transfer of Care Note  Patient: Miguel [name].  Procedure(s) Performed: Procedure(s): RENAL CRYOABLATION (N/A)  Patient Location: PACU  Anesthesia Type:General  Level of Consciousness:  sedated, patient cooperative and responds to stimulation  Airway & Oxygen Therapy:Patient Spontanous Breathing and Patient connected to face mask oxgen  Post-op Assessment:  Report given to PACU RN and Post -op Vital signs reviewed and stable  Post vital signs:  Reviewed and stable  Last Vitals: There were no vitals filed for this visit.  Complications: No apparent anesthesia complications

## 2016-07-24 ENCOUNTER — Encounter (HOSPITAL_COMMUNITY): Payer: Self-pay | Admitting: Interventional Radiology

## 2016-07-24 ENCOUNTER — Other Ambulatory Visit: Payer: Self-pay | Admitting: Radiology

## 2016-07-24 DIAGNOSIS — N2889 Other specified disorders of kidney and ureter: Secondary | ICD-10-CM

## 2016-07-24 DIAGNOSIS — C641 Malignant neoplasm of right kidney, except renal pelvis: Secondary | ICD-10-CM | POA: Diagnosis not present

## 2016-07-24 LAB — BASIC METABOLIC PANEL
Anion gap: 8 (ref 5–15)
BUN: 30 mg/dL — ABNORMAL HIGH (ref 6–20)
CALCIUM: 8.8 mg/dL — AB (ref 8.9–10.3)
CO2: 28 mmol/L (ref 22–32)
CREATININE: 1.72 mg/dL — AB (ref 0.61–1.24)
Chloride: 102 mmol/L (ref 101–111)
GFR, EST AFRICAN AMERICAN: 40 mL/min — AB (ref >60)
GFR, EST NON AFRICAN AMERICAN: 35 mL/min — AB (ref >60)
Glucose, Bld: 139 mg/dL — ABNORMAL HIGH (ref 65–99)
Potassium: 5 mmol/L (ref 3.5–5.1)
SODIUM: 138 mmol/L (ref 135–145)

## 2016-07-24 LAB — CBC
HCT: 39.2 % (ref 39.0–52.0)
Hemoglobin: 12.8 g/dL — ABNORMAL LOW (ref 13.0–17.0)
MCH: 31.4 pg (ref 26.0–34.0)
MCHC: 32.7 g/dL (ref 30.0–36.0)
MCV: 96.1 fL (ref 78.0–100.0)
PLATELETS: 126 10*3/uL — AB (ref 150–400)
RBC: 4.08 MIL/uL — AB (ref 4.22–5.81)
RDW: 15.8 % — ABNORMAL HIGH (ref 11.5–15.5)
WBC: 7.7 10*3/uL (ref 4.0–10.5)

## 2016-07-24 LAB — GLUCOSE, CAPILLARY: GLUCOSE-CAPILLARY: 139 mg/dL — AB (ref 65–99)

## 2016-07-24 NOTE — Discharge Summary (Signed)
Patient ID: Miguel [name]. MRN: [id number] DOB/AGE: 19-Mar-1930 83 y.o.  Admit date: 07/23/2016 Discharge date: 07/24/2016  Supervising Physician: Aletta Edouard  Patient Status: [facility] - In-pt  Admission Diagnoses: Right renal mass  Discharge Diagnoses: Right renal mass, status post CT-guided percutaneous core biopsy and cryoablation on 07/23/16 Active Problems:   Right renal mass  Past Medical History:  Diagnosis Date  . Alcoholism ([place])   . Anxiety   . COPD (chronic obstructive pulmonary disease) ([place])   . Dementia 03/18/2016  . DM type 2 with diabetic peripheral neuropathy ([place]) 03/18/2016  . Dyspnea    slight   . Dysrhythmia    atrial fib   . Esophageal cancer ([place])   . Esophageal stricture   . GERD (gastroesophageal reflux disease) 03/18/2016  . Hyperlipidemia   . Irregular heart rhythm   . Macular degeneration   . Neuropathy ([place])   . Renal mass    Past Surgical History:  Procedure Laterality Date  . BACK SURGERY    . CATARACT EXTRACTION    . CYSTOSCOPY W/ RETROGRADES Right 07/21/2016   Procedure: CYSTOSCOPY WITH RETROGRADE PYELOGRAM;  Surgeon: Hollice Espy, MD;  Location: [facility] ORS;  Service: Urology;  Laterality: Right;  . CYSTOSCOPY WITH STENT PLACEMENT Right 07/21/2016   Procedure: CYSTOSCOPY WITH STENT PLACEMENT;  Surgeon: Hollice Espy, MD;  Location: [facility] ORS;  Service: Urology;  Laterality: Right;  . KNEE SURGERY        Discharged Condition: good  Hospital Course: Miguel [name]. is an 83 y.o. male with a history of treated esophageal cancer who on new imaging on 05-12-16 was found to have a 3 cm mass arising from the lower pole cortex of the right kidney.  No other metastatic disease was noted.  He was referred to Dr. Kathlene Cote for evaluation for cryoablation and deemed an appropriate candidate. On 07/23/16 he underwent CT-guided percutaneous core biopsy and cryoablation of the right renal mass via general anesthesia. The patient tolerated the  procedure well and was admitted for overnight observation. During the course of his stay he was given a  B&O suppository for suspected bladder spasms which were resolved by the morning of discharge. He was able to tolerate his diet, void and ambulate without significant difficulty. He had only some mild right flank discomfort. He denied significant chest pain ,dyspnea ,cough, nausea or vomiting. Urine was slightly blood-tinged in color. Follow-up laboratory studies were stable ( see below). He'll be scheduled for follow-up with Dr. Kathlene Cote in the [place] clinic in 4 weeks. He will continue his current home medications and follow up with Dr. Erlene Quan as needed.  Consults: none  Significant Diagnostic Studies: Results for orders placed or performed during the hospital encounter of 07/23/16  Glucose, capillary  Result Value Ref Range   Glucose-Capillary 87 65 - 99 mg/dL  Glucose, capillary  Result Value Ref Range   Glucose-Capillary 101 (H) 65 - 99 mg/dL   Comment 1 Notify RN    Comment 2 Document in Chart   Basic metabolic panel  Result Value Ref Range   Sodium 138 135 - 145 mmol/L   Potassium 5.0 3.5 - 5.1 mmol/L   Chloride 102 101 - 111 mmol/L   CO2 28 22 - 32 mmol/L   Glucose, Bld 139 (H) 65 - 99 mg/dL   BUN 30 (H) 6 - 20 mg/dL   Creatinine, Ser 1.72 (H) 0.61 - 1.24 mg/dL   Calcium 8.8 (L) 8.9 - 10.3 mg/dL   GFR  calc non Af Amer 35 (L) >60 mL/min   GFR calc Af Amer 40 (L) >60 mL/min   Anion gap 8 5 - 15  CBC  Result Value Ref Range   WBC 7.7 4.0 - 10.5 K/uL   RBC 4.08 (L) 4.22 - 5.81 MIL/uL   Hemoglobin 12.8 (L) 13.0 - 17.0 g/dL   HCT 39.2 39.0 - 52.0 %   MCV 96.1 78.0 - 100.0 fL   MCH 31.4 26.0 - 34.0 pg   MCHC 32.7 30.0 - 36.0 g/dL   RDW 15.8 (H) 11.5 - 15.5 %   Platelets 126 (L) 150 - 400 K/uL  Glucose, capillary  Result Value Ref Range   Glucose-Capillary 103 (H) 65 - 99 mg/dL   Comment 1 Notify RN    Comment 2 Document in Chart   Glucose, capillary  Result Value Ref Range    Glucose-Capillary 179 (H) 65 - 99 mg/dL   Comment 1 Notify RN   Glucose, capillary  Result Value Ref Range   Glucose-Capillary 139 (H) 65 - 99 mg/dL     Treatments: CT-guided percutaneous core biopsy and cryoablation of right renal mass on 07/23/16 via general anesthesia  Discharge Exam: Blood pressure (!) 101/54, pulse 80, temperature 98 F (36.7 C), temperature source Oral, resp. rate 12, height 5\' 11"  (1.803 m), weight 258 lb 3.2 oz (117.1 kg), SpO2 93 %. Patient awake, alert. Chest with diminished breath sounds left base, right clear. Clean, intact right chest wall Port-A-Cath .Heart with iregularly irregular rhythm; abd soft, positive bowel sounds, minimal tenderness rt lat abd region. Puncture site right flank clean, dry, no hematoma, mildly tender to palpation; no sig LE edema.  Disposition: 01-Home or Self Care  Discharge Instructions    Call MD for:  difficulty breathing, headache or visual disturbances    Complete by:  As directed    Call MD for:  extreme fatigue    Complete by:  As directed    Call MD for:  hives    Complete by:  As directed    Call MD for:  persistant dizziness or light-headedness    Complete by:  As directed    Call MD for:  persistant nausea and vomiting    Complete by:  As directed    Call MD for:  redness, tenderness, or signs of infection (pain, swelling, redness, odor or green/yellow discharge around incision site)    Complete by:  As directed    Call MD for:  severe uncontrolled pain    Complete by:  As directed    Call MD for:  temperature >100.4    Complete by:  As directed    Change dressing (specify)    Complete by:  As directed    May apply Band-Aid to puncture site right flank and change daily for the next 2-3 days. May wash site with soap and water   Diet - low sodium heart healthy    Complete by:  As directed    Driving Restrictions    Complete by:  As directed    If currently driving, may resume on 4/15   Increase activity  slowly    Complete by:  As directed    Lifting restrictions    Complete by:  As directed    No heavy  lifting for 3-4 days   May shower / Bathe    Complete by:  As directed    May walk up steps    Complete by:  As directed  Allergies as of 07/24/2016   No Known Allergies     Medication List    STOP taking these medications   clotrimazole 1 % cream Commonly known as:  CLOTRIMAZOLE AF     TAKE these medications   ADVAIR DISKUS 250-50 MCG/DOSE Aepb Generic drug:  Fluticasone-Salmeterol Inhale 3 puffs into the lungs daily.   ALPRAZolam 0.25 MG tablet Commonly known as:  XANAX 1 tablet at night x 1 week then 0.5 tablet x 1 week then discontinue.   aspirin 81 MG chewable tablet Chew 1 tablet (81 mg total) by mouth daily.   atorvastatin 40 MG tablet Commonly known as:  LIPITOR Take 1 tablet (40 mg total) by mouth daily.   CENTRUM SILVER PO Take 1 tablet by mouth daily.   cephALEXin 500 MG capsule Commonly known as:  KEFLEX Take 1 capsule (500 mg total) by mouth 3 (three) times daily.   donepezil 10 MG tablet Commonly known as:  ARICEPT Take 1 tablet (10 mg total) by mouth at bedtime.   fesoterodine 8 MG Tb24 tablet Commonly known as:  TOVIAZ Take 8 mg by mouth daily.   furosemide 20 MG tablet Commonly known as:  LASIX Take 1 tablet (20 mg total) by mouth daily.   gabapentin 300 MG capsule Commonly known as:  NEURONTIN Take 1 capsule (300 mg total) by mouth 2 (two) times daily.   glucose blood test strip Use as instructed   HYDROcodone-acetaminophen 5-325 MG tablet Commonly known as:  NORCO/VICODIN Take 1-2 tablets by mouth every 6 (six) hours as needed for moderate pain.   ketoconazole 2 % cream Commonly known as:  NIZORAL Apply 1 application topically daily.   metFORMIN 1000 MG tablet Commonly known as:  GLUCOPHAGE Take 1,000 mg by mouth 2 (two) times daily with a meal.   metoprolol tartrate 25 MG tablet Commonly known as:  LOPRESSOR Take 0.5  tablets (12.5 mg total) by mouth 2 (two) times daily.   omeprazole 20 MG capsule Commonly known as:  PRILOSEC Take 1 capsule (20 mg total) by mouth 2 (two) times daily before a meal.   polyethylene glycol packet Commonly known as:  MIRALAX / GLYCOLAX Take 17 g by mouth daily.   QUEtiapine 50 MG tablet Commonly known as:  SEROQUEL Take 1 tablet (50 mg total) by mouth at bedtime.   tamsulosin 0.4 MG Caps capsule Commonly known as:  FLOMAX Take 1 capsule (0.4 mg total) by mouth daily.      Follow-up Information    YAMAGATA,GLENN T, MD Follow up.   Specialty:  Interventional Radiology Why:  Radiology will call you with follow up appointment with Dr. Kathlene Cote in the [place] clinic in 4 weeks; call [phone] or [phone] with any questions Contact information: [address] [place] [address] [phone]        Hollice Espy, MD Follow up.   Specialty:  Urology Why:  Follow-up with Dr. Erlene Quan as needed Contact information: [address] [place] [address] [phone]            Electronically Signed: D. Rowe Robert 07/24/2016, 9:39 AM   I have spent less than 30 minutes discharging Miguel [name].Marland Kitchen

## 2016-07-24 NOTE — Discharge Instructions (Signed)
Cryoablation, Care After This sheet gives you information about how to care for yourself after your procedure. Your health care provider may also give you more specific instructions. If you have problems or questions, contact your health care provider. What can I expect after the procedure? After the procedure, it is common to have:  Soreness around the treatment area.  Mild pain and swelling in the treatment area. Follow these instructions at home: Treatment area care    Follow instructions from your health care provider about how to take care of your incision. Make sure you:  Wash your hands with soap and water before you change your bandage (dressing). If soap and water are not available, use hand sanitizer.  Change your dressing as told by your health care provider.  Leave stitches (sutures) in place. They may need to stay in place for 2 weeks or longer.  Check your treatment area every day for signs of infection. Check for:  More redness, swelling, or pain.  More fluid or blood.  Warmth.  Pus or a bad smell.  Keep the treated area clean, dry, and covered with a dressing until it has healed. Clean the area with soap and water or as told by your health care provider.  You may shower if your health care provider approves. If your bandage gets wet, change it right away. Activity   Follow instructions from your health care provider about any activity limitations.  Do not drive for 24 hours if you received a medicine to help you relax (sedative). General instructions   Take over-the-counter and prescription medicines only as told by your health care provider.  Keep all follow-up visits as told by your health care provider. This is important. Contact a health care provider if:  You do not have a bowel movement for 2 days.  You have nausea or vomiting.  You have more redness, swelling, or pain around your treatment area.  You have more fluid or blood coming from your  treatment area.  Your treatment area feels warm to the touch.  You have pus or a bad smell coming from your treatment area.  You have a fever. Get help right away if:  You have severe pain.  You have trouble swallowing or breathing.  You have severe weakness or dizziness.  You have chest pain or shortness of breath. This information is not intended to replace advice given to you by your health care provider. Make sure you discuss any questions you have with your health care provider. Document Released: 01/19/2013 Document Revised: 10/19/2015 Document Reviewed: 08/29/2015 Elsevier Interactive Patient Education  2017 [employer].

## 2016-07-25 ENCOUNTER — Telehealth: Payer: Self-pay | Admitting: *Deleted

## 2016-07-25 NOTE — Telephone Encounter (Signed)
Faxed

## 2016-07-25 NOTE — Telephone Encounter (Signed)
His FMLA papers states he will be out for 4 hours and his father had surgery yesterday and he was out all day with him and his boss is asking for a letter to excuse him for the 4 hours. Please write it and call him when ready

## 2016-07-28 ENCOUNTER — Encounter: Payer: Self-pay | Admitting: Podiatry

## 2016-07-28 ENCOUNTER — Ambulatory Visit (INDEPENDENT_AMBULATORY_CARE_PROVIDER_SITE_OTHER): Payer: Medicare Other | Admitting: Podiatry

## 2016-07-28 DIAGNOSIS — B351 Tinea unguium: Secondary | ICD-10-CM | POA: Diagnosis not present

## 2016-07-28 DIAGNOSIS — E1142 Type 2 diabetes mellitus with diabetic polyneuropathy: Secondary | ICD-10-CM

## 2016-07-28 DIAGNOSIS — M79676 Pain in unspecified toe(s): Secondary | ICD-10-CM

## 2016-07-28 NOTE — Progress Notes (Signed)
Complaint:  Visit Type: Patient returns to my office for continued preventative foot care services. Complaint: Patient states" my nails have grown long and thick and become painful to walk and wear shoes" Patient has been diagnosed with DM with neuropathy.. The patient presents for preventative foot care services. No changes to ROS  Podiatric Exam: Vascular: dorsalis pedis and posterior tibial pulses are palpable bilateral. Capillary return is immediate. Temperature gradient is WNL. Skin turgor WNL  Sensorium: Normal Semmes Weinstein monofilament test. Normal tactile sensation bilaterally. Nail Exam: Pt has thick disfigured discolored nails with subungual debris noted bilateral entire nail hallux through fifth toenails Ulcer Exam: There is no evidence of ulcer or pre-ulcerative changes or infection. Orthopedic Exam: Muscle tone and strength are WNL. No limitations in general ROM. No crepitus or effusions noted. Foot type and digits show no abnormalities. Bony prominences are unremarkable. Skin: No Porokeratosis. No infection or ulcers  Diagnosis:  Onychomycosis, , Pain in right toe, pain in left toes  Treatment & Plan Procedures and Treatment: Consent by patient was obtained for treatment procedures. The patient understood the discussion of treatment and procedures well. All questions were answered thoroughly reviewed. Debridement of mycotic and hypertrophic toenails, 1 through 5 bilateral and clearing of subungual debris. No ulceration, no infection noted.  Return Visit-Office Procedure: Patient instructed to return to the office for a follow up visit 3 months for continued evaluation and treatment.    Demarius Archila DPM 

## 2016-08-05 NOTE — Progress Notes (Signed)
[name]  Telephone:([phone] [phone]  ID: Miguel [name]. OB: 07-31-1929  MR#: [id number]  EKC#:[id number]  Patient Care Team: Coral Spikes, DO as PCP - General (Family Medicine)  CHIEF COMPLAINT: Stage IIIb esophageal cancer, right renal mass.  INTERVAL HISTORY: Patient returns to clinic today for further evaluation. He currently feels well and is asymptomatic. He denies any dysphagia or difficulty swallowing. He has no neurologic complaints, although admits to worsening memory. He denies any recent fevers or illnesses. He has no chest pain, but continues to have chronic shortness of breath. He denies any nausea, vomiting, constipation, or diarrhea. He has no urinary complaints. Patient offers no further specific complaints today.  REVIEW OF SYSTEMS:   Review of Systems  Constitutional: Negative.  Negative for fever, malaise/fatigue and weight loss.  Respiratory: Positive for shortness of breath. Negative for cough.   Cardiovascular: Negative.  Negative for chest pain and leg swelling.  Gastrointestinal: Negative.  Negative for abdominal pain, heartburn, nausea and vomiting.  Genitourinary: Negative.   Musculoskeletal: Negative.   Neurological: Negative.  Negative for weakness.  Psychiatric/Behavioral: Positive for memory loss. The patient is not nervous/anxious.     As per HPI. Otherwise, a complete review of systems is negative.  PAST MEDICAL HISTORY: Past Medical History:  Diagnosis Date  . Alcoholism ([place])   . Anxiety   . COPD (chronic obstructive pulmonary disease) ([place])   . Dementia 03/18/2016  . DM type 2 with diabetic peripheral neuropathy ([place]) 03/18/2016  . Dyspnea    slight   . Dysrhythmia    atrial fib   . Esophageal cancer ([place])   . Esophageal stricture   . GERD (gastroesophageal reflux disease) 03/18/2016  . Hyperlipidemia   . Irregular heart rhythm   . Macular degeneration   . Neuropathy   . Renal mass     PAST  SURGICAL HISTORY: Past Surgical History:  Procedure Laterality Date  . BACK SURGERY    . CATARACT EXTRACTION    . CYSTOSCOPY W/ RETROGRADES Right 07/21/2016   Procedure: CYSTOSCOPY WITH RETROGRADE PYELOGRAM;  Surgeon: Hollice Espy, MD;  Location: [facility] ORS;  Service: Urology;  Laterality: Right;  . CYSTOSCOPY WITH STENT PLACEMENT Right 07/21/2016   Procedure: CYSTOSCOPY WITH STENT PLACEMENT;  Surgeon: Hollice Espy, MD;  Location: [facility] ORS;  Service: Urology;  Laterality: Right;  . KNEE SURGERY    . RADIOLOGY WITH ANESTHESIA N/A 07/23/2016   Procedure: RENAL CRYOABLATION;  Surgeon: Aletta Edouard, MD;  Location: [facility];  Service: Radiology;  Laterality: N/A;    FAMILY HISTORY: Family History  Problem Relation Age of Onset  . Ovarian cancer Sister   . Throat cancer Brother   . Diabetes Mother   . Kidney disease Father   . Prostate cancer Neg Hx   . Kidney cancer Neg Hx     ADVANCED DIRECTIVES (Y/N):  N  HEALTH MAINTENANCE: Social History  Substance Use Topics  . Smoking status: Former Smoker    Types: Cigarettes    Quit date: 70  . Smokeless tobacco: Never Used  . Alcohol use No     Comment: quit 2012      Colonoscopy:  PAP:  Bone density:  Lipid panel:  No Known Allergies  Current Outpatient Prescriptions  Medication Sig Dispense Refill  . ADVAIR DISKUS 250-50 MCG/DOSE AEPB Inhale 3 puffs into the lungs daily.     Marland Kitchen aspirin 81 MG chewable tablet Chew 1 tablet (81 mg total) by mouth daily. 30 tablet  5  . atorvastatin (LIPITOR) 40 MG tablet Take 1 tablet (40 mg total) by mouth daily. 90 tablet 3  . cephALEXin (KEFLEX) 500 MG capsule Take 1 capsule (500 mg total) by mouth 3 (three) times daily. 21 capsule 0  . donepezil (ARICEPT) 10 MG tablet Take 1 tablet (10 mg total) by mouth at bedtime. 90 tablet 1  . fesoterodine (TOVIAZ) 8 MG TB24 tablet Take 8 mg by mouth daily.    . furosemide (LASIX) 20 MG tablet Take 1 tablet (20 mg total) by mouth daily. 30 tablet 3  .  gabapentin (NEURONTIN) 300 MG capsule Take 1 capsule (300 mg total) by mouth 2 (two) times daily. 180 capsule 1  . glucose blood test strip Use as instructed 100 each 12  . ketoconazole (NIZORAL) 2 % cream Apply 1 application topically daily. 60 g 1  . metFORMIN (GLUCOPHAGE) 1000 MG tablet Take 1,000 mg by mouth 2 (two) times daily with a meal.    . metoprolol tartrate (LOPRESSOR) 25 MG tablet Take 0.5 tablets (12.5 mg total) by mouth 2 (two) times daily. 60 tablet 6  . Multiple Vitamins-Minerals (CENTRUM SILVER PO) Take 1 tablet by mouth daily.    Marland Kitchen omeprazole (PRILOSEC) 20 MG capsule Take 1 capsule (20 mg total) by mouth 2 (two) times daily before a meal. 180 capsule 3  . polyethylene glycol (MIRALAX / GLYCOLAX) packet Take 17 g by mouth daily. 14 each 0  . QUEtiapine (SEROQUEL) 50 MG tablet Take 1 tablet (50 mg total) by mouth at bedtime. 90 tablet 1  . tamsulosin (FLOMAX) 0.4 MG CAPS capsule Take 1 capsule (0.4 mg total) by mouth daily. 30 capsule 0  . HYDROcodone-acetaminophen (NORCO/VICODIN) 5-325 MG tablet Take 1-2 tablets by mouth every 6 (six) hours as needed for moderate pain. (Patient not taking: Reported on 08/06/2016) 10 tablet 0   No current facility-administered medications for this visit.    Facility-Administered Medications Ordered in Other Visits  Medication Dose Route Frequency Provider Last Rate Last Dose  . sodium chloride flush (NS) 0.9 % injection 10 mL  10 mL Intravenous PRN Lloyd Huger, MD   10 mL at 08/06/16 1405    OBJECTIVE: Vitals:   08/06/16 1422  BP: 103/69  Pulse: 86  Temp: 97.7 F (36.5 C)     Body mass index is 34.48 kg/m.    ECOG FS:0 - Asymptomatic  General: Well-developed, well-nourished, no acute distress. Eyes: Pink conjunctiva, anicteric sclera. HEENT: Normocephalic, moist mucous membranes, clear oropharnyx. Lungs: Clear to auscultation bilaterally. Heart: Regular rate and rhythm. No rubs, murmurs, or gallops. Abdomen: Soft, nontender,  nondistended. No organomegaly noted, normoactive bowel sounds. Musculoskeletal: No edema, cyanosis, or clubbing. Neuro: Alert, answering all questions appropriately. Cranial nerves grossly intact. Skin: No rashes or petechiae noted. Psych: Normal affect.  LAB RESULTS:  Lab Results  Component Value Date   NA 136 08/06/2016   K 4.3 08/06/2016   CL 102 08/06/2016   CO2 27 08/06/2016   GLUCOSE 144 (H) 08/06/2016   BUN 24 (H) 08/06/2016   CREATININE 1.57 (H) 08/06/2016   CALCIUM 8.8 (L) 08/06/2016   PROT 7.0 08/06/2016   ALBUMIN 3.7 08/06/2016   AST 24 08/06/2016   ALT 12 (L) 08/06/2016   ALKPHOS 115 08/06/2016   BILITOT 1.0 08/06/2016   GFRNONAA 39 (L) 08/06/2016   GFRAA 45 (L) 08/06/2016    Lab Results  Component Value Date   WBC 4.9 08/06/2016   NEUTROABS 3.7 08/06/2016   HGB  12.5 (L) 08/06/2016   HCT 36.6 (L) 08/06/2016   MCV 94.1 08/06/2016   PLT 179 08/06/2016     STUDIES: Dg Chest 2 View  Result Date: 07/18/2016 CLINICAL DATA:  Preprocedural exam. EXAM: CHEST  2 VIEW COMPARISON:  06/05/2016 FINDINGS: Injectable port terminates at the expected location of the cavoatrial junction. Cardiomediastinal silhouette is normal. Mediastinal contours appear intact. Calcific atherosclerotic disease and tortuosity of the aorta. There is no evidence of pneumothorax. Moderate in size left pleural effusion. Osseous structures are without acute abnormality. Soft tissues are grossly normal. IMPRESSION: Moderate in size left pleural effusion. Calcific atherosclerotic disease of the aorta. Electronically Signed   By: Fidela Salisbury M.D.   On: 07/18/2016 13:05   Mr Abdomen Wwo Contrast  Result Date: 07/09/2016 CLINICAL DATA:  Esophageal cancer. Right renal mass on CT. Alcoholism. Dementia. Hyperlipidemia. EXAM: MRI ABDOMEN WITHOUT AND WITH CONTRAST TECHNIQUE: Multiplanar multisequence MR imaging of the abdomen was performed both before and after the administration of intravenous  contrast. CONTRAST:  57m MULTIHANCE GADOBENATE DIMEGLUMINE 529 MG/ML IV SOLN COMPARISON:  CT 05/12/2016. FINDINGS: Lower chest: Mild cardiomegaly, without pericardial effusion. Hepatobiliary: Normal liver. Multiple small gallstones, without acute cholecystitis or biliary duct dilatation. Pancreas:  Normal, without mass or ductal dilatation. Spleen:  Normal in size, without focal abnormality. Adrenals/Urinary Tract: Normal adrenal glands. Bilateral renal cysts. Posterior interpolar left renal lesion measures 1.4 cm and demonstrates precontrast complexity on image 36/series 10. No post-contrast enhancement, including on subtracted images. Posteromedial inter/lower pole left renal lesion measures 1.3 cm, including on image 47/17. This also demonstrates complexity, as evidenced by minimal precontrast T1 hyperintensity on image 48/series 10. Suggestion of wall thickening on image 47/series 17. No solid enhancing nodular components. Corresponding to the CT abnormality, within the anterior lower pole right kidney, is a 3.3 x 3.4 by 3.1 cm lesion. This is similar in size to on the prior exam. Demonstrates heterogeneous T2 signal on image 15/series 5. Post-contrast enhancement, including on subtracted images. Stomach/Bowel: Normal stomach and abdominal bowel loops. Vascular/Lymphatic: Aortic atherosclerosis. accessory lower pole artery which extends anterior to the IVC on image 41/series 13. Patent renal veins. No retroperitoneal or retrocrural adenopathy. Other:  No ascites. Musculoskeletal: Convex left lumbar spine curvature with prominent right-sided mid lumbar osteophytes. IMPRESSION: 1. Lower pole right renal lesion is consistent with renal cell carcinoma. No evidence of right renal vein involvement or abdominal metastatic disease. 2. Bilateral renal cysts. Two left renal lesions which are favored to represent complex cysts. Recommend attention on follow-up. 3. Aortic atherosclerosis. Accessory lower pole right renal  artery, traversing anterior to the IVC. Electronically Signed   By: KAbigail MiyamotoM.D.   On: 07/09/2016 12:28   Ct Guide Tissue Ablation  Result Date: 07/23/2016 CLINICAL DATA:  Enlarging solid tumor originating from the lower pole of the right kidney measuring approximately 3.4 cm in greatest diameter. The patient presents for CT-guided biopsy and percutaneous cryoablation. A right ureteral stent was placed 2 days ago for ureteral protection given close proximity of the medial margin of the tumor to the proximal ureter. EXAM: CT-GUIDED CORE BIOPSY OF RIGHT RENAL MASS CT-GUIDED PERCUTANEOUS CRYOABLATION OF RIGHT RENAL MASS ANESTHESIA/SEDATION: General MEDICATIONS: 2 G IV Ancef. The antibiotic was administered in an appropriate time interval prior to needle puncture of the skin. CONTRAST:  None PROCEDURE: The procedure, risks, benefits, and alternatives were explained to the patient. Questions regarding the procedure were encouraged and answered. The patient understands and consents to the procedure. A time-out was  performed prior to initiating the procedure. The patient was placed under general anesthesia. Initial unenhanced CT was performed in a prone position to localize the right renal mass. The right flank region was prepped with chlorhexidine in a sterile fashion, and a sterile drape was applied covering the operative field. A sterile gown and sterile gloves were used for the procedure. A 17 gauge trocar needle was advanced into the right renal mass. Core biopsy was performed with an 18 gauge automated core biopsy device. A single core sample was submitted in formalin for pathologic analysis. Under CT guidance, three [place] Ice Rod CX percutaneous cryoablation probes were advanced into the right renal mass. Probe positioning was confirmed by CT prior to cryoablation. A solution of 5 mL Isovue-300 contrast was mixed with 250 mL of sterile normal saline. This fluid was utilized during the procedure for  hydrodissection. Hydrodissection was then performed via a 21 gauge needle advanced under CT guidance to a position between the medial margin of the tumor and the proximal ureter. A total of approximately 200 mL of hydrodissection fluid was injected prior to ablation. Cryoablation was performed through the 3 probes simultaneously. Initial 10 minute cycle of cryoablation was performed. This was followed by a 8 minute thaw cycle. A second 10 minute cycle of cryoablation was then performed. During ablation, periodic CT imaging was performed to monitor ice ball formation and morphology. After active thaw and 30 second cautery cycle, the cryoablation probes were removed. COMPLICATIONS: None FINDINGS: The lower pole mass emanating from the right kidney was able to be well localized without the need for administration of IV contrast. Mass diameter remains approximately 3.5 cm. Solid tissue was obtained with biopsy. Hydrodissection was successful in creating increased margin between the mass and the proximal right ureter. During ablation, CT demonstrates adequate ice ball formation encompassing the estimated margins of the tumor. IMPRESSION: CT guided percutaneous core biopsy and cryoablation of right renal mass. The patient will be observed overnight. Initial follow-up will be performed in approximately 4 weeks. Electronically Signed   By: Aletta Edouard M.D.   On: 07/23/2016 17:36   Ct Biopsy  Result Date: 07/23/2016 CLINICAL DATA:  Enlarging solid tumor originating from the lower pole of the right kidney measuring approximately 3.4 cm in greatest diameter. The patient presents for CT-guided biopsy and percutaneous cryoablation. A right ureteral stent was placed 2 days ago for ureteral protection given close proximity of the medial margin of the tumor to the proximal ureter. EXAM: CT-GUIDED CORE BIOPSY OF RIGHT RENAL MASS CT-GUIDED PERCUTANEOUS CRYOABLATION OF RIGHT RENAL MASS ANESTHESIA/SEDATION: General MEDICATIONS:  2 G IV Ancef. The antibiotic was administered in an appropriate time interval prior to needle puncture of the skin. CONTRAST:  None PROCEDURE: The procedure, risks, benefits, and alternatives were explained to the patient. Questions regarding the procedure were encouraged and answered. The patient understands and consents to the procedure. A time-out was performed prior to initiating the procedure. The patient was placed under general anesthesia. Initial unenhanced CT was performed in a prone position to localize the right renal mass. The right flank region was prepped with chlorhexidine in a sterile fashion, and a sterile drape was applied covering the operative field. A sterile gown and sterile gloves were used for the procedure. A 17 gauge trocar needle was advanced into the right renal mass. Core biopsy was performed with an 18 gauge automated core biopsy device. A single core sample was submitted in formalin for pathologic analysis. Under CT guidance, three [facility]  Ice Rod CX percutaneous cryoablation probes were advanced into the right renal mass. Probe positioning was confirmed by CT prior to cryoablation. A solution of 5 mL Isovue-300 contrast was mixed with 250 mL of sterile normal saline. This fluid was utilized during the procedure for hydrodissection. Hydrodissection was then performed via a 21 gauge needle advanced under CT guidance to a position between the medial margin of the tumor and the proximal ureter. A total of approximately 200 mL of hydrodissection fluid was injected prior to ablation. Cryoablation was performed through the 3 probes simultaneously. Initial 10 minute cycle of cryoablation was performed. This was followed by a 8 minute thaw cycle. A second 10 minute cycle of cryoablation was then performed. During ablation, periodic CT imaging was performed to monitor ice ball formation and morphology. After active thaw and 30 second cautery cycle, the cryoablation probes were removed.  COMPLICATIONS: None FINDINGS: The lower pole mass emanating from the right kidney was able to be well localized without the need for administration of IV contrast. Mass diameter remains approximately 3.5 cm. Solid tissue was obtained with biopsy. Hydrodissection was successful in creating increased margin between the mass and the proximal right ureter. During ablation, CT demonstrates adequate ice ball formation encompassing the estimated margins of the tumor. IMPRESSION: CT guided percutaneous core biopsy and cryoablation of right renal mass. The patient will be observed overnight. Initial follow-up will be performed in approximately 4 weeks. Electronically Signed   By: Aletta Edouard M.D.   On: 07/23/2016 17:36    ONCOLOGY HISTORY: Initial diagnosis was in approximately October 2016. This was followed by concurrent chemotherapy with infusional 5-FU and XRT.  Patient received low-dose weekly carboplatinum and Taxol in approximately April and May 2017. He subsequently had a second recurrence and was initiated on Taxol 40 mg/m on days 1, 8, and 15 (Taxol was dose reduced 50% secondary to neuropathy) along with Ramucirumab 8 mg/m on days 1 and 8 starting September 27, 2015 through January 24, 2016. This was a 28 day cycle.   ASSESSMENT: Stage IIIb esophageal cancer, right renal mass  PLAN:    1. Stage IIIb esophageal cancer: Patient was noted to be HER-2 negative. Please see oncology history listed above. CT scan results from June 03, 2016 reviewed independently with no obvious evidence of recurrent or metastatic disease. Essentially unchanged from scans at outside facility dating back to November 19, 2015.  Return to clinic in mid June with repeat imaging and further evaluation. If patient has recurrent disease, would reinitiate dose reduced Taxol and Ramucirumab. 2. Neuropathy: Continue gabapentin and hydrocodone as prescribed. 3. Esophageal stricture: Patient has a history of multiple esophageal  dilations. Will refer to GI in the future if necessary. 4. Right renal mass: MRI results reviewed independently and reported as above. Patient had cryoablation performed on July 23, 2016.  5. Renal insufficiency: Patient's creatinine is approximately his baseline, monitor.   Patient expressed understanding and was in agreement with this plan. He also understands that He can call clinic at any time with any questions, concerns, or complaints.   Cancer Staging Squamous cell esophageal cancer [facility]) Staging form: Esophagus - Squamous Cell Carcinoma, AJCC 7th Edition - Clinical stage from 02/27/2016: Stage IIIB (T3, N2, M0) - Signed by Lloyd Huger, MD on 02/27/2016   Lloyd Huger, MD   08/06/2016 2:31 PM

## 2016-08-06 ENCOUNTER — Inpatient Hospital Stay: Payer: Medicare Other | Attending: Oncology

## 2016-08-06 ENCOUNTER — Inpatient Hospital Stay: Payer: Medicare Other

## 2016-08-06 ENCOUNTER — Inpatient Hospital Stay (HOSPITAL_BASED_OUTPATIENT_CLINIC_OR_DEPARTMENT_OTHER): Payer: Medicare Other | Admitting: Oncology

## 2016-08-06 ENCOUNTER — Other Ambulatory Visit: Payer: Self-pay

## 2016-08-06 VITALS — BP 103/69 | HR 86 | Temp 97.7°F | Wt 247.2 lb

## 2016-08-06 DIAGNOSIS — J449 Chronic obstructive pulmonary disease, unspecified: Secondary | ICD-10-CM | POA: Insufficient documentation

## 2016-08-06 DIAGNOSIS — Z95828 Presence of other vascular implants and grafts: Secondary | ICD-10-CM

## 2016-08-06 DIAGNOSIS — E119 Type 2 diabetes mellitus without complications: Secondary | ICD-10-CM

## 2016-08-06 DIAGNOSIS — F0390 Unspecified dementia without behavioral disturbance: Secondary | ICD-10-CM | POA: Diagnosis not present

## 2016-08-06 DIAGNOSIS — Z7984 Long term (current) use of oral hypoglycemic drugs: Secondary | ICD-10-CM

## 2016-08-06 DIAGNOSIS — K219 Gastro-esophageal reflux disease without esophagitis: Secondary | ICD-10-CM | POA: Insufficient documentation

## 2016-08-06 DIAGNOSIS — Z9221 Personal history of antineoplastic chemotherapy: Secondary | ICD-10-CM | POA: Diagnosis not present

## 2016-08-06 DIAGNOSIS — I4891 Unspecified atrial fibrillation: Secondary | ICD-10-CM | POA: Diagnosis not present

## 2016-08-06 DIAGNOSIS — N2889 Other specified disorders of kidney and ureter: Secondary | ICD-10-CM | POA: Insufficient documentation

## 2016-08-06 DIAGNOSIS — Z800 Family history of malignant neoplasm of digestive organs: Secondary | ICD-10-CM | POA: Diagnosis not present

## 2016-08-06 DIAGNOSIS — T451X5S Adverse effect of antineoplastic and immunosuppressive drugs, sequela: Secondary | ICD-10-CM

## 2016-08-06 DIAGNOSIS — Z7982 Long term (current) use of aspirin: Secondary | ICD-10-CM | POA: Insufficient documentation

## 2016-08-06 DIAGNOSIS — Z79899 Other long term (current) drug therapy: Secondary | ICD-10-CM | POA: Diagnosis not present

## 2016-08-06 DIAGNOSIS — Z8041 Family history of malignant neoplasm of ovary: Secondary | ICD-10-CM | POA: Diagnosis not present

## 2016-08-06 DIAGNOSIS — Z87891 Personal history of nicotine dependence: Secondary | ICD-10-CM | POA: Insufficient documentation

## 2016-08-06 DIAGNOSIS — C159 Malignant neoplasm of esophagus, unspecified: Secondary | ICD-10-CM | POA: Diagnosis present

## 2016-08-06 DIAGNOSIS — E785 Hyperlipidemia, unspecified: Secondary | ICD-10-CM

## 2016-08-06 DIAGNOSIS — K222 Esophageal obstruction: Secondary | ICD-10-CM | POA: Diagnosis not present

## 2016-08-06 DIAGNOSIS — G620 Drug-induced polyneuropathy: Secondary | ICD-10-CM | POA: Insufficient documentation

## 2016-08-06 DIAGNOSIS — H3530 Unspecified macular degeneration: Secondary | ICD-10-CM

## 2016-08-06 LAB — CBC WITH DIFFERENTIAL/PLATELET
Basophils Absolute: 0 10*3/uL (ref 0–0.1)
Basophils Relative: 1 %
Eosinophils Absolute: 0.2 10*3/uL (ref 0–0.7)
Eosinophils Relative: 4 %
HEMATOCRIT: 36.6 % — AB (ref 40.0–52.0)
HEMOGLOBIN: 12.5 g/dL — AB (ref 13.0–18.0)
LYMPHS PCT: 13 %
Lymphs Abs: 0.6 10*3/uL — ABNORMAL LOW (ref 1.0–3.6)
MCH: 32.1 pg (ref 26.0–34.0)
MCHC: 34.2 g/dL (ref 32.0–36.0)
MCV: 94.1 fL (ref 80.0–100.0)
MONOS PCT: 8 %
Monocytes Absolute: 0.4 10*3/uL (ref 0.2–1.0)
NEUTROS ABS: 3.7 10*3/uL (ref 1.4–6.5)
NEUTROS PCT: 76 %
Platelets: 179 10*3/uL (ref 150–440)
RBC: 3.89 MIL/uL — ABNORMAL LOW (ref 4.40–5.90)
RDW: 17 % — ABNORMAL HIGH (ref 11.5–14.5)
WBC: 4.9 10*3/uL (ref 3.8–10.6)

## 2016-08-06 LAB — COMPREHENSIVE METABOLIC PANEL
ALK PHOS: 115 U/L (ref 38–126)
ALT: 12 U/L — AB (ref 17–63)
AST: 24 U/L (ref 15–41)
Albumin: 3.7 g/dL (ref 3.5–5.0)
Anion gap: 7 (ref 5–15)
BILIRUBIN TOTAL: 1 mg/dL (ref 0.3–1.2)
BUN: 24 mg/dL — ABNORMAL HIGH (ref 6–20)
CALCIUM: 8.8 mg/dL — AB (ref 8.9–10.3)
CO2: 27 mmol/L (ref 22–32)
CREATININE: 1.57 mg/dL — AB (ref 0.61–1.24)
Chloride: 102 mmol/L (ref 101–111)
GFR, EST AFRICAN AMERICAN: 45 mL/min — AB (ref >60)
GFR, EST NON AFRICAN AMERICAN: 39 mL/min — AB (ref >60)
Glucose, Bld: 144 mg/dL — ABNORMAL HIGH (ref 65–99)
Potassium: 4.3 mmol/L (ref 3.5–5.1)
SODIUM: 136 mmol/L (ref 135–145)
TOTAL PROTEIN: 7 g/dL (ref 6.5–8.1)

## 2016-08-06 MED ORDER — HEPARIN SOD (PORK) LOCK FLUSH 100 UNIT/ML IV SOLN
500.0000 [IU] | Freq: Once | INTRAVENOUS | Status: AC
  Administered 2016-08-06: 500 [IU] via INTRAVENOUS

## 2016-08-06 MED ORDER — SODIUM CHLORIDE 0.9% FLUSH
10.0000 mL | INTRAVENOUS | Status: DC | PRN
  Administered 2016-08-06: 10 mL via INTRAVENOUS
  Filled 2016-08-06: qty 10

## 2016-08-06 NOTE — Progress Notes (Signed)
Patient denies new pain or discomfort.  Patient states he's always had lower back pain from arthritis

## 2016-08-08 ENCOUNTER — Ambulatory Visit (INDEPENDENT_AMBULATORY_CARE_PROVIDER_SITE_OTHER): Payer: Medicare Other | Admitting: Urology

## 2016-08-08 ENCOUNTER — Encounter: Payer: Self-pay | Admitting: Urology

## 2016-08-08 VITALS — BP 118/79 | HR 88 | Ht 71.0 in | Wt 255.7 lb

## 2016-08-08 DIAGNOSIS — N2889 Other specified disorders of kidney and ureter: Secondary | ICD-10-CM | POA: Diagnosis not present

## 2016-08-08 DIAGNOSIS — N183 Chronic kidney disease, stage 3 unspecified: Secondary | ICD-10-CM

## 2016-08-08 LAB — URINALYSIS, COMPLETE
Bilirubin, UA: NEGATIVE
GLUCOSE, UA: NEGATIVE
KETONES UA: NEGATIVE
NITRITE UA: NEGATIVE
Protein, UA: NEGATIVE
Urobilinogen, Ur: 0.2 mg/dL (ref 0.2–1.0)
pH, UA: 5 (ref 5.0–7.5)

## 2016-08-08 LAB — MICROSCOPIC EXAMINATION: Bacteria, UA: NONE SEEN

## 2016-08-08 MED ORDER — CIPROFLOXACIN HCL 500 MG PO TABS
500.0000 mg | ORAL_TABLET | Freq: Once | ORAL | Status: AC
  Administered 2016-08-08: 500 mg via ORAL

## 2016-08-08 MED ORDER — LIDOCAINE HCL 2 % EX GEL
1.0000 "application " | Freq: Once | CUTANEOUS | Status: AC
  Administered 2016-08-08: 1 via URETHRAL

## 2016-08-08 NOTE — Progress Notes (Signed)
   08/08/16  CC:  Chief Complaint  Patient presents with  . Cysto Stent Removal    HPI: 83 year old male with history of esophageal cancer as well as a 3.2 heterogeneous right lower pole renal mass who recently underwent cryoablation of this lesion with Dr. Kathlene Cote. Prior to the procedure, he underwent cystoscopy, right ureteral stent placement to help for ureteral identification during the procedure. He returns to the office today for cystoscopy, stent removal.  He reports the procedure went well and he had no issues. He denies any flank pain or gross hematuria today.  Renal biopsy consistent with RCC, Fuhrman grade 2.  Blood pressure 118/79, height 5\' 11"  (1.803 m), weight 255 lb 11.2 oz (116 kg). NED. A&Ox3.   No respiratory distress   Abd soft, NT, ND Normal phallus with bilateral descended testicles  Cystoscopy/ Stent removal procedure  Patient identification was confirmed, informed consent was obtained, and patient was prepped using Betadine solution.  Lidocaine jelly was administered per urethral meatus.    Preoperative abx where received prior to procedure.    Procedure: - Flexible cystoscope introduced, without any difficulty.   - Thorough search of the bladder revealed:    normal urethral meatus  Stent seen emanating from right ureteral orifice, grasped with stent graspers, and removed in entirety.    Post-Procedure: - Patient tolerated the procedure well   Assessment/ Plan:  1. Renal mass s/p right renal cryoablation on 07/23/16 by Dr. Kathlene Cote Follow-up imaging scheduled for next month Mass consistent with renal cell carcinoma (clear cell), Fuhrman grade 2 He will need routine surveillance either by interventional radiology or myself, - Urinalysis, Complete - ciprofloxacin (CIPRO) tablet 500 mg; Take 1 tablet (500 mg total) by mouth once. - lidocaine (XYLOCAINE) 2 % jelly 1 application; Place 1 application into the urethra once.  2. CKD (chronic kidney  disease), stage III Baseline CKD  Return in about 6 months (around 02/07/2017) for recheck renal mass.  Hollice Espy, MD

## 2016-08-16 ENCOUNTER — Other Ambulatory Visit: Payer: Self-pay | Admitting: Family Medicine

## 2016-08-27 ENCOUNTER — Other Ambulatory Visit: Payer: Self-pay

## 2016-08-27 MED ORDER — FESOTERODINE FUMARATE ER 8 MG PO TB24: 8.0000 mg | ORAL_TABLET | Freq: Every day | ORAL | 2 refills | Status: DC

## 2016-08-27 NOTE — Telephone Encounter (Signed)
Historical medication. Pt urinates frequently without it.

## 2016-09-09 ENCOUNTER — Ambulatory Visit
Admission: RE | Admit: 2016-09-09 | Discharge: 2016-09-09 | Disposition: A | Discharge status: Home / Self Care | Payer: Medicare Other | Source: Ambulatory Visit | Attending: Radiology | Admitting: Radiology

## 2016-09-09 DIAGNOSIS — N2889 Other specified disorders of kidney and ureter: Secondary | ICD-10-CM

## 2016-09-09 HISTORY — PX: IR RADIOLOGIST EVAL & MGMT: IMG5224

## 2016-09-09 NOTE — Progress Notes (Signed)
Chief Complaint: Status post cryoablation of a right renal carcinoma on 07/23/2016.  History of Present Illness: Miguel Catena. is a 83 y.o. male status post core biopsy and cryoablation of a 3.5 cm right lower pole renal mass on 07/23/2016. Biopsy demonstrated a clear cell carcinoma, Fuhrman nuclear grade 2. The procedure was well-tolerated and after overnight observation, Miguel [name] was discharged without any symptoms. A ureteral stent was placed by Dr. Erlene Quan prior to the procedure due to close proximity of the mass to the proximal ureter. The ureteral stent was removed by Dr. Erlene Quan on 08/08/2016 without difficulty. Miguel [name] has no current complaint and is urinating normally. He had a follow-up visit with Dr. Grayland Ormond on 08/06/2016 for his history of esophageal carcinoma.  Past Medical History:  Diagnosis Date  . Alcoholism ([place])   . Anxiety   . COPD (chronic obstructive pulmonary disease) ([place])   . Dementia 03/18/2016  . DM type 2 with diabetic peripheral neuropathy ([place]) 03/18/2016  . Dyspnea    slight   . Dysrhythmia    atrial fib   . Esophageal cancer ([place])   . Esophageal stricture   . GERD (gastroesophageal reflux disease) 03/18/2016  . Hyperlipidemia   . Irregular heart rhythm   . Macular degeneration   . Neuropathy   . Renal mass     Past Surgical History:  Procedure Laterality Date  . BACK SURGERY    . CATARACT EXTRACTION    . CYSTOSCOPY W/ RETROGRADES Right 07/21/2016   Procedure: CYSTOSCOPY WITH RETROGRADE PYELOGRAM;  Surgeon: Hollice Espy, MD;  Location: [facility] ORS;  Service: Urology;  Laterality: Right;  . CYSTOSCOPY WITH STENT PLACEMENT Right 07/21/2016   Procedure: CYSTOSCOPY WITH STENT PLACEMENT;  Surgeon: Hollice Espy, MD;  Location: [facility] ORS;  Service: Urology;  Laterality: Right;  . KNEE SURGERY    . RADIOLOGY WITH ANESTHESIA N/A 07/23/2016   Procedure: RENAL CRYOABLATION;  Surgeon: Aletta Edouard, MD;  Location: [facility];  Service: Radiology;   Laterality: N/A;    Allergies: Patient has no known allergies.  Medications: Prior to Admission medications   Medication Sig Start Date End Date Taking? Authorizing Provider  ADVAIR DISKUS 250-50 MCG/DOSE AEPB Inhale 3 puffs into the lungs daily.  01/22/16  Yes [provider]  aspirin 81 MG chewable tablet Chew 1 tablet (81 mg total) by mouth daily. 06/07/16  Yes Theodoro Grist, MD  atorvastatin (LIPITOR) 40 MG tablet Take 1 tablet (40 mg total) by mouth daily. 03/20/16  Yes Cook, Jayce G, DO  donepezil (ARICEPT) 10 MG tablet Take 1 tablet (10 mg total) by mouth at bedtime. 06/16/16  Yes Cook, Jayce G, DO  fesoterodine (TOVIAZ) 8 MG TB24 tablet Take 1 tablet (8 mg total) by mouth daily. 08/27/16  Yes Cook, Jayce G, DO  furosemide (LASIX) 20 MG tablet Take 1 tablet (20 mg total) by mouth daily. 04/17/16  Yes Cook, Jayce G, DO  gabapentin (NEURONTIN) 300 MG capsule Take 1 capsule (300 mg total) by mouth 2 (two) times daily. 06/09/16  Yes Cook, [place] G, DO  glucose blood test strip Use as instructed 04/17/16  Yes Cook, Jayce G, DO  ketoconazole (NIZORAL) 2 % cream Apply 1 application topically daily. 03/25/16  Yes Cook, Jayce G, DO  metFORMIN (GLUCOPHAGE) 1000 MG tablet Take 1,000 mg by mouth 2 (two) times daily with a meal.   Yes [provider]  metoprolol tartrate (LOPRESSOR) 25 MG tablet Take 0.5 tablets (12.5 mg total) by mouth 2 (  two) times daily. 06/06/16  Yes Theodoro Grist, MD  Multiple Vitamins-Minerals (CENTRUM SILVER PO) Take 1 tablet by mouth daily.   Yes [provider]  omeprazole (PRILOSEC) 20 MG capsule Take 1 capsule (20 mg total) by mouth 2 (two) times daily before a meal. 03/18/16  Yes Cook, Jayce G, DO  polyethylene glycol (MIRALAX / GLYCOLAX) packet Take 17 g by mouth daily. 06/07/16  Yes Theodoro Grist, MD  QUEtiapine (SEROQUEL) 50 MG tablet Take 1 tablet (50 mg total) by mouth at bedtime. 06/16/16  Yes Cook, Jayce G, DO  tamsulosin (FLOMAX) 0.4 MG CAPS  capsule Take 1 capsule (0.4 mg total) by mouth daily. 07/21/16  Yes Hollice Espy, MD  cephALEXin (KEFLEX) 500 MG capsule Take 1 capsule (500 mg total) by mouth 3 (three) times daily. Patient not taking: Reported on 08/08/2016 07/18/16   Hollice Espy, MD  HYDROcodone-acetaminophen (NORCO/VICODIN) 5-325 MG tablet Take 1-2 tablets by mouth every 6 (six) hours as needed for moderate pain. Patient not taking: Reported on 09/09/2016 07/21/16   Hollice Espy, MD     Family History  Problem Relation Age of Onset  . Ovarian cancer Sister   . Throat cancer Brother   . Diabetes Mother   . Kidney disease Father   . Prostate cancer Neg Hx   . Kidney cancer Neg Hx     Social History   Social History  . Marital status: Married    Spouse name: N/A  . Number of children: N/A  . Years of education: N/A   Social History Main Topics  . Smoking status: Former Smoker    Types: Cigarettes    Quit date: 65  . Smokeless tobacco: Never Used  . Alcohol use No     Comment: quit 2012   . Drug use: No  . Sexual activity: Not Currently    Partners: Female   Other Topics Concern  . Not on file   Social History Narrative  . No narrative on file    ECOG Status: 0 - Asymptomatic  Review of Systems: A 12 point ROS discussed and pertinent positives are indicated in the HPI above.  All other systems are negative.  Review of Systems  Constitutional: Negative.   Respiratory: Negative.   Cardiovascular: Negative.   Gastrointestinal: Negative.   Genitourinary: Negative.   Musculoskeletal: Negative.   Neurological: Negative.     Vital Signs: BP 102/62   Pulse 90   Temp 97.8 F (36.6 C) (Oral)   Resp 16   Ht 5\' 11"  (1.803 m)   Wt 240 lb (108.9 kg)   SpO2 97%   BMI 33.47 kg/m   Physical Exam  Constitutional: He is oriented to person, place, and time. No distress.  Abdominal: Soft. He exhibits no distension. There is no tenderness. There is no rebound and no guarding.  Musculoskeletal:  He exhibits no edema.  Neurological: He is alert and oriented to person, place, and time.  Skin: He is not diaphoretic.  Vitals reviewed.   Imaging: No results found.  Labs:  CBC:  Recent Labs  06/06/16 0526 07/18/16 0930 07/24/16 0430 08/06/16 1350  WBC 4.9 5.3 7.7 4.9  HGB 12.9* 13.9 12.8* 12.5*  HCT 38.9* 43.1 39.2 36.6*  PLT 139* 139* 126* 179    COAGS:  Recent Labs  06/03/16 1027 07/18/16 0930  INR 1.24 1.07  APTT 33 32    BMP:  Recent Labs  06/06/16 0526 07/07/16 1515 07/18/16 0930 07/24/16 0430 08/06/16 1350  NA  141  --  139 138 136  K 4.1  --  5.1 5.0 4.3  CL 103  --  100* 102 102  CO2 34*  --  31 28 27   GLUCOSE 112*  --  106* 139* 144*  BUN 25* 24* 23* 30* 24*  CALCIUM 9.0  --  9.1 8.8* 8.8*  CREATININE 1.62* 1.49* 1.62* 1.72* 1.57*  GFRNONAA 37* 41* 37* 35* 39*  GFRAA 43* 48* 43* 40* 45*    LIVER FUNCTION TESTS:  Recent Labs  03/18/16 1432 05/12/16 1003 06/03/16 0837 08/06/16 1350  BILITOT 0.9 0.7 1.0 1.0  AST 19 28 28 24   ALT 10 17 18  12*  ALKPHOS 76 91 116 115  PROT 6.2 6.7 6.8 7.0  ALBUMIN 3.7 3.7 3.7 3.7    TUMOR MARKERS:  Recent Labs  05/12/16 1003  CEA 1.4  CA199 7    Assessment and Plan:  Miguel [name] is doing well after cryoablation of a biopsy-proven 3.5 cm right clear cell renal carcinoma. Initially, renal function showed slight worsening the day after the procedure with creatinine of 1.72. Creatinine did improve to 1.57 on 08/06/2016 which is essentially his baseline with estimated GFR of 39 mL per minute. I have recommended a follow-up MRI in July, 3 months after ablation. We will check his renal function just before scheduling the MRI to make sure he can receive a half dose of gadolinium as long as his GFR is greater than 30. He would like to have his MRI performed again at [facility].   Electronically Signed: Aletta Edouard T 09/09/2016, 8:33 AM     I spent a total of 15 Minutes in face to face in  clinical consultation, greater than 50% of which was counseling/coordinating care post cryoablation of a right renal carcinoma.

## 2016-09-15 NOTE — Anesthesia Postprocedure Evaluation (Signed)
Anesthesia Post Note  Patient: Miguel [name].  Procedure(s) Performed: Procedure(s) (LRB): RENAL CRYOABLATION (N/A)     Anesthesia Post Evaluation  Last Vitals:  Vitals:   07/23/16 2130 07/24/16 0554  BP: 118/64 (!) 101/54  Pulse: 87 80  Resp: 16 12  Temp: 36.6 C 36.7 C    Last Pain:  Vitals:   07/24/16 0851  TempSrc:   PainSc: 0-No pain                 Taliya Mcclard S

## 2016-09-15 NOTE — Addendum Note (Signed)
Addendum  created 09/15/16 1311 by Myrtie Soman, MD   Sign clinical note

## 2016-09-16 ENCOUNTER — Ambulatory Visit: Payer: Medicare Other | Admitting: Family Medicine

## 2016-09-16 ENCOUNTER — Ambulatory Visit (INDEPENDENT_AMBULATORY_CARE_PROVIDER_SITE_OTHER): Payer: Medicare Other | Admitting: Family Medicine

## 2016-09-16 DIAGNOSIS — J449 Chronic obstructive pulmonary disease, unspecified: Secondary | ICD-10-CM | POA: Diagnosis not present

## 2016-09-16 DIAGNOSIS — F0390 Unspecified dementia without behavioral disturbance: Secondary | ICD-10-CM | POA: Diagnosis not present

## 2016-09-16 DIAGNOSIS — E1142 Type 2 diabetes mellitus with diabetic polyneuropathy: Secondary | ICD-10-CM | POA: Diagnosis not present

## 2016-09-16 MED ORDER — GABAPENTIN 300 MG PO CAPS: 300.0000 mg | ORAL_CAPSULE | Freq: Three times a day (TID) | ORAL | 1 refills | Status: DC

## 2016-09-16 MED ORDER — GABAPENTIN 300 MG PO CAPS: 600.0000 mg | ORAL_CAPSULE | Freq: Three times a day (TID) | ORAL | 1 refills | Status: DC

## 2016-09-16 NOTE — Patient Instructions (Signed)
Medications as prescribed.  Follow up in 6 months.  Take care  Dr. Virgina Deakins  

## 2016-09-17 ENCOUNTER — Inpatient Hospital Stay: Payer: Medicare Other

## 2016-09-17 NOTE — Progress Notes (Signed)
Subjective:  Patient ID: Miguel Kaufmann., male    DOB: 1930-03-16  Age: 83 y.o. MRN: [id number]  CC: Follow up  HPI:  83 year old male with complex past medical history presents for follow-up.  DM 2 with neuropathy  Sugars well controlled on metformin.  Patient reports that he still has ongoing issues with diabetic neuropathy. He is currently on gabapentin. He would like to discuss increasing the dose given his persistent symptoms.  COPD  Stable on Advair.  Dementia  Currently stable on Aricept, Seroquel.   Social Hx   Social History   Social History  . Marital status: Married    Spouse name: N/A  . Number of children: N/A  . Years of education: N/A   Social History Main Topics  . Smoking status: Former Smoker    Types: Cigarettes    Quit date: 54  . Smokeless tobacco: Never Used  . Alcohol use No     Comment: quit 2012   . Drug use: No  . Sexual activity: Not Currently    Partners: Female   Other Topics Concern  . Not on file   Social History Narrative  . No narrative on file    Review of Systems  Constitutional: Negative for fever.  Respiratory: Positive for shortness of breath.   Neurological: Positive for numbness.   Objective:  BP 112/62   Pulse 86   Temp 97.9 F (36.6 C) (Oral)   Resp 16   Ht 5\' 11"  (1.803 m)   Wt 257 lb 6 oz (116.7 kg)   SpO2 98%   BMI 35.90 kg/m   BP/Weight 09/16/2016 09/09/2016 2/87/6811  Systolic BP 572 620 355  Diastolic BP 62 62 79  Wt. (Lbs) 257.38 240 255.7  BMI 35.9 33.47 35.66    Physical Exam  Constitutional: He appears well-developed. No distress.  Cardiovascular: Normal rate and regular rhythm.   Pulmonary/Chest: Effort normal and breath sounds normal. He has no wheezes. He has no rales.  Neurological: He is alert.  Psychiatric: He has a normal mood and affect.  Vitals reviewed.   Lab Results  Component Value Date   WBC 4.9 08/06/2016   HGB 12.5 (L) 08/06/2016   HCT 36.6 (L) 08/06/2016   PLT 179 08/06/2016   GLUCOSE 144 (H) 08/06/2016   CHOL 203 (H) 03/18/2016   TRIG 204.0 (H) 03/18/2016   HDL 48.40 03/18/2016   LDLDIRECT 108.0 03/18/2016   ALT 12 (L) 08/06/2016   AST 24 08/06/2016   NA 136 08/06/2016   K 4.3 08/06/2016   CL 102 08/06/2016   CREATININE 1.57 (H) 08/06/2016   BUN 24 (H) 08/06/2016   CO2 27 08/06/2016   TSH 1.986 06/03/2016   INR 1.07 07/18/2016   HGBA1C 5.4 03/18/2016    Assessment & Plan:   Problem List Items Addressed This Visit      Respiratory   COPD (chronic obstructive pulmonary disease) ([place])    Stable. Continue Advair.         Endocrine   DM type 2 with diabetic peripheral neuropathy ([facility])    Stable. Neuropathy uncontrolled, however.  Increasing gabapentin.      Relevant Medications   gabapentin (NEURONTIN) 300 MG capsule     Nervous and Auditory   Dementia    Stable on Aricept, Seroquel.       Relevant Medications   gabapentin (NEURONTIN) 300 MG capsule      Meds ordered this encounter  Medications  . DISCONTD: gabapentin (NEURONTIN)  300 MG capsule    Sig: Take 2 capsules (600 mg total) by mouth 3 (three) times daily.    Dispense:  360 capsule    Refill:  1  . gabapentin (NEURONTIN) 300 MG capsule    Sig: Take 1 capsule (300 mg total) by mouth 3 (three) times daily.    Dispense:  270 capsule    Refill:  1   Follow-up: 6 months  [place]

## 2016-09-17 NOTE — Assessment & Plan Note (Signed)
Stable. Neuropathy uncontrolled, however.  Increasing gabapentin.

## 2016-09-17 NOTE — Assessment & Plan Note (Signed)
Stable on Aricept, Seroquel.

## 2016-09-17 NOTE — Assessment & Plan Note (Signed)
Stable.  Continue Advair. 

## 2016-09-18 ENCOUNTER — Other Ambulatory Visit: Payer: Self-pay | Admitting: Oncology

## 2016-09-18 ENCOUNTER — Telehealth: Payer: Self-pay | Admitting: *Deleted

## 2016-09-18 DIAGNOSIS — C159 Malignant neoplasm of esophagus, unspecified: Secondary | ICD-10-CM

## 2016-09-18 NOTE — Telephone Encounter (Signed)
Per VO Dr Grayland Ormond cancel CT Abd, order CT chest without contrast as per son Dr Darreld Mclean is concerned about his kidneys and contrast. Order sent to scheduling to cancel CT for 6/18 and to resched FU for after MRI and that CT chest without is being ordered to be done with MRI. Son aware

## 2016-09-18 NOTE — Telephone Encounter (Signed)
Son called and states he was unaware of CT appt and fu 6/18 until today when he saw it in AVS from PCP. Inquiring if he needs the CT abd as Dr Kathlene Cote said he wanted to do an MRI in July. He is asking if he should Wait to FU with [place] until after the MRI is done. The MRI has not been scheduled that I can see in the appt calendar. Please advise   Assessment and Plan:  Miguel [name] is doing well after cryoablation of a biopsy-proven 3.5 cm right clear cell renal carcinoma. Initially, renal function showed slight worsening the day after the procedure with creatinine of 1.72. Creatinine did improve to 1.57 on 08/06/2016 which is essentially his baseline with estimated GFR of 39 mL per minute. I have recommended a follow-up MRI in July, 3 months after ablation. We will check his renal function just before scheduling the MRI to make sure he can receive a half dose of gadolinium as long as his GFR is greater than 30. He would like to have his MRI performed again at [facility].   Electronically Signed: Aletta Edouard T 09/09/2016, 8:33 AM

## 2016-09-24 ENCOUNTER — Inpatient Hospital Stay: Payer: Medicare Other | Attending: Oncology

## 2016-09-24 ENCOUNTER — Ambulatory Visit: Payer: Medicare Other

## 2016-09-24 DIAGNOSIS — Z9221 Personal history of antineoplastic chemotherapy: Secondary | ICD-10-CM | POA: Insufficient documentation

## 2016-09-24 DIAGNOSIS — C159 Malignant neoplasm of esophagus, unspecified: Secondary | ICD-10-CM | POA: Insufficient documentation

## 2016-09-24 DIAGNOSIS — Z452 Encounter for adjustment and management of vascular access device: Secondary | ICD-10-CM | POA: Insufficient documentation

## 2016-09-24 MED ORDER — HEPARIN SOD (PORK) LOCK FLUSH 100 UNIT/ML IV SOLN
500.0000 [IU] | Freq: Once | INTRAVENOUS | Status: AC
  Administered 2016-09-24: 500 [IU] via INTRAVENOUS

## 2016-09-24 MED ORDER — SODIUM CHLORIDE 0.9% FLUSH
10.0000 mL | INTRAVENOUS | Status: DC | PRN
  Administered 2016-09-24: 10 mL via INTRAVENOUS
  Filled 2016-09-24: qty 10

## 2016-09-26 ENCOUNTER — Encounter: Payer: Self-pay | Admitting: Interventional Radiology

## 2016-09-29 ENCOUNTER — Encounter: Payer: Self-pay | Admitting: Interventional Radiology

## 2016-09-29 ENCOUNTER — Ambulatory Visit: Payer: Medicare Other | Admitting: Oncology

## 2016-10-07 ENCOUNTER — Encounter: Payer: Self-pay | Admitting: Internal Medicine

## 2016-10-07 ENCOUNTER — Ambulatory Visit (INDEPENDENT_AMBULATORY_CARE_PROVIDER_SITE_OTHER): Payer: Medicare Other | Admitting: Internal Medicine

## 2016-10-07 VITALS — BP 114/60 | HR 78 | Ht 72.0 in | Wt 266.8 lb

## 2016-10-07 DIAGNOSIS — I5030 Unspecified diastolic (congestive) heart failure: Secondary | ICD-10-CM

## 2016-10-07 DIAGNOSIS — I481 Persistent atrial fibrillation: Secondary | ICD-10-CM | POA: Diagnosis not present

## 2016-10-07 DIAGNOSIS — I4819 Other persistent atrial fibrillation: Secondary | ICD-10-CM

## 2016-10-07 DIAGNOSIS — R0789 Other chest pain: Secondary | ICD-10-CM | POA: Diagnosis not present

## 2016-10-07 NOTE — Progress Notes (Signed)
Follow-up Outpatient Visit Date: 10/07/2016  Primary Care Provider: Ronald Pippins [address] Kristeen [address] [place] [address]  Chief Complaint: Follow-up atrial fibrillation  HPI:  Mr. Morandi is a 83 y.o. year-old male with history of persistent atrial fibrillation, heart failure with preserved ejection fraction, type 2 diabetes mellitus, hyperlipidemia, COPD, esophageal cancer, renal cell carcinoma, chronic kidney disease stage III, and dementia, who presents for follow-up of atrial fibrillation. I last saw him on 07/02/16 in anticipation of renal mass cryoablation. At that time, Mr. Harkins was feeling well with the exception of chronic exertional dyspnea. We agreed to obtain a myocardial perfusion stress test, which did not show any significant abnormalities other than a borderline LVEF, similar to preceding echo.  Today, Mr. Michaux reports feeling well with the exception of chronic bilateral leg pain secondary to neuropathy and discomfort in the right knee with ambulation. He notes a few brief episodes of left-sided chest pain, lasting 2-3 minutes. He cannot recall details about the circumstances or quality of the pain. He walks one possible, using a cane or a walker. He has not had any exertional chest pain. He has stable exertional dyspnea but no shortness of breath at rest. He also denies palpitations and lightheadedness, as well as orthopnea and PND. Dependent leg edema is stable to improved since her last visit. He remains on aspirin without any bleeding. He reports that his renal cell cryoablation was uneventful.  Since our last visit, Mr. Klahn has not had any falls. He continues to feel that his balance is unsteady.  --------------------------------------------------------------------------------------------------  Cardiovascular History & Procedures: Cardiovascular Problems:  Persistent atrial fibrillation  Heart failure with preserved ejection fraction  Risk  Factors:  Diabetes mellitus, hyperlipidemia, age > 13, and male gender  Cath/PCI:  None  CV Surgery:  None  EP Procedures and Devices:  None  Non-Invasive Evaluation(s):  Pharmacologic MPI (07/07/16): Low risk study without ischemia or scar. LVEF 52%.  TTE (06/03/16): Normal LV size with low normal contraction; LVEF 50-55%. Normal RV size and function. Mild left atrial enlargement. No significant valvular abnormalities.  Recent CV Pertinent Labs: Lab Results  Component Value Date   CHOL 203 (H) 03/18/2016   HDL 48.40 03/18/2016   LDLDIRECT 108.0 03/18/2016   TRIG 204.0 (H) 03/18/2016   CHOLHDL 4 03/18/2016   INR 1.07 07/18/2016   K 4.3 08/06/2016   BUN 24 (H) 08/06/2016   CREATININE 1.57 (H) 08/06/2016    Past medical and surgical history were reviewed and updated in EPIC.  Current Meds  Medication Sig  . ADVAIR DISKUS 250-50 MCG/DOSE AEPB Inhale 3 puffs into the lungs daily.   Marland Kitchen aspirin 81 MG chewable tablet Chew 1 tablet (81 mg total) by mouth daily.  Marland Kitchen atorvastatin (LIPITOR) 40 MG tablet Take 1 tablet (40 mg total) by mouth daily.  Marland Kitchen donepezil (ARICEPT) 10 MG tablet Take 1 tablet (10 mg total) by mouth at bedtime.  . fesoterodine (TOVIAZ) 8 MG TB24 tablet Take 1 tablet (8 mg total) by mouth daily.  . furosemide (LASIX) 20 MG tablet Take 1 tablet (20 mg total) by mouth daily.  Marland Kitchen gabapentin (NEURONTIN) 300 MG capsule Take 1 capsule (300 mg total) by mouth 3 (three) times daily.  Marland Kitchen glucose blood test strip Use as instructed  . ketoconazole (NIZORAL) 2 % cream Apply 1 application topically daily.  . metFORMIN (GLUCOPHAGE) 1000 MG tablet Take 1,000 mg by mouth 2 (two) times daily with a meal.  . metoprolol tartrate (  LOPRESSOR) 25 MG tablet Take 0.5 tablets (12.5 mg total) by mouth 2 (two) times daily.  . Multiple Vitamins-Minerals (CENTRUM SILVER PO) Take 1 tablet by mouth daily.  Marland Kitchen omeprazole (PRILOSEC) 20 MG capsule Take 1 capsule (20 mg total) by mouth 2 (two)  times daily before a meal.  . polyethylene glycol (MIRALAX / GLYCOLAX) packet Take 17 g by mouth daily.  . QUEtiapine (SEROQUEL) 50 MG tablet Take 1 tablet (50 mg total) by mouth at bedtime.  . tamsulosin (FLOMAX) 0.4 MG CAPS capsule Take 1 capsule (0.4 mg total) by mouth daily.    Allergies: Patient has no known allergies.  Social History   Social History  . Marital status: Married    Spouse name: N/A  . Number of children: N/A  . Years of education: N/A   Occupational History  . Not on file.   Social History Main Topics  . Smoking status: Former Smoker    Types: Cigarettes    Quit date: 63  . Smokeless tobacco: Never Used  . Alcohol use No     Comment: quit 2012   . Drug use: No  . Sexual activity: Not Currently    Partners: Female   Other Topics Concern  . Not on file   Social History Narrative  . No narrative on file    Family History  Problem Relation Age of Onset  . Ovarian cancer Sister   . Throat cancer Brother   . Diabetes Mother   . Kidney disease Father   . Prostate cancer Neg Hx   . Kidney cancer Neg Hx     Review of Systems: Patient reports recurrent bleeding of a small ulceration on the left forearm. Otherwise, a 12-system review of systems was performed and was negative except as noted in the HPI.  --------------------------------------------------------------------------------------------------  Physical Exam: BP 114/60 (BP Location: Left Arm, Patient Position: Sitting, Cuff Size: Large)   Pulse 78   Ht 6' (1.829 m)   Wt 266 lb 12 oz (121 kg)   BMI 36.18 kg/m   General:  Obese man, seated comfortably in the exam room. He is accompanied by his wife. HEENT: No conjunctival pallor or scleral icterus. Moist mucous membranes.  OP clear. Neck: Supple without lymphadenopathy, thyromegaly, JVD, or HJR. No carotid bruit. Lungs: Normal work of breathing. Clear to auscultation bilaterally without wheezes or crackles. Heart: Irregularly irregular  rate without murmurs, rubs, or gallops. Non-displaced PMI. Abd: Bowel sounds present. Soft, NT/ND. Unable to assess HSM due to body habitus. Ext: 1+ ankle edema bilaterally. Radial, PT, and DP pulses are 2+ bilaterally. Skin: Warm and dry without rash.  EKG:  Atrial fibrillation (ventricular rate 78 bpm). Otherwise, no significant abnormalities.  Lab Results  Component Value Date   WBC 4.9 08/06/2016   HGB 12.5 (L) 08/06/2016   HCT 36.6 (L) 08/06/2016   MCV 94.1 08/06/2016   PLT 179 08/06/2016    Lab Results  Component Value Date   NA 136 08/06/2016   K 4.3 08/06/2016   CL 102 08/06/2016   CO2 27 08/06/2016   BUN 24 (H) 08/06/2016   CREATININE 1.57 (H) 08/06/2016   GLUCOSE 144 (H) 08/06/2016   ALT 12 (L) 08/06/2016    Lab Results  Component Value Date   CHOL 203 (H) 03/18/2016   HDL 48.40 03/18/2016   LDLDIRECT 108.0 03/18/2016   TRIG 204.0 (H) 03/18/2016   CHOLHDL 4 03/18/2016    --------------------------------------------------------------------------------------------------  ASSESSMENT AND PLAN: Persistent atrial fibrillation Mr. Stucke  remains largely asymptomatic with adequate rate control. We have agreed to continue with a rate control strategy. I encouraged Mr. Blyden to consider anticoagulation with warfarin or a novel oral anticoagulant, given his CHADSVASC score of at least 3 (age x 2 and DM), but both he and his wife are hesitant to do this for the time being, given his gait instability and prior fall (though he has not fallen and at least 1 to 2 years per his report). We will readdress this when he returns for follow-up.  Heart failure with preserved ejection fraction Mr. Redmon has mild edema on exam today, which he reports is dependent and improves with leg elevation. He has stable exertional dyspnea consistent with NYHA class II. Overall, his functional capacity is most limited by right knee pain and peripheral neuropathy. We will continue his current  medication regimen, including furosemide 20 mg daily.  Atypical chest pain Mr. Chasen reports sporadic episodes of brief left-sided chest pain that are nonexertional. Myocardial perfusion stress test after her last visit was low risk. We will continue with primary prevention. No further testing or intervention at this time.  Follow-up: Return to clinic in 3 months.  Nelva Bush, MD 10/07/2016 10:54 AM

## 2016-10-07 NOTE — Patient Instructions (Signed)
Medication Instructions:  Your physician recommends that you continue on your current medications as directed. Please refer to the Current Medication list given to you today.   Labwork: none  Testing/Procedures: none  Follow-Up: Your physician wants you to follow-up in: 3 MONTHS WITH DR END. You will receive a reminder letter in the mail two months in advance. If you don't receive a letter, please call our office to schedule the follow-up appointment.  If you need a refill on your cardiac medications before your next appointment, please call your pharmacy.

## 2016-10-16 ENCOUNTER — Other Ambulatory Visit: Payer: Self-pay | Admitting: *Deleted

## 2016-10-16 ENCOUNTER — Other Ambulatory Visit (HOSPITAL_COMMUNITY): Payer: Self-pay | Admitting: Interventional Radiology

## 2016-10-16 DIAGNOSIS — C641 Malignant neoplasm of right kidney, except renal pelvis: Secondary | ICD-10-CM

## 2016-10-17 ENCOUNTER — Other Ambulatory Visit: Payer: Medicare Other

## 2016-10-18 ENCOUNTER — Other Ambulatory Visit: Payer: Self-pay | Admitting: Family Medicine

## 2016-10-22 ENCOUNTER — Other Ambulatory Visit: Payer: Self-pay | Admitting: *Deleted

## 2016-10-22 ENCOUNTER — Telehealth: Payer: Self-pay | Admitting: *Deleted

## 2016-10-22 NOTE — Telephone Encounter (Signed)
Per Ulice Dash, Dr Kathlene Cote is going to mail him orders for labs and he will drop it off at our office prior to appt

## 2016-10-22 NOTE — Telephone Encounter (Signed)
Medication Refill requested for : Advair Diskus 250-50 [facility]  Return Contact : [phone]

## 2016-10-22 NOTE — Telephone Encounter (Addendum)
Miguel [name] appt is on the 18th and he has FU with Dr Kathlene Cote on the 23 rd, He needs labs drawn for Dr Darreld Mclean and he is asking if we can draw those labs on the 18th when he comes in to keep him from having to go to the hospital to get them drawn. Please advise

## 2016-10-22 NOTE — Telephone Encounter (Signed)
Per Dr Grayland Ormond, [place] to draw labs, the only thing is that there are no lab orders entered in system to be drawn at either place. Please advise what needs to be drawn

## 2016-10-22 NOTE — Telephone Encounter (Signed)
Never prescribed  Last office visit  09/16/16 Next office visit  12/18/16

## 2016-10-23 MED ORDER — ADVAIR DISKUS 250-50 MCG/DOSE IN AEPB: 1.0000 | INHALATION_SPRAY | Freq: Two times a day (BID) | RESPIRATORY_TRACT | 3 refills | Status: DC

## 2016-10-27 ENCOUNTER — Telehealth: Payer: Self-pay | Admitting: Family Medicine

## 2016-10-27 MED ORDER — ADVAIR DISKUS 250-50 MCG/DOSE IN AEPB: 1.0000 | INHALATION_SPRAY | Freq: Two times a day (BID) | RESPIRATORY_TRACT | 3 refills | Status: DC

## 2016-10-27 NOTE — Telephone Encounter (Signed)
Pt son called requesting a refill on pt's ADVAIR DISKUS 250-50 MCG/DOSE AEPB. Please advise, thank you!  Pharmacy - [place], [place]

## 2016-10-27 NOTE — Telephone Encounter (Signed)
90day supply sent.

## 2016-10-27 NOTE — Telephone Encounter (Signed)
Pt son called and wanted to know if we can change this to a 90 day supply. It costs more for 30 days. Please advise, thank you!

## 2016-10-27 NOTE — Telephone Encounter (Signed)
Refill sent to long term care pharmacy

## 2016-10-28 ENCOUNTER — Ambulatory Visit (INDEPENDENT_AMBULATORY_CARE_PROVIDER_SITE_OTHER): Payer: Medicare Other | Admitting: Family Medicine

## 2016-10-28 ENCOUNTER — Encounter: Payer: Self-pay | Admitting: Family Medicine

## 2016-10-28 ENCOUNTER — Ambulatory Visit: Payer: Medicare Other | Admitting: Family Medicine

## 2016-10-28 VITALS — BP 100/60 | HR 84 | Temp 98.1°F | Wt 256.6 lb

## 2016-10-28 DIAGNOSIS — M25561 Pain in right knee: Secondary | ICD-10-CM

## 2016-10-28 DIAGNOSIS — G8929 Other chronic pain: Secondary | ICD-10-CM

## 2016-10-28 DIAGNOSIS — H6123 Impacted cerumen, bilateral: Secondary | ICD-10-CM | POA: Diagnosis not present

## 2016-10-28 DIAGNOSIS — H6120 Impacted cerumen, unspecified ear: Secondary | ICD-10-CM | POA: Insufficient documentation

## 2016-10-28 MED ORDER — METHYLPREDNISOLONE ACETATE 40 MG/ML IJ SUSP
40.0000 mg | Freq: Once | INTRAMUSCULAR | Status: AC
  Administered 2016-10-28: 40 mg via INTRA_ARTICULAR

## 2016-10-28 NOTE — Assessment & Plan Note (Signed)
New problem, to me. Patient appears to have underlying osseous arthritis. Joint injection performed today.  Procedure: Right knee injection Consent signed and scanned into record. Medication:  40 mg (1 mL) of Depo-Medrol, 4 mL of 1% lidocaine without epinephrine Preparation: area cleansed with alcohol x 3 Time Out taken  Injection  Landmarks identified Above medication injected using a standard anterior medial approach Patient tolerated well

## 2016-10-28 NOTE — Progress Notes (Signed)
Bilateral ear irrigation.   Successfully removed wax in both ears.  Patient tolerated procedure well.

## 2016-10-28 NOTE — Patient Instructions (Signed)
Follow up as instructed.   Take care  Dr. Lacinda Axon

## 2016-10-28 NOTE — Progress Notes (Signed)
Subjective:  Patient ID: Miguel [name]., male    DOB: [birth year]  Age: 83 y.o. MRN: [id number]  CC: Right knee pain, Cerumen impaction  HPI:  83 year old male with a complicated past medical history presents with the above complaints.  Right knee pain  Patient has long-standing history of OA.  Per his wife, he has been expressing worsening pain as of late.  He's having difficulty ambulating and getting to the dining hall.  Pain is located in the right anterior knee. Worse with activity. No known relieving factors.  Wife states that he's had prior injections with benefit.  They would like him to have an injection today.  Cerumen impaction  Patient just recently got new hearing aids.  He still having difficulty hearing.  At [facility], was found that he has cerumen impaction bilaterally.  He is requesting irrigation today.  Social Hx   Social History   Social History  . Marital status: Married    Spouse name: N/A  . Number of children: N/A  . Years of education: N/A   Social History Main Topics  . Smoking status: Former Smoker    Types: Cigarettes    Quit date: 42  . Smokeless tobacco: Never Used  . Alcohol use No     Comment: quit 2012   . Drug use: No  . Sexual activity: Not Currently    Partners: Female   Other Topics Concern  . None   Social History Narrative  . None    Review of Systems  HENT: Positive for hearing loss.   Musculoskeletal:       R knee pain.   Objective:  BP 100/60   Pulse 84   Temp 98.1 F (36.7 C) (Oral)   Wt 256 lb 9.6 oz (116.4 kg)   SpO2 94%   BMI 34.80 kg/m   BP/Weight 10/28/2016 7/86/7672 0/12/4707  Systolic BP 628 366 294  Diastolic BP 60 60 62  Wt. (Lbs) 256.6 266.75 257.38  BMI 34.8 36.18 35.9   Physical Exam  Constitutional: He is oriented to person, place, and time. He appears well-developed. No distress.  HENT:  Bilateral cerumen impaction.  Pulmonary/Chest: Effort normal.  Musculoskeletal:    Knee: Right Normal to inspection with no erythema or effusion or obvious bony abnormalities.  Palpation with tenderness at the medial joint line. Ligaments with solid consistent endpoints including ACL, PCL, LCL, MCL.   Neurological: He is alert and oriented to person, place, and time.  Psychiatric: He has a normal mood and affect.  Vitals reviewed.  Lab Results  Component Value Date   WBC 4.9 08/06/2016   HGB 12.5 (L) 08/06/2016   HCT 36.6 (L) 08/06/2016   PLT 179 08/06/2016   GLUCOSE 144 (H) 08/06/2016   CHOL 203 (H) 03/18/2016   TRIG 204.0 (H) 03/18/2016   HDL 48.40 03/18/2016   LDLDIRECT 108.0 03/18/2016   ALT 12 (L) 08/06/2016   AST 24 08/06/2016   NA 136 08/06/2016   K 4.3 08/06/2016   CL 102 08/06/2016   CREATININE 1.57 (H) 08/06/2016   BUN 24 (H) 08/06/2016   CO2 27 08/06/2016   TSH 1.986 06/03/2016   INR 1.07 07/18/2016   HGBA1C 5.4 03/18/2016    Assessment & Plan:   Problem List Items Addressed This Visit      Nervous and Auditory   Cerumen impaction    Irrigated successfully today.        Other   Chronic pain of right  knee - Primary    New problem, to me. Patient appears to have underlying osseous arthritis. Joint injection performed today.  Procedure: Right knee injection Consent signed and scanned into record. Medication:  40 mg (1 mL) of Depo-Medrol, 4 mL of 1% lidocaine without epinephrine Preparation: area cleansed with alcohol x 3 Time Out taken  Injection  Landmarks identified Above medication injected using a standard anterior medial approach Patient tolerated well       Relevant Medications   methylPREDNISolone acetate (DEPO-MEDROL) injection 40 mg (Completed)     Meds ordered this encounter  Medications  . methylPREDNISolone acetate (DEPO-MEDROL) injection 40 mg   Follow-up: As scheduled.   [place]

## 2016-10-28 NOTE — Assessment & Plan Note (Signed)
Irrigated successfully today.

## 2016-10-29 ENCOUNTER — Inpatient Hospital Stay: Payer: Medicare Other | Attending: Oncology

## 2016-10-29 ENCOUNTER — Other Ambulatory Visit: Payer: Self-pay | Admitting: Family Medicine

## 2016-10-29 ENCOUNTER — Inpatient Hospital Stay: Payer: Medicare Other | Admitting: *Deleted

## 2016-10-29 ENCOUNTER — Inpatient Hospital Stay (HOSPITAL_BASED_OUTPATIENT_CLINIC_OR_DEPARTMENT_OTHER): Payer: Medicare Other | Admitting: Oncology

## 2016-10-29 VITALS — BP 116/72 | HR 81 | Temp 96.9°F | Resp 18 | Wt 257.1 lb

## 2016-10-29 DIAGNOSIS — N2889 Other specified disorders of kidney and ureter: Secondary | ICD-10-CM

## 2016-10-29 DIAGNOSIS — Z8501 Personal history of malignant neoplasm of esophagus: Secondary | ICD-10-CM

## 2016-10-29 DIAGNOSIS — J449 Chronic obstructive pulmonary disease, unspecified: Secondary | ICD-10-CM | POA: Insufficient documentation

## 2016-10-29 DIAGNOSIS — F419 Anxiety disorder, unspecified: Secondary | ICD-10-CM | POA: Insufficient documentation

## 2016-10-29 DIAGNOSIS — R413 Other amnesia: Secondary | ICD-10-CM | POA: Insufficient documentation

## 2016-10-29 DIAGNOSIS — G629 Polyneuropathy, unspecified: Secondary | ICD-10-CM | POA: Diagnosis not present

## 2016-10-29 DIAGNOSIS — F1020 Alcohol dependence, uncomplicated: Secondary | ICD-10-CM | POA: Diagnosis not present

## 2016-10-29 DIAGNOSIS — Z8041 Family history of malignant neoplasm of ovary: Secondary | ICD-10-CM

## 2016-10-29 DIAGNOSIS — Z923 Personal history of irradiation: Secondary | ICD-10-CM | POA: Insufficient documentation

## 2016-10-29 DIAGNOSIS — Z79899 Other long term (current) drug therapy: Secondary | ICD-10-CM | POA: Insufficient documentation

## 2016-10-29 DIAGNOSIS — K219 Gastro-esophageal reflux disease without esophagitis: Secondary | ICD-10-CM | POA: Diagnosis not present

## 2016-10-29 DIAGNOSIS — Z9221 Personal history of antineoplastic chemotherapy: Secondary | ICD-10-CM | POA: Diagnosis not present

## 2016-10-29 DIAGNOSIS — Z7982 Long term (current) use of aspirin: Secondary | ICD-10-CM | POA: Diagnosis not present

## 2016-10-29 DIAGNOSIS — Z800 Family history of malignant neoplasm of digestive organs: Secondary | ICD-10-CM | POA: Insufficient documentation

## 2016-10-29 DIAGNOSIS — Z87891 Personal history of nicotine dependence: Secondary | ICD-10-CM | POA: Diagnosis not present

## 2016-10-29 DIAGNOSIS — C159 Malignant neoplasm of esophagus, unspecified: Secondary | ICD-10-CM

## 2016-10-29 DIAGNOSIS — E785 Hyperlipidemia, unspecified: Secondary | ICD-10-CM

## 2016-10-29 DIAGNOSIS — H3530 Unspecified macular degeneration: Secondary | ICD-10-CM

## 2016-10-29 DIAGNOSIS — Z7984 Long term (current) use of oral hypoglycemic drugs: Secondary | ICD-10-CM

## 2016-10-29 DIAGNOSIS — C641 Malignant neoplasm of right kidney, except renal pelvis: Secondary | ICD-10-CM | POA: Insufficient documentation

## 2016-10-29 DIAGNOSIS — Z9223 Personal history of estrogen therapy: Secondary | ICD-10-CM

## 2016-10-29 DIAGNOSIS — Z95828 Presence of other vascular implants and grafts: Secondary | ICD-10-CM

## 2016-10-29 LAB — CBC WITH DIFFERENTIAL/PLATELET
Basophils Absolute: 0 10*3/uL (ref 0–0.1)
Basophils Relative: 1 %
Eosinophils Absolute: 0.2 10*3/uL (ref 0–0.7)
Eosinophils Relative: 4 %
HCT: 38.6 % — ABNORMAL LOW (ref 40.0–52.0)
HEMOGLOBIN: 13 g/dL (ref 13.0–18.0)
Lymphocytes Relative: 12 %
Lymphs Abs: 0.7 10*3/uL — ABNORMAL LOW (ref 1.0–3.6)
MCH: 31.5 pg (ref 26.0–34.0)
MCHC: 33.6 g/dL (ref 32.0–36.0)
MCV: 93.6 fL (ref 80.0–100.0)
MONO ABS: 0.5 10*3/uL (ref 0.2–1.0)
MONOS PCT: 8 %
NEUTROS ABS: 4.3 10*3/uL (ref 1.4–6.5)
NEUTROS PCT: 75 %
Platelets: 153 10*3/uL (ref 150–440)
RBC: 4.12 MIL/uL — ABNORMAL LOW (ref 4.40–5.90)
RDW: 16 % — AB (ref 11.5–14.5)
WBC: 5.7 10*3/uL (ref 3.8–10.6)

## 2016-10-29 LAB — COMPREHENSIVE METABOLIC PANEL
ALK PHOS: 114 U/L (ref 38–126)
ALT: 12 U/L — ABNORMAL LOW (ref 17–63)
ANION GAP: 4 — AB (ref 5–15)
AST: 25 U/L (ref 15–41)
Albumin: 3.7 g/dL (ref 3.5–5.0)
BILIRUBIN TOTAL: 0.8 mg/dL (ref 0.3–1.2)
BUN: 20 mg/dL (ref 6–20)
CO2: 30 mmol/L (ref 22–32)
Calcium: 8.6 mg/dL — ABNORMAL LOW (ref 8.9–10.3)
Chloride: 102 mmol/L (ref 101–111)
Creatinine, Ser: 1.62 mg/dL — ABNORMAL HIGH (ref 0.61–1.24)
GFR calc Af Amer: 43 mL/min — ABNORMAL LOW (ref >60)
GFR calc non Af Amer: 37 mL/min — ABNORMAL LOW (ref >60)
GLUCOSE: 104 mg/dL — AB (ref 65–99)
POTASSIUM: 4.4 mmol/L (ref 3.5–5.1)
Sodium: 136 mmol/L (ref 135–145)
TOTAL PROTEIN: 6.9 g/dL (ref 6.5–8.1)

## 2016-10-29 MED ORDER — HEPARIN SOD (PORK) LOCK FLUSH 100 UNIT/ML IV SOLN
500.0000 [IU] | Freq: Once | INTRAVENOUS | Status: AC
  Administered 2016-10-29: 500 [IU] via INTRAVENOUS

## 2016-10-29 MED ORDER — SODIUM CHLORIDE 0.9% FLUSH
10.0000 mL | INTRAVENOUS | Status: DC | PRN
  Administered 2016-10-29: 10 mL via INTRAVENOUS
  Filled 2016-10-29: qty 10

## 2016-10-29 NOTE — Progress Notes (Signed)
[place]  Telephone:([phone] [phone]  ID: Miguel [name]. OB: 1929/08/20  MR#: [id number]  QIH#:[id number]  Patient Care Team: Miguel Spikes, DO as PCP - General (Family Medicine)  CHIEF COMPLAINT: Stage IIIb esophageal cancer, right renal cell carcinoma  INTERVAL HISTORY: Patient returns to clinic today for further evaluation. He currently feels well and is asymptomatic. He denies any dysphagia or difficulty swallowing. He has no neurologic complaints, although admits to worsening memory. He denies any recent fevers or illnesses. He has no chest pain, but continues to have chronic shortness of breath. He denies any nausea, vomiting, constipation, or diarrhea. He has no urinary complaints. Patient offers no specific complaints today.  REVIEW OF SYSTEMS:   Review of Systems  Constitutional: Negative.  Negative for fever, malaise/fatigue and weight loss.  Respiratory: Negative.  Negative for cough and shortness of breath.   Cardiovascular: Negative.  Negative for chest pain and leg swelling.  Gastrointestinal: Negative.  Negative for abdominal pain, heartburn, nausea and vomiting.  Genitourinary: Negative.   Musculoskeletal: Negative.   Skin: Negative.  Negative for rash.  Neurological: Negative.  Negative for sensory change and weakness.  Psychiatric/Behavioral: Positive for memory loss. The patient is not nervous/anxious.     As per HPI. Otherwise, a complete review of systems is negative.  PAST MEDICAL HISTORY: Past Medical History:  Diagnosis Date  . Alcoholism ([place])   . Anxiety   . COPD (chronic obstructive pulmonary disease) ([place])   . Dementia 03/18/2016  . DM type 2 with diabetic peripheral neuropathy ([place]) 03/18/2016  . Dyspnea    slight   . Dysrhythmia    atrial fib   . Esophageal cancer ([place])   . Esophageal stricture   . GERD (gastroesophageal reflux disease) 03/18/2016  . Hyperlipidemia   . Irregular heart rhythm   . Macular  degeneration   . Neuropathy   . Renal mass     PAST SURGICAL HISTORY: Past Surgical History:  Procedure Laterality Date  . BACK SURGERY    . CATARACT EXTRACTION    . CYSTOSCOPY W/ RETROGRADES Right 07/21/2016   Procedure: CYSTOSCOPY WITH RETROGRADE PYELOGRAM;  Surgeon: Hollice Espy, MD;  Location: [facility] ORS;  Service: Urology;  Laterality: Right;  . CYSTOSCOPY WITH STENT PLACEMENT Right 07/21/2016   Procedure: CYSTOSCOPY WITH STENT PLACEMENT;  Surgeon: Hollice Espy, MD;  Location: [facility] ORS;  Service: Urology;  Laterality: Right;  . IR RADIOLOGIST EVAL & MGMT  09/09/2016  . IR RADIOLOGIST EVAL & MGMT  06/19/2016  . KNEE SURGERY    . RADIOLOGY WITH ANESTHESIA N/A 07/23/2016   Procedure: RENAL CRYOABLATION;  Surgeon: Aletta Edouard, MD;  Location: [facility] OR[facility];  Service: Radiology;  Laterality: N/A;    FAMILY HISTORY: Family History  Problem Relation Age of Onset  . Ovarian cancer Sister   . Throat cancer Brother   . Diabetes Mother   . Kidney disease Father   . Prostate cancer Neg Hx   . Kidney cancer Neg Hx     ADVANCED DIRECTIVES (Y/N):  N  HEALTH MAINTENANCE: Social History  Substance Use Topics  . Smoking status: Former Smoker    Types: Cigarettes    Quit date: 63  . Smokeless tobacco: Never Used  . Alcohol use No     Comment: quit 2012      Colonoscopy:  PAP:  Bone density:  Lipid panel:  No Known Allergies  Current Outpatient Prescriptions  Medication Sig Dispense Refill  . ADVAIR DISKUS 250-50 MCG/DOSE  AEPB Inhale 1 puff into the lungs 2 (two) times daily. 180 each 3  . aspirin 81 MG chewable tablet Chew 1 tablet (81 mg total) by mouth daily. 30 tablet 5  . atorvastatin (LIPITOR) 40 MG tablet Take 1 tablet (40 mg total) by mouth daily. 90 tablet 3  . donepezil (ARICEPT) 10 MG tablet Take 1 tablet (10 mg total) by mouth at bedtime. 90 tablet 1  . fesoterodine (TOVIAZ) 8 MG TB24 tablet Take 1 tablet (8 mg total) by mouth daily. 90 tablet 2  . furosemide (LASIX) 20  MG tablet TAKE 1 TABLET DAILY 30 tablet 3  . gabapentin (NEURONTIN) 300 MG capsule Take 1 capsule (300 mg total) by mouth 3 (three) times daily. 270 capsule 1  . glucose blood test strip Use as instructed 100 each 12  . ketoconazole (NIZORAL) 2 % cream Apply 1 application topically daily. 60 g 1  . metFORMIN (GLUCOPHAGE) 1000 MG tablet Take 1,000 mg by mouth 2 (two) times daily with a meal.    . metoprolol tartrate (LOPRESSOR) 25 MG tablet Take 0.5 tablets (12.5 mg total) by mouth 2 (two) times daily. 60 tablet 6  . Multiple Vitamins-Minerals (CENTRUM SILVER PO) Take 1 tablet by mouth daily.    Marland Kitchen omeprazole (PRILOSEC) 20 MG capsule Take 1 capsule (20 mg total) by mouth 2 (two) times daily before a meal. 180 capsule 3  . polyethylene glycol (MIRALAX / GLYCOLAX) packet Take 17 g by mouth daily. 14 each 0  . QUEtiapine (SEROQUEL) 50 MG tablet Take 1 tablet (50 mg total) by mouth at bedtime. 90 tablet 1  . tamsulosin (FLOMAX) 0.4 MG CAPS capsule Take 1 capsule (0.4 mg total) by mouth daily. 30 capsule 0   No current facility-administered medications for this visit.    Facility-Administered Medications Ordered in Other Visits  Medication Dose Route Frequency Provider Last Rate Last Dose  . sodium chloride flush (NS) 0.9 % injection 10 mL  10 mL Intravenous PRN Lloyd Huger, MD   10 mL at 10/29/16 1422    OBJECTIVE: Vitals:   10/29/16 1519  BP: 116/72  Pulse: 81  Resp: 18  Temp: (!) 96.9 F (36.1 C)     Body mass index is 34.87 kg/m.    ECOG FS:0 - Asymptomatic  General: Well-developed, well-nourished, no acute distress. Eyes: Pink conjunctiva, anicteric sclera. HEENT: Normocephalic, moist mucous membranes, clear oropharnyx. Lungs: Clear to auscultation bilaterally. Heart: Regular rate and rhythm. No rubs, murmurs, or gallops. Abdomen: Soft, nontender, nondistended. No organomegaly noted, normoactive bowel sounds. Musculoskeletal: No edema, cyanosis, or clubbing. Neuro: Alert,  answering all questions appropriately. Cranial nerves grossly intact. Skin: No rashes or petechiae noted. Psych: Normal affect.  LAB RESULTS:  Lab Results  Component Value Date   NA 136 10/29/2016   K 4.4 10/29/2016   CL 102 10/29/2016   CO2 30 10/29/2016   GLUCOSE 104 (H) 10/29/2016   BUN 20 10/29/2016   CREATININE 1.62 (H) 10/29/2016   CALCIUM 8.6 (L) 10/29/2016   PROT 6.9 10/29/2016   ALBUMIN 3.7 10/29/2016   AST 25 10/29/2016   ALT 12 (L) 10/29/2016   ALKPHOS 114 10/29/2016   BILITOT 0.8 10/29/2016   GFRNONAA 37 (L) 10/29/2016   GFRAA 43 (L) 10/29/2016    Lab Results  Component Value Date   WBC 5.7 10/29/2016   NEUTROABS 4.3 10/29/2016   HGB 13.0 10/29/2016   HCT 38.6 (L) 10/29/2016   MCV 93.6 10/29/2016   PLT 153 10/29/2016  STUDIES: No results found.  ONCOLOGY HISTORY: Initial diagnosis was in approximately October 2016. This was followed by concurrent chemotherapy with infusional 5-FU and XRT.  Patient received low-dose weekly carboplatinum and Taxol in approximately April and May 2017. He subsequently had a second recurrence and was initiated on Taxol 40 mg/m on days 1, 8, and 15 (Taxol was dose reduced 50% secondary to neuropathy) along with Ramucirumab 8 mg/m on days 1 and 8 starting September 27, 2015 through January 24, 2016. This was a 28 day cycle.   ASSESSMENT: Stage IIIb esophageal cancer, right renal cell carcinoma  PLAN:    1. Stage IIIb esophageal cancer: Patient was noted to be HER-2 negative. See oncology history listed above. CT scan results from June 03, 2016 reviewed independently with no obvious evidence of recurrent or metastatic disease and was essentially unchanged from scans at outside facility dating back to November 19, 2015. Patient has an MRI of his abdomen scheduled for November 03, 2016. He possibly will need additional imaging if the lower third of esophagus is not evident on the scan. Patient also has not been evaluated by GI since  moving to [facility] and a referral was placed. Return to clinic in 4 months with repeat laboratory work and further evaluation. If patient has recurrent disease, would reinitiate dose reduced Taxol and Ramucirumab. 2. Neuropathy: Continue gabapentin and hydrocodone as prescribed. 3. Esophageal stricture: Patient has a history of multiple esophageal dilations. GI referral was above.  4. Right renal cell carcinoma: Patient had cryoablation performed on July 23, 2016. Repeat MRI November 03, 2016. Continue follow-up with interventional radiology as indicated. 5. Renal insufficiency: Patient's creatinine is approximately his baseline, monitor.   Patient expressed understanding and was in agreement with this plan. He also understands that He can call clinic at any time with any questions, concerns, or complaints.   Cancer Staging Squamous cell esophageal cancer [facility]) Staging form: Esophagus - Squamous Cell Carcinoma, AJCC 7th Edition - Clinical stage from 02/27/2016: Stage IIIB (T3, N2, M0) - Signed by Lloyd Huger, MD on 02/27/2016   Lloyd Huger, MD   11/02/2016 2:22 PM

## 2016-10-29 NOTE — Progress Notes (Signed)
Patient denies any concerns today. Patient states he has MRI scheduled for 7/23.

## 2016-10-30 ENCOUNTER — Encounter: Payer: Self-pay | Admitting: Podiatry

## 2016-10-30 ENCOUNTER — Ambulatory Visit (INDEPENDENT_AMBULATORY_CARE_PROVIDER_SITE_OTHER): Payer: Medicare Other | Admitting: Podiatry

## 2016-10-30 DIAGNOSIS — M79676 Pain in unspecified toe(s): Secondary | ICD-10-CM | POA: Diagnosis not present

## 2016-10-30 DIAGNOSIS — B351 Tinea unguium: Secondary | ICD-10-CM | POA: Diagnosis not present

## 2016-10-30 DIAGNOSIS — E1142 Type 2 diabetes mellitus with diabetic polyneuropathy: Secondary | ICD-10-CM

## 2016-10-30 NOTE — Progress Notes (Signed)
Complaint:  Visit Type: Patient returns to my office for continued preventative foot care services. Complaint: Patient states" my nails have grown long and thick and become painful to walk and wear shoes" Patient has been diagnosed with DM with neuropathy.. The patient presents for preventative foot care services. No changes to ROS  Podiatric Exam: Vascular: dorsalis pedis and posterior tibial pulses are palpable bilateral. Capillary return is immediate. Temperature gradient is WNL. Skin turgor WNL  Sensorium: Normal Semmes Weinstein monofilament test. Normal tactile sensation bilaterally. Nail Exam: Pt has thick disfigured discolored nails with subungual debris noted bilateral entire nail hallux through fifth toenails Ulcer Exam: There is no evidence of ulcer or pre-ulcerative changes or infection. Orthopedic Exam: Muscle tone and strength are WNL. No limitations in general ROM. No crepitus or effusions noted. Foot type and digits show no abnormalities. Bony prominences are unremarkable. Skin: No Porokeratosis. No infection or ulcers  Diagnosis:  Onychomycosis, , Pain in right toe, pain in left toes  Treatment & Plan Procedures and Treatment: Consent by patient was obtained for treatment procedures. The patient understood the discussion of treatment and procedures well. All questions were answered thoroughly reviewed. Debridement of mycotic and hypertrophic toenails, 1 through 5 bilateral and clearing of subungual debris. No ulceration, no infection noted.  Return Visit-Office Procedure: Patient instructed to return to the office for a follow up visit 3 months for continued evaluation and treatment.    Osie Merkin DPM 

## 2016-11-03 ENCOUNTER — Ambulatory Visit: Payer: Medicare Other | Admitting: Family Medicine

## 2016-11-03 ENCOUNTER — Ambulatory Visit
Admission: RE | Admit: 2016-11-03 | Discharge: 2016-11-03 | Disposition: A | Discharge status: Home / Self Care | Payer: Medicare Other | Source: Ambulatory Visit | Attending: Interventional Radiology | Admitting: Interventional Radiology

## 2016-11-03 DIAGNOSIS — N289 Disorder of kidney and ureter, unspecified: Secondary | ICD-10-CM | POA: Insufficient documentation

## 2016-11-03 DIAGNOSIS — C641 Malignant neoplasm of right kidney, except renal pelvis: Secondary | ICD-10-CM | POA: Diagnosis present

## 2016-11-03 DIAGNOSIS — R590 Localized enlarged lymph nodes: Secondary | ICD-10-CM | POA: Diagnosis not present

## 2016-11-03 DIAGNOSIS — K769 Liver disease, unspecified: Secondary | ICD-10-CM | POA: Insufficient documentation

## 2016-11-03 DIAGNOSIS — K8020 Calculus of gallbladder without cholecystitis without obstruction: Secondary | ICD-10-CM | POA: Diagnosis not present

## 2016-11-03 MED ORDER — GADOBENATE DIMEGLUMINE 529 MG/ML IV SOLN
10.0000 mL | Freq: Once | INTRAVENOUS | Status: AC | PRN
  Administered 2016-11-03: 10 mL via INTRAVENOUS

## 2016-11-05 ENCOUNTER — Other Ambulatory Visit: Payer: Self-pay | Admitting: Family Medicine

## 2016-11-05 NOTE — Telephone Encounter (Signed)
Pt son called wanting to know why the medication of QUEtiapine (SEROQUEL) 50 MG tablet was rejected for refill. Son would like to have it refilled if possible not sure why it was stopped. 90 day supply.  Pharmacy is [place], [place]  Call son @ [phone]. Thank you!

## 2016-11-05 NOTE — Telephone Encounter (Signed)
Spoke with Miguel [name] , Debbie last filled 06/17/16 they want to allow for shipment to patient  For 2 weeks to patient .

## 2016-11-06 ENCOUNTER — Other Ambulatory Visit: Payer: Self-pay | Admitting: Family Medicine

## 2016-11-06 MED ORDER — QUETIAPINE FUMARATE 50 MG PO TABS: 50.0000 mg | ORAL_TABLET | Freq: Every day | ORAL | 1 refills | Status: AC

## 2016-11-12 ENCOUNTER — Other Ambulatory Visit: Payer: Self-pay | Admitting: Interventional Radiology

## 2016-11-12 ENCOUNTER — Ambulatory Visit
Admission: RE | Admit: 2016-11-12 | Discharge: 2016-11-12 | Disposition: A | Discharge status: Home / Self Care | Payer: Medicare Other | Source: Ambulatory Visit | Attending: Interventional Radiology | Admitting: Interventional Radiology

## 2016-11-12 DIAGNOSIS — C159 Malignant neoplasm of esophagus, unspecified: Secondary | ICD-10-CM

## 2016-11-12 DIAGNOSIS — C641 Malignant neoplasm of right kidney, except renal pelvis: Secondary | ICD-10-CM

## 2016-11-12 HISTORY — PX: IR RADIOLOGIST EVAL & MGMT: IMG5224

## 2016-11-12 NOTE — Progress Notes (Signed)
Chief Complaint: Status post cryoablation of a right clear cell renal carcinoma on 07/23/2016.  History of Present Illness: Miguel [name]. is an 83 y.o. 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 He tolerated the procedure extremely well and has had no complaints or evidence of complication. Renal function has been stable with stable renal insufficiency and a stable GFR of 37 mL per minute following ablation. He returns to review a follow-up MRI that was performed with and without a half dose of gadolinium on 11/03/2016. His only complaint today is of frequent urination for which he is being treated by Dr. Erlene Quan. He is scheduled to see Dr. Allen Norris soon for EGD and possible esophageal dilatation for recurrent stricture at the level of treated squamous carcinoma of the esophagus. He currently denies any dysphagia symptoms. He recently had a follow-up visit with Dr. Grayland Ormond on 10/29/2016.   Past Medical History:  Diagnosis Date  . Alcoholism ([place])   . Anxiety   . COPD (chronic obstructive pulmonary disease) ([place])   . Dementia 03/18/2016  . DM type 2 with diabetic peripheral neuropathy ([place]) 03/18/2016  . Dyspnea    slight   . Dysrhythmia    atrial fib   . Esophageal cancer ([place])   . Esophageal stricture   . GERD (gastroesophageal reflux disease) 03/18/2016  . Hyperlipidemia   . Irregular heart rhythm   . Macular degeneration   . Neuropathy   . Renal mass     Past Surgical History:  Procedure Laterality Date  . BACK SURGERY    . CATARACT EXTRACTION    . CYSTOSCOPY W/ RETROGRADES Right 07/21/2016   Procedure: CYSTOSCOPY WITH RETROGRADE PYELOGRAM;  Surgeon: Hollice Espy, MD;  Location: [facility] ORS;  Service: Urology;  Laterality: Right;  . CYSTOSCOPY WITH STENT PLACEMENT Right 07/21/2016   Procedure: CYSTOSCOPY WITH STENT PLACEMENT;  Surgeon: Hollice Espy, MD;  Location: [facility];  Service: Urology;   Laterality: Right;  . IR RADIOLOGIST EVAL & MGMT  09/09/2016  . IR RADIOLOGIST EVAL & MGMT  06/19/2016  . KNEE SURGERY    . RADIOLOGY WITH ANESTHESIA N/A 07/23/2016   Procedure: RENAL CRYOABLATION;  Surgeon: Aletta Edouard, MD;  Location: [facility];  Service: Radiology;  Laterality: N/A;    Allergies: Patient has no known allergies.  Medications: Prior to Admission medications   Medication Sig Start Date End Date Taking? Authorizing Provider  ADVAIR DISKUS 250-50 MCG/DOSE AEPB Inhale 1 puff into the lungs 2 (two) times daily. 10/27/16   Coral Spikes, DO  aspirin 81 MG chewable tablet Chew 1 tablet (81 mg total) by mouth daily. 06/07/16   Theodoro Grist, MD  atorvastatin (LIPITOR) 40 MG tablet Take 1 tablet (40 mg total) by mouth daily. 03/20/16   Coral Spikes, DO  donepezil (ARICEPT) 10 MG tablet TAKE 1 TABLET AT BEDTIME 11/06/16   Coral Spikes, DO  fesoterodine (TOVIAZ) 8 MG TB24 tablet Take 1 tablet (8 mg total) by mouth daily. 08/27/16   Coral Spikes, DO  furosemide (LASIX) 20 MG tablet TAKE 1 TABLET DAILY 10/20/16   Thersa Salt G, DO  gabapentin (NEURONTIN) 300 MG capsule Take 1 capsule (300 mg total) by mouth 3 (three) times daily. 09/16/16   Thersa Salt G, DO  glucose blood test strip Use as instructed 04/17/16   Coral Spikes, DO  ketoconazole (NIZORAL) 2 % cream Apply 1 application topically daily. 03/25/16  Cook, Jayce G, DO  metFORMIN (GLUCOPHAGE) 1000 MG tablet Take 1,000 mg by mouth 2 (two) times daily with a meal.    [provider]  metoprolol tartrate (LOPRESSOR) 25 MG tablet Take 0.5 tablets (12.5 mg total) by mouth 2 (two) times daily. 06/06/16   Theodoro Grist, MD  Multiple Vitamins-Minerals (CENTRUM SILVER PO) Take 1 tablet by mouth daily.    [provider]  omeprazole (PRILOSEC) 20 MG capsule Take 1 capsule (20 mg total) by mouth 2 (two) times daily before a meal. 03/18/16   Cook, [place] G, DO  polyethylene glycol (MIRALAX / GLYCOLAX) packet Take 17 g by mouth daily.  06/07/16   Theodoro Grist, MD  QUEtiapine (SEROQUEL) 50 MG tablet Take 1 tablet (50 mg total) by mouth at bedtime. 11/06/16   Coral Spikes, DO  tamsulosin (FLOMAX) 0.4 MG CAPS capsule Take 1 capsule (0.4 mg total) by mouth daily. 07/21/16   Hollice Espy, MD     Family History  Problem Relation Age of Onset  . Ovarian cancer Sister   . Throat cancer Brother   . Diabetes Mother   . Kidney disease Father   . Prostate cancer Neg Hx   . Kidney cancer Neg Hx     Social History   Social History  . Marital status: Married    Spouse name: N/A  . Number of children: N/A  . Years of education: N/A   Social History Main Topics  . Smoking status: Former Smoker    Types: Cigarettes    Quit date: 80  . Smokeless tobacco: Never Used  . Alcohol use No     Comment: quit 2012   . Drug use: No  . Sexual activity: Not Currently    Partners: Female   Other Topics Concern  . Not on file   Social History Narrative  . No narrative on file    ECOG Status: 0 - Asymptomatic  Review of Systems: A 12 point ROS discussed and pertinent positives are indicated in the HPI above.  All other systems are negative.  Review of Systems  Constitutional: Negative.   HENT: Negative.   Respiratory: Negative.   Cardiovascular: Negative.   Gastrointestinal: Negative.   Genitourinary: Positive for frequency. Negative for difficulty urinating, dysuria, enuresis, flank pain and hematuria.  Musculoskeletal: Negative.   Neurological: Negative.     Vital Signs: BP (!) 92/58   Pulse 82   Temp 97.6 F (36.4 C) (Oral)   Resp 15   Ht 5' 10"  (1.778 m)   Wt 257 lb (116.6 kg)   SpO2 95%   BMI 36.88 kg/m   Physical Exam  Constitutional: He is oriented to person, place, and time. No distress.  Neurological: He is alert and oriented to person, place, and time.  Skin: He is not diaphoretic.  Vitals reviewed.   Imaging: Mr Abdomen Wwo Contrast  Result Date: 11/03/2016 CLINICAL DATA:  83 year old  male with history of right renal mass status post percutaneous core biopsy and cryoablation on 07/23/2016. Currently asymptomatic. Additional history of esophageal cancer. EXAM: MRI ABDOMEN WITHOUT AND WITH CONTRAST TECHNIQUE: Multiplanar multisequence MR imaging of the abdomen was performed both before and after the administration of intravenous contrast. CONTRAST:  10 mL of MultiHance. COMPARISON:  MRI abdomen 07/01/2016. FINDINGS: Lower chest: Unremarkable. Hepatobiliary: In the inferior aspect of segment 6 of the liver (axial image 11 of series 19) there is a new area measuring approximately 1.5 cm which demonstrates slight T1 hypointensity, slight T2  hyperintensity, and appears initially hypovascular with progressive enhancement on more delayed images. In segment 4B of the liver (axial image 8 of series 10) there is a subtle lesion which is T1 hypointense, T2 hyperintense, and demonstrates enhancement, which also demonstrates diffusion restriction (axial image 6 of series 6 and image 6 of series 7) concerning for metastatic lesion. No intra or extrahepatic biliary ductal dilatation. Numerous small filling defects are noted within the gallbladder, compatible with gallstones. No findings to suggest an acute cholecystitis at this time. Pancreas: No pancreatic mass. No pancreatic ductal dilatation. No pancreatic or peripancreatic fluid or inflammatory changes. Spleen:  Unremarkable. Adrenals/Urinary Tract: In the lower pole of the right kidney at the site of the previously treated lesion there is an ovoid shaped region of heterogeneous signal intensity on T1 and T2 weighted images which does not enhance, compatible with a post ablation defect. Several other well-defined lesions are noted in both kidneys which demonstrate T1 hypointensity, T2 hyperintensity, and do not enhance, compatible with simple cysts. In addition, in the posterior aspect of the interpolar region of the left kidney there is a 1.6 cm lesion  which is slightly T1 hyperintense, T2 hyperintense, and does not enhance, compatible with a mildly proteinaceous cyst. An additional lesion in the posterior aspect of the lower pole of the left kidney also demonstrates a small focus of T1 hyperintensity (axial image 42 of series 9), which is unchanged, but this lesion otherwise demonstrates no enhancement or other suspicious characteristics, and is similar in size measuring 1.3 cm. Stomach/Bowel: Visualized portions are unremarkable. Vascular/Lymphatic: Aortic atherosclerosis without definite aneurysm in the abdominal vasculature. Two renal arteries are noted bilaterally. Right renal vein is widely patent. Multiple prominent upper abdominal and retroperitoneal lymph nodes are noted, largest of which include a 10 mm short axis portacaval lymph node (axial image 14 of series 14), and a 10 mm short axis right para-aortic lymph node (axial image 20 of series 14). Both of these lymph nodes do appear to demonstrate some mild diffusion restriction. Other: No significant volume of ascites noted in the visualized portions of the peritoneal cavity. Musculoskeletal: No aggressive appearing osseous lesions are noted in the visualized portions of the skeleton. IMPRESSION: 1. Postprocedural changes of ablation in the lower pole of the right kidney, as above. No findings to suggest residual or locally recurrent disease. 2. Two new liver lesions in segment 4B and segment 6, as above, are concerning for metastatic lesions. 3. Borderline enlarged portacaval lymph node and mildly enlarged right para-aortic lymph node both of which are larger than the prior examinations and demonstrate mild diffusion restriction, concerning for possible metastatic nodes. Close attention on followup studies is recommended. 4. Multiple other renal lesions bilaterally are compatible with Bosniak class 1 and Bosniak class 2 cyst. 5. Cholelithiasis without evidence of acute cholecystitis at this time. These  results were called by telephone at the time of interpretation on 11/03/2016 at 9:53 am to Dr. Aletta Edouard, who verbally acknowledged these results. Electronically Signed   By: Vinnie Langton M.D.   On: 11/03/2016 09:53    Labs:  CBC:  Recent Labs  07/18/16 0930 07/24/16 0430 08/06/16 1350 10/29/16 1404  WBC 5.3 7.7 4.9 5.7  HGB 13.9 12.8* 12.5* 13.0  HCT 43.1 39.2 36.6* 38.6*  PLT 139* 126* 179 153    COAGS:  Recent Labs  06/03/16 1027 07/18/16 0930  INR 1.24 1.07  APTT 33 32    BMP:  Recent Labs  07/18/16 0930 07/24/16 0430  08/06/16 1350 10/29/16 1404  NA 139 138 136 136  K 5.1 5.0 4.3 4.4  CL 100* 102 102 102  CO2 31 28 27 30   GLUCOSE 106* 139* 144* 104*  BUN 23* 30* 24* 20  CALCIUM 9.1 8.8* 8.8* 8.6*  CREATININE 1.62* 1.72* 1.57* 1.62*  GFRNONAA 37* 35* 39* 37*  GFRAA 43* 40* 45* 43*    LIVER FUNCTION TESTS:  Recent Labs  05/12/16 1003 06/03/16 0837 08/06/16 1350 10/29/16 1404  BILITOT 0.7 1.0 1.0 0.8  AST 28 28 24 25   ALT 17 18 12* 12*  ALKPHOS 91 116 115 114  PROT 6.7 6.8 7.0 6.9  ALBUMIN 3.7 3.7 3.7 3.7    TUMOR MARKERS:  Recent Labs  05/12/16 1003  CEA 1.4  CA199 7    Assessment and Plan:  I met with Miguel [name], his wife and son. We reviewed the MRI from 11/03/2016 and compared it to the abdominal MRI on 07/09/2016. The right lower pole renal ablation zone encompasses the previously enhancing carcinoma and there is no evidence of current enhancement consistent with a completely ablated tumor. There is no evidence of hydronephrosis following the procedure and no new suspicious renal lesions are identified bilaterally.  The study does demonstrate 2 new enhancing lesions in the liver, however, as well as more prominent borderline portacaval and right para-aortic retroperitoneal lymph nodes. A lesion in segment 4B of the liver measures approximately 10 mm and a lesion in the inferior aspect of segment 6 measures approximately 15  mm. Both appear new and are suspicious for metastatic lesions. 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  We discussed options for further management including biopsy of the liver as well as further surveillance. The lymph nodes are not in locations amenable to percutaneous biopsy. The larger liver lesion is in the inferior aspect of the right lobe and if visible by ultrasound, should be amenable to percutaneous biopsy.  Miguel [name] would potentially be open to receiving more chemotherapy should he have proven metastatic disease. Biopsy would be needed to definitively prove that this is metastatic esophageal carcinoma. After discussion about biopsy and risks, he would like to proceed with an ultrasound-guided liver biopsy. If the lesions are occult by ultrasound, I recommended short-term surveillance MRI as I doubt that they would be visible by unenhanced CT and would prefer not to give him iodinated contrast given his chronic kidney disease.  I have tentatively scheduled Miguel [name] for an ultrasound-guided liver biopsy at [facility] on August 15.  I may also be able to perform it on August 13.  Electronically SignedAletta Edouard T 11/12/2016, 9:23 AM     I spent a total of 15 Minutes in face to face in clinical consultation, greater than 50% of which was counseling/coordinating care post ablation of a right renal carcinoma and for biopsy of a liver lesion.

## 2016-11-21 ENCOUNTER — Other Ambulatory Visit: Payer: Self-pay | Admitting: Radiology

## 2016-11-21 ENCOUNTER — Other Ambulatory Visit: Payer: Self-pay | Admitting: Physician Assistant

## 2016-11-24 ENCOUNTER — Ambulatory Visit
Admission: RE | Admit: 2016-11-24 | Discharge: 2016-11-24 | Disposition: A | Discharge status: Home / Self Care | Payer: Medicare Other | Source: Ambulatory Visit | Attending: Interventional Radiology | Admitting: Interventional Radiology

## 2016-11-24 DIAGNOSIS — Z7982 Long term (current) use of aspirin: Secondary | ICD-10-CM | POA: Insufficient documentation

## 2016-11-24 DIAGNOSIS — Z841 Family history of disorders of kidney and ureter: Secondary | ICD-10-CM | POA: Insufficient documentation

## 2016-11-24 DIAGNOSIS — Z87891 Personal history of nicotine dependence: Secondary | ICD-10-CM | POA: Diagnosis not present

## 2016-11-24 DIAGNOSIS — E785 Hyperlipidemia, unspecified: Secondary | ICD-10-CM | POA: Insufficient documentation

## 2016-11-24 DIAGNOSIS — C787 Secondary malignant neoplasm of liver and intrahepatic bile duct: Secondary | ICD-10-CM | POA: Diagnosis not present

## 2016-11-24 DIAGNOSIS — F419 Anxiety disorder, unspecified: Secondary | ICD-10-CM | POA: Diagnosis not present

## 2016-11-24 DIAGNOSIS — H3530 Unspecified macular degeneration: Secondary | ICD-10-CM | POA: Insufficient documentation

## 2016-11-24 DIAGNOSIS — I4891 Unspecified atrial fibrillation: Secondary | ICD-10-CM | POA: Diagnosis not present

## 2016-11-24 DIAGNOSIS — Z9889 Other specified postprocedural states: Secondary | ICD-10-CM | POA: Diagnosis not present

## 2016-11-24 DIAGNOSIS — J449 Chronic obstructive pulmonary disease, unspecified: Secondary | ICD-10-CM | POA: Insufficient documentation

## 2016-11-24 DIAGNOSIS — K219 Gastro-esophageal reflux disease without esophagitis: Secondary | ICD-10-CM | POA: Diagnosis not present

## 2016-11-24 DIAGNOSIS — Z85528 Personal history of other malignant neoplasm of kidney: Secondary | ICD-10-CM | POA: Insufficient documentation

## 2016-11-24 DIAGNOSIS — F1020 Alcohol dependence, uncomplicated: Secondary | ICD-10-CM | POA: Insufficient documentation

## 2016-11-24 DIAGNOSIS — Z7984 Long term (current) use of oral hypoglycemic drugs: Secondary | ICD-10-CM | POA: Diagnosis not present

## 2016-11-24 DIAGNOSIS — Z8041 Family history of malignant neoplasm of ovary: Secondary | ICD-10-CM | POA: Diagnosis not present

## 2016-11-24 DIAGNOSIS — F0390 Unspecified dementia without behavioral disturbance: Secondary | ICD-10-CM | POA: Insufficient documentation

## 2016-11-24 DIAGNOSIS — Z8501 Personal history of malignant neoplasm of esophagus: Secondary | ICD-10-CM | POA: Insufficient documentation

## 2016-11-24 DIAGNOSIS — C159 Malignant neoplasm of esophagus, unspecified: Secondary | ICD-10-CM | POA: Diagnosis present

## 2016-11-24 DIAGNOSIS — Z833 Family history of diabetes mellitus: Secondary | ICD-10-CM | POA: Insufficient documentation

## 2016-11-24 DIAGNOSIS — Z800 Family history of malignant neoplasm of digestive organs: Secondary | ICD-10-CM | POA: Insufficient documentation

## 2016-11-24 DIAGNOSIS — Z79899 Other long term (current) drug therapy: Secondary | ICD-10-CM | POA: Diagnosis not present

## 2016-11-24 HISTORY — DX: Malignant neoplasm of unspecified kidney, except renal pelvis: C64.9

## 2016-11-24 LAB — CBC
HCT: 42.9 % (ref 40.0–52.0)
Hemoglobin: 14.1 g/dL (ref 13.0–18.0)
MCH: 31.5 pg (ref 26.0–34.0)
MCHC: 32.9 g/dL (ref 32.0–36.0)
MCV: 95.7 fL (ref 80.0–100.0)
PLATELETS: 129 10*3/uL — AB (ref 150–440)
RBC: 4.48 MIL/uL (ref 4.40–5.90)
RDW: 16.1 % — AB (ref 11.5–14.5)
WBC: 5.8 10*3/uL (ref 3.8–10.6)

## 2016-11-24 LAB — PROTIME-INR
INR: 1.06
PROTHROMBIN TIME: 13.8 s (ref 11.4–15.2)

## 2016-11-24 LAB — GLUCOSE, CAPILLARY: Glucose-Capillary: 82 mg/dL (ref 65–99)

## 2016-11-24 LAB — APTT: APTT: 32 s (ref 24–36)

## 2016-11-24 MED ORDER — SODIUM CHLORIDE 0.9 % IV SOLN
INTRAVENOUS | Status: DC
  Administered 2016-11-24: 09:00:00 via INTRAVENOUS

## 2016-11-24 MED ORDER — FENTANYL CITRATE (PF) 100 MCG/2ML IJ SOLN
INTRAMUSCULAR | Status: AC
  Filled 2016-11-24: qty 2

## 2016-11-24 MED ORDER — MIDAZOLAM HCL 2 MG/2ML IJ SOLN
INTRAMUSCULAR | Status: AC
  Filled 2016-11-24: qty 4

## 2016-11-24 MED ORDER — MIDAZOLAM HCL 2 MG/2ML IJ SOLN
INTRAMUSCULAR | Status: AC | PRN
  Administered 2016-11-24 (×2): 1 mg via INTRAVENOUS

## 2016-11-24 MED ORDER — FENTANYL CITRATE (PF) 100 MCG/2ML IJ SOLN
INTRAMUSCULAR | Status: AC | PRN
  Administered 2016-11-24 (×2): 50 ug via INTRAVENOUS

## 2016-11-24 NOTE — H&P (Signed)
Chief Complaint: Patient was seen in consultation today for liver lesions at the request of Yamagata,Glenn  Referring Physician(s): Yamagata,Glenn  Patient Status: [facility] - Out-pt  History of Present Illness: Miguel [name]. is a 83 y.o. 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  On surveillance MRI he was noted to have two new small liver lesions, 10 mm in segment 4a and 15 mm in segment 6.  This would be an atypical pattern of spread for [facility] but could be delayed mets from his esophageal cancer.    He would be a candidate for additional therapy if these lesions prove to be metastatic.  He presents today for attempted US guided biopsy of one of the liver lesions. He is in his usual state of health.  No active complaints.   Past Medical History:  Diagnosis Date  . Alcoholism ([place])   . Anxiety   . COPD (chronic obstructive pulmonary disease) ([place])   . Dementia 03/18/2016  . DM type 2 with diabetic peripheral neuropathy ([place]) 03/18/2016  . Dyspnea    slight   . Dysrhythmia    atrial fib   . Esophageal cancer ([place])   . Esophageal stricture   . GERD (gastroesophageal reflux disease) 03/18/2016  . Hyperlipidemia   . Irregular heart rhythm   . Kidney carcinoma ([place])    right, sp ablation  . Macular degeneration   . Neuropathy   . Renal mass     Past Surgical History:  Procedure Laterality Date  . BACK SURGERY    . CATARACT EXTRACTION    . CYSTOSCOPY W/ RETROGRADES Right 07/21/2016   Procedure: CYSTOSCOPY WITH RETROGRADE PYELOGRAM;  Surgeon: Hollice Espy, MD;  Location: [facility] ORS;  Service: Urology;  Laterality: Right;  . CYSTOSCOPY WITH STENT PLACEMENT Right 07/21/2016   Procedure: CYSTOSCOPY WITH STENT PLACEMENT;  Surgeon: Hollice Espy, MD;  Location: [facility] ORS;  Service: Urology;  Laterality: Right;  .  IR RADIOLOGIST EVAL & MGMT  09/09/2016  . IR RADIOLOGIST EVAL & MGMT  06/19/2016  . KNEE SURGERY    . RADIOLOGY WITH ANESTHESIA N/A 07/23/2016   Procedure: RENAL CRYOABLATION;  Surgeon: Aletta Edouard, MD;  Location: [facility] ORS;  Service: Radiology;  Laterality: N/A;    Allergies: Patient has no known allergies.  Medications: Prior to Admission medications   Medication Sig Start Date End Date Taking? Authorizing Provider  ADVAIR DISKUS 250-50 MCG/DOSE AEPB Inhale 1 puff into the lungs 2 (two) times daily. 10/27/16  Yes Coral Spikes, DO  aspirin 81 MG chewable tablet Chew 1 tablet (81 mg total) by mouth daily. 06/07/16  Yes Theodoro Grist, MD  atorvastatin (LIPITOR) 40 MG tablet Take 1 tablet (40 mg total) by mouth daily. 03/20/16  Yes Cook, Jayce G, DO  donepezil (ARICEPT) 10 MG tablet TAKE 1 TABLET AT BEDTIME 11/06/16  Yes Cook, Jayce G, DO  fesoterodine (TOVIAZ) 8 MG TB24 tablet Take 1 tablet (8 mg total) by mouth daily. 08/27/16  Yes Cook, Jayce G, DO  furosemide (LASIX) 20 MG tablet TAKE 1 TABLET DAILY 10/20/16  Yes Cook, Jayce G, DO  gabapentin (NEURONTIN) 300 MG capsule Take 1 capsule (300 mg total) by mouth 3 (three) times daily. 09/16/16  Yes Cook, Jayce G, DO  metFORMIN (GLUCOPHAGE) 1000 MG tablet Take 1,000 mg by mouth 2 (two) times daily  with a meal.   Yes [provider]  metoprolol tartrate (LOPRESSOR) 25 MG tablet Take 0.5 tablets (12.5 mg total) by mouth 2 (two) times daily. 06/06/16  Yes Theodoro Grist, MD  Multiple Vitamins-Minerals (CENTRUM SILVER PO) Take 1 tablet by mouth daily.   Yes [provider]  omeprazole (PRILOSEC) 20 MG capsule Take 1 capsule (20 mg total) by mouth 2 (two) times daily before a meal. 03/18/16  Yes Cook, Jayce G, DO  polyethylene glycol (MIRALAX / GLYCOLAX) packet Take 17 g by mouth daily. 06/07/16  Yes Theodoro Grist, MD  QUEtiapine (SEROQUEL) 50 MG tablet Take 1 tablet (50 mg total) by mouth at bedtime. 11/06/16  Yes Cook, Jayce G, DO  tamsulosin  (FLOMAX) 0.4 MG CAPS capsule Take 1 capsule (0.4 mg total) by mouth daily. 07/21/16  Yes Hollice Espy, MD  glucose blood test strip Use as instructed 04/17/16   Coral Spikes, DO  ketoconazole (NIZORAL) 2 % cream Apply 1 application topically daily. 03/25/16   Coral Spikes, DO     Family History  Problem Relation Age of Onset  . Ovarian cancer Sister   . Throat cancer Brother   . Diabetes Mother   . Kidney disease Father   . Prostate cancer Neg Hx   . Kidney cancer Neg Hx     Social History   Social History  . Marital status: Married    Spouse name: N/A  . Number of children: N/A  . Years of education: N/A   Social History Main Topics  . Smoking status: Former Smoker    Types: Cigarettes    Quit date: 56  . Smokeless tobacco: Never Used  . Alcohol use No     Comment: quit 2012   . Drug use: No  . Sexual activity: Not Currently    Partners: Female   Other Topics Concern  . None   Social History Narrative  . None    ECOG Status: 0 - Asymptomatic  Review of Systems: A 12 point ROS discussed and pertinent positives are indicated in the HPI above.  All other systems are negative.  Review of Systems  Vital Signs: BP 120/68   Pulse 89   Resp 16   SpO2 97%   Physical Exam  Mallampati Score:  MD Evaluation Airway: WNL Heart: WNL Abdomen: WNL Chest/ Lungs: WNL ASA  Classification: 2 Mallampati/Airway Score: Two  Imaging: Mr Abdomen Wwo Contrast  Result Date: 11/03/2016 CLINICAL DATA:  83 year old male with history of right renal mass status post percutaneous core biopsy and cryoablation on 07/23/2016. Currently asymptomatic. Additional history of esophageal cancer. EXAM: MRI ABDOMEN WITHOUT AND WITH CONTRAST TECHNIQUE: Multiplanar multisequence MR imaging of the abdomen was performed both before and after the administration of intravenous contrast. CONTRAST:  10 mL of MultiHance. COMPARISON:  MRI abdomen 07/01/2016. FINDINGS: Lower chest: Unremarkable.  Hepatobiliary: In the inferior aspect of segment 6 of the liver (axial image 11 of series 19) there is a new area measuring approximately 1.5 cm which demonstrates slight T1 hypointensity, slight T2 hyperintensity, and appears initially hypovascular with progressive enhancement on more delayed images. In segment 4B of the liver (axial image 8 of series 10) there is a subtle lesion which is T1 hypointense, T2 hyperintense, and demonstrates enhancement, which also demonstrates diffusion restriction (axial image 6 of series 6 and image 6 of series 7) concerning for metastatic lesion. No intra or extrahepatic biliary ductal dilatation. Numerous small filling defects are noted within the gallbladder, compatible  with gallstones. No findings to suggest an acute cholecystitis at this time. Pancreas: No pancreatic mass. No pancreatic ductal dilatation. No pancreatic or peripancreatic fluid or inflammatory changes. Spleen:  Unremarkable. Adrenals/Urinary Tract: In the lower pole of the right kidney at the site of the previously treated lesion there is an ovoid shaped region of heterogeneous signal intensity on T1 and T2 weighted images which does not enhance, compatible with a post ablation defect. Several other well-defined lesions are noted in both kidneys which demonstrate T1 hypointensity, T2 hyperintensity, and do not enhance, compatible with simple cysts. In addition, in the posterior aspect of the interpolar region of the left kidney there is a 1.6 cm lesion which is slightly T1 hyperintense, T2 hyperintense, and does not enhance, compatible with a mildly proteinaceous cyst. An additional lesion in the posterior aspect of the lower pole of the left kidney also demonstrates a small focus of T1 hyperintensity (axial image 42 of series 9), which is unchanged, but this lesion otherwise demonstrates no enhancement or other suspicious characteristics, and is similar in size measuring 1.3 cm. Stomach/Bowel: Visualized  portions are unremarkable. Vascular/Lymphatic: Aortic atherosclerosis without definite aneurysm in the abdominal vasculature. Two renal arteries are noted bilaterally. Right renal vein is widely patent. Multiple prominent upper abdominal and retroperitoneal lymph nodes are noted, largest of which include a 10 mm short axis portacaval lymph node (axial image 14 of series 14), and a 10 mm short axis right para-aortic lymph node (axial image 20 of series 14). Both of these lymph nodes do appear to demonstrate some mild diffusion restriction. Other: No significant volume of ascites noted in the visualized portions of the peritoneal cavity. Musculoskeletal: No aggressive appearing osseous lesions are noted in the visualized portions of the skeleton. IMPRESSION: 1. Postprocedural changes of ablation in the lower pole of the right kidney, as above. No findings to suggest residual or locally recurrent disease. 2. Two new liver lesions in segment 4B and segment 6, as above, are concerning for metastatic lesions. 3. Borderline enlarged portacaval lymph node and mildly enlarged right para-aortic lymph node both of which are larger than the prior examinations and demonstrate mild diffusion restriction, concerning for possible metastatic nodes. Close attention on followup studies is recommended. 4. Multiple other renal lesions bilaterally are compatible with Bosniak class 1 and Bosniak class 2 cyst. 5. Cholelithiasis without evidence of acute cholecystitis at this time. These results were called by telephone at the time of interpretation on 11/03/2016 at 9:53 am to Dr. Aletta Edouard, who verbally acknowledged these results. Electronically Signed   By: Vinnie Langton M.D.   On: 11/03/2016 09:53    Labs:  CBC:  Recent Labs  07/18/16 0930 07/24/16 0430 08/06/16 1350 10/29/16 1404  WBC 5.3 7.7 4.9 5.7  HGB 13.9 12.8* 12.5* 13.0  HCT 43.1 39.2 36.6* 38.6*  PLT 139* 126* 179 153    COAGS:  Recent Labs   06/03/16 1027 07/18/16 0930  INR 1.24 1.07  APTT 33 32    BMP:  Recent Labs  07/18/16 0930 07/24/16 0430 08/06/16 1350 10/29/16 1404  NA 139 138 136 136  K 5.1 5.0 4.3 4.4  CL 100* 102 102 102  CO2 31 28 27 30   GLUCOSE 106* 139* 144* 104*  BUN 23* 30* 24* 20  CALCIUM 9.1 8.8* 8.8* 8.6*  CREATININE 1.62* 1.72* 1.57* 1.62*  GFRNONAA 37* 35* 39* 37*  GFRAA 43* 40* 45* 43*    LIVER FUNCTION TESTS:  Recent Labs  05/12/16 1003  06/03/16 0837 08/06/16 1350 10/29/16 1404  BILITOT 0.7 1.0 1.0 0.8  AST 28 28 24 25   ALT 17 18 12* 12*  ALKPHOS 91 116 115 114  PROT 6.7 6.8 7.0 6.9  ALBUMIN 3.7 3.7 3.7 3.7    TUMOR MARKERS:  Recent Labs  05/12/16 1003  CEA 1.4  CA199 7    Assessment and Plan:  Gentleman with a history of Stage IIIB squamous cell esophageal cancer and right sided clear cell renal cancer.  He has new liver lesions concerning for metastatic disease.  Lesions are very small and may be challenging to biopsy if not visible sonographically.   1.) Proceed with US guided biopsy.   Thank you for this interesting consult.  I greatly enjoyed meeting Elias Dennington. and look forward to participating in their care.  A copy of this report was sent to the requesting provider on this date.  Electronically Signed: Jacqulynn Cadet, MD 11/24/2016, 8:01 AM   I spent a total of  15 Minutes   in face to face in clinical consultation, greater than 50% of which was counseling/coordinating care for liver lesion.

## 2016-11-24 NOTE — Discharge Instructions (Signed)
Moderate Conscious Sedation, Adult, Care After  These instructions provide you with information about caring for yourself after your procedure. Your health care provider may also give you more specific instructions. Your treatment has been planned according to current medical practices, but problems sometimes occur. Call your health care provider if you have any problems or questions after your procedure.  What can I expect after the procedure?  After your procedure, it is common:  · To feel sleepy for several hours.  · To feel clumsy and have poor balance for several hours.  · To have poor judgment for several hours.  · To vomit if you eat too soon.    Follow these instructions at home:  For at least 24 hours after the procedure:    · Do not:  ? Participate in activities where you could fall or become injured.  ? Drive.  ? Use heavy machinery.  ? Drink alcohol.  ? Take sleeping pills or medicines that cause drowsiness.  ? Make important decisions or sign legal documents.  ? Take care of children on your own.  · Rest.  Eating and drinking  · Follow the diet recommended by your health care provider.  · If you vomit:  ? Drink water, juice, or soup when you can drink without vomiting.  ? Make sure you have little or no nausea before eating solid foods.  General instructions  · Have a responsible adult stay with you until you are awake and alert.  · Take over-the-counter and prescription medicines only as told by your health care provider.  · If you smoke, do not smoke without supervision.  · Keep all follow-up visits as told by your health care provider. This is important.  Contact a health care provider if:  · You keep feeling nauseous or you keep vomiting.  · You feel light-headed.  · You develop a rash.  · You have a fever.  Get help right away if:  · You have trouble breathing.  This information is not intended to replace advice given to you by your health care provider. Make sure you discuss any questions you have  with your health care provider.  Document Released: 01/19/2013 Document Revised: 09/03/2015 Document Reviewed: 07/21/2015  Elsevier Interactive Patient Education © 2018 [employer] Inc[employer]  Needle Biopsy, Care After  Refer to this sheet in the next few weeks. These instructions provide you with information about caring for yourself after your procedure. Your health care provider may also give you more specific instructions. Your treatment has been planned according to current medical practices, but problems sometimes occur. Call your health care provider if you have any problems or questions after your procedure.  What can I expect after the procedure?  After your procedure, it is common to have soreness, bruising, or mild pain at the biopsy site. This should go away in a few days.  Follow these instructions at home:  · Rest as directed by your health care provider.  · Take medicines only as directed by your health care provider.  · There are many different ways to close and cover the biopsy site, including stitches (sutures), skin glue, and adhesive strips. Follow your health care provider's instructions about:  ? Biopsy site care.  ? Bandage (dressing) changes and removal.  ? Biopsy site closure removal.  · Check your biopsy site every day for signs of infection. Watch for:  ? Redness, swelling, or pain.  ? Fluid, blood, or pus.  Contact a health   care provider if:  · You have a fever.  · You have redness, swelling, or pain at the biopsy site that lasts longer than a few days.  · You have fluid, blood, or pus coming from the biopsy site.  · You feel nauseous.  · You vomit.  Get help right away if:  · You have shortness of breath.  · You have trouble breathing.  · You have chest pain.  · You feel dizzy or you faint.  · You have bleeding that does not stop with pressure or a bandage.  · You cough up blood.  · You have pain in your abdomen.  This information is not intended to replace advice given to you by your health care  provider. Make sure you discuss any questions you have with your health care provider.  Document Released: 08/15/2014 Document Revised: 09/06/2015 Document Reviewed: 03/27/2014  Elsevier Interactive Patient Education © 2018 Elsevier Inc.

## 2016-11-24 NOTE — Procedures (Signed)
Interventional Radiology Procedure Note  Procedure: US guided biopsy liver lesion.  Complications: None  Estimated Blood Loss: <25 mL  Recommendations: - Bedrest x 3 hrs - Path is pending  Signed,  Criselda Peaches, MD

## 2016-11-25 ENCOUNTER — Ambulatory Visit: Payer: Medicare Other | Admitting: Gastroenterology

## 2016-11-26 ENCOUNTER — Encounter: Payer: Self-pay | Admitting: Family Medicine

## 2016-11-26 ENCOUNTER — Ambulatory Visit (INDEPENDENT_AMBULATORY_CARE_PROVIDER_SITE_OTHER): Payer: Medicare Other | Admitting: Family Medicine

## 2016-11-26 ENCOUNTER — Telehealth: Payer: Self-pay | Admitting: Family Medicine

## 2016-11-26 VITALS — BP 110/70 | HR 94 | Temp 98.1°F | Resp 16 | Ht 71.0 in | Wt 252.5 lb

## 2016-11-26 DIAGNOSIS — E1142 Type 2 diabetes mellitus with diabetic polyneuropathy: Secondary | ICD-10-CM

## 2016-11-26 DIAGNOSIS — Z7409 Other reduced mobility: Secondary | ICD-10-CM | POA: Diagnosis not present

## 2016-11-26 LAB — POCT GLYCOSYLATED HEMOGLOBIN (HGB A1C): Hemoglobin A1C: 5.6

## 2016-11-26 LAB — SURGICAL PATHOLOGY

## 2016-11-26 NOTE — Patient Instructions (Signed)
I will take care of the power chair.  Stop the gabapentin.  Take care  Dr. Lacinda Axon

## 2016-11-26 NOTE — Assessment & Plan Note (Signed)
New problem. Patient is a good candidate for power mobility device. We'll send in documentation and prescription.

## 2016-11-26 NOTE — Progress Notes (Addendum)
Subjective:  Patient ID: Miguel [name]., male    DOB: 1930-02-02  Age: 83 y.o. MRN: [id number]  CC: Mobility examination  HPI:  83 year old male with an extensive past medical history including type 2 diabetes with neuropathy, COPD, squamous cell carcinoma of the esophagus with likely recurrence given metastatic lesions on recent MRI, dementia, atrial fibrillation, chronic kidney disease, heart failure, OSA, chronic knee pain presents for a mobility examination.  Patient's mobility and functional status has been declining. His mobility is limited by peripheral neuropathy, congestive heart failure, and knee osteoarthritis. These problems limit his ability to move around effectively and safely. He has difficulty ambulating to the bathroom, and to the dining hall. He is now unable to use a cane or a walker due to shortness of breath and severe knee pain. He is unable to use a manual wheelchair given congestive heart failure and upper extremity weakness. He is unable to use a scooter as he cannot easily and safely operated given his medical conditions and upper extremity weakness. Despite his dementia, he is mentally and physically capable of operating a power mobility device. He is willing and motivated to do so.  Social Hx   Social History   Social History  . Marital status: Married    Spouse name: N/A  . Number of children: N/A  . Years of education: N/A   Social History Main Topics  . Smoking status: Former Smoker    Types: Cigarettes    Quit date: 61  . Smokeless tobacco: Never Used  . Alcohol use No     Comment: quit 2012   . Drug use: No  . Sexual activity: Not Currently    Partners: Female   Other Topics Concern  . None   Social History Narrative  . None    Review of Systems  Musculoskeletal: Positive for arthralgias and gait problem.  Neurological: Positive for weakness.   Objective:  BP 110/70 (BP Location: Left Arm, Patient Position: Sitting, Cuff Size:  Normal)   Pulse 94   Temp 98.1 F (36.7 C) (Oral)   Resp 16   Ht 5\' 11"  (1.803 m)   Wt 252 lb 8 oz (114.5 kg)   SpO2 94%   BMI 35.22 kg/m   BP/Weight 11/26/2016 9/48/5462 7/0/3500  Systolic BP 938 182 92  Diastolic BP 70 72 58  Wt. (Lbs) 252.5 - 257  BMI 35.22 - 36.88   Physical Exam  Constitutional: He appears well-developed. No distress.  Cardiovascular: Normal rate and regular rhythm.   Trace lower extremity edema.  Pulmonary/Chest: Effort normal and breath sounds normal. No respiratory distress. He has no wheezes.  Musculoskeletal:  Upper extremities - 5/5 muscle strength in the right upper extremity. 4/5 muscle strength with flexion of the elbow. 4/5 handgrip strength. Normal ROM and 5/5 muscle strength of the left upper extremity.  Lower extremities - 4/5 quad strength bilaterally. 4/5 plantar flexion dorsiflexion of both feet/ankles. Antalgic gait.  Neurological: He is alert.  Psychiatric: He has a normal mood and affect.  Vitals reviewed.  Lab Results  Component Value Date   WBC 5.8 11/24/2016   HGB 14.1 11/24/2016   HCT 42.9 11/24/2016   PLT 129 (L) 11/24/2016   GLUCOSE 104 (H) 10/29/2016   CHOL 203 (H) 03/18/2016   TRIG 204.0 (H) 03/18/2016   HDL 48.40 03/18/2016   LDLDIRECT 108.0 03/18/2016   ALT 12 (L) 10/29/2016   AST 25 10/29/2016   NA 136 10/29/2016   K 4.4  10/29/2016   CL 102 10/29/2016   CREATININE 1.62 (H) 10/29/2016   BUN 20 10/29/2016   CO2 30 10/29/2016   TSH 1.986 06/03/2016   INR 1.06 11/24/2016   HGBA1C 5.6 11/26/2016    Assessment & Plan:   Problem List Items Addressed This Visit    DM type 2 with diabetic peripheral neuropathy ([place])   Relevant Orders   POCT glycosylated hemoglobin (Hb A1C) (Completed)   Mobility impaired - Primary    New problem. Patient is a good candidate for power mobility device. We'll send in documentation and prescription.        Follow-up: As scheduled.   [place]

## 2016-11-26 NOTE — Telephone Encounter (Signed)
Trina [phone] called from [facility] a round regarding a order and Rx for chair and skill missing in a chart note. Fax number [phone]. It was faxed today. Thank you!

## 2016-11-27 NOTE — Telephone Encounter (Signed)
Fax most recent chart.

## 2016-11-28 NOTE — Telephone Encounter (Signed)
Chart note faxed

## 2016-12-02 NOTE — Progress Notes (Signed)
[place]  [phone] [phone]  ID: Miguel [name]. 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  He currently feels well and is asymptomatic. He denies any dysphagia or difficulty swallowing. He has no neurologic complaints, although admits to worsening memory. He denies any recent fevers or illnesses. He has no chest pain, but continues to have chronic shortness of breath. He denies any nausea, vomiting, constipation, or diarrhea. He has no urinary complaints. Patient offers no specific complaints today.  REVIEW OF SYSTEMS:   Review of Systems  Constitutional: Negative.  Negative for fever, malaise/fatigue and weight loss.  Respiratory: Negative.  Negative for cough and shortness of breath.   Cardiovascular: Negative.  Negative for chest pain and leg swelling.  Gastrointestinal: Negative.  Negative for abdominal pain, heartburn, nausea and vomiting.  Genitourinary: Negative.   Musculoskeletal: Negative.   Skin: Negative.  Negative for rash.  Neurological: Negative.  Negative for sensory change and weakness.  Psychiatric/Behavioral: Positive for memory loss. The patient is not nervous/anxious.     As per HPI. Otherwise, a complete review of systems is negative.  PAST MEDICAL HISTORY: Past Medical History:  Diagnosis Date  . Alcoholism ([place])   . Anxiety   . COPD (chronic obstructive pulmonary disease) ([place])   . Dementia 03/18/2016  . DM type 2 with diabetic peripheral neuropathy ([place]) 03/18/2016  . Dyspnea    slight   . Dysrhythmia    atrial fib   . Esophageal cancer ([place])   . Esophageal stricture   . GERD (gastroesophageal reflux  disease) 03/18/2016  . Hyperlipidemia   . Irregular heart rhythm   . Kidney carcinoma ([place])    right, sp ablation  . Macular degeneration   . Neuropathy   . Renal mass     PAST SURGICAL HISTORY: Past Surgical History:  Procedure Laterality Date  . BACK SURGERY    . CATARACT EXTRACTION    . CYSTOSCOPY W/ RETROGRADES Right 07/21/2016   Procedure: CYSTOSCOPY WITH RETROGRADE PYELOGRAM;  Surgeon: Hollice Espy, MD;  Location: [facility] ORS;  Service: Urology;  Laterality: Right;  . CYSTOSCOPY WITH STENT PLACEMENT Right 07/21/2016   Procedure: CYSTOSCOPY WITH STENT PLACEMENT;  Surgeon: Hollice Espy, MD;  Location: [facility] ORS;  Service: Urology;  Laterality: Right;  . IR RADIOLOGIST EVAL & MGMT  09/09/2016  . IR RADIOLOGIST EVAL & MGMT  06/19/2016  . KNEE SURGERY    . RADIOLOGY WITH ANESTHESIA N/A 07/23/2016   Procedure: RENAL CRYOABLATION;  Surgeon: Aletta Edouard, MD;  Location: [facility];  Service: Radiology;  Laterality: N/A;    FAMILY HISTORY: Family History  Problem Relation Age of Onset  . Ovarian cancer Sister   . Throat cancer Brother   . Diabetes Mother   . Kidney disease Father   . Prostate cancer Neg Hx   . Kidney cancer Neg Hx     ADVANCED DIRECTIVES (Y/N):  N  HEALTH MAINTENANCE: Social History  Substance Use Topics  . Smoking status: Former Smoker    Types: Cigarettes    Quit date: 54  . Smokeless tobacco: Never Used  . Alcohol use No     Comment: quit 2012      Colonoscopy:  PAP:  Bone  density:  Lipid panel:  No Known Allergies  Current Outpatient Prescriptions  Medication Sig Dispense Refill  . ADVAIR DISKUS 250-50 MCG/DOSE AEPB Inhale 1 puff into the lungs 2 (two) times daily. 180 each 3  . aspirin 81 MG chewable tablet Chew 1 tablet (81 mg total) by mouth daily. 30 tablet 5  . atorvastatin (LIPITOR) 40 MG tablet Take 1 tablet (40 mg total) by mouth daily. 90 tablet 3  . donepezil (ARICEPT) 10 MG tablet TAKE 1 TABLET AT BEDTIME 90 tablet 1  . fesoterodine  (TOVIAZ) 8 MG TB24 tablet Take 1 tablet (8 mg total) by mouth daily. 90 tablet 2  . furosemide (LASIX) 20 MG tablet TAKE 1 TABLET DAILY 30 tablet 3  . glucose blood test strip Use as instructed 100 each 12  . ketoconazole (NIZORAL) 2 % cream Apply 1 application topically daily. 60 g 1  . metFORMIN (GLUCOPHAGE) 1000 MG tablet Take 1,000 mg by mouth 2 (two) times daily with a meal.    . metoprolol tartrate (LOPRESSOR) 25 MG tablet Take 0.5 tablets (12.5 mg total) by mouth 2 (two) times daily. 60 tablet 6  . Multiple Vitamins-Minerals (CENTRUM SILVER PO) Take 1 tablet by mouth daily.    Marland Kitchen omeprazole (PRILOSEC) 20 MG capsule Take 1 capsule (20 mg total) by mouth 2 (two) times daily before a meal. 180 capsule 3  . polyethylene glycol (MIRALAX / GLYCOLAX) packet Take 17 g by mouth daily. 14 each 0  . QUEtiapine (SEROQUEL) 50 MG tablet Take 1 tablet (50 mg total) by mouth at bedtime. 90 tablet 1  . tamsulosin (FLOMAX) 0.4 MG CAPS capsule Take 1 capsule (0.4 mg total) by mouth daily. 30 capsule 0   No current facility-administered medications for this visit.     OBJECTIVE: Vitals:   12/03/16 1427  BP: 108/74  Pulse: 93  Resp: 18  Temp: 98.1 F (36.7 C)     Body mass index is 34.44 kg/m.    ECOG FS:0 - Asymptomatic  General: Well-developed, well-nourished, no acute distress. Eyes: Pink conjunctiva, anicteric sclera. HEENT: Normocephalic, moist mucous membranes, clear oropharnyx. Lungs: Clear to auscultation bilaterally. Heart: Regular rate and rhythm. No rubs, murmurs, or gallops. Abdomen: Soft, nontender, nondistended. No organomegaly noted, normoactive bowel sounds. Musculoskeletal: No edema, cyanosis, or clubbing. Neuro: Alert, answering all questions appropriately. Cranial nerves grossly intact. Skin: No rashes or petechiae noted. Psych: Normal affect.  LAB RESULTS:  Lab Results  Component Value Date   NA 136 10/29/2016   K 4.4 10/29/2016   CL 102 10/29/2016   CO2 30 10/29/2016    GLUCOSE 104 (H) 10/29/2016   BUN 20 10/29/2016   CREATININE 1.62 (H) 10/29/2016   CALCIUM 8.6 (L) 10/29/2016   PROT 6.9 10/29/2016   ALBUMIN 3.7 10/29/2016   AST 25 10/29/2016   ALT 12 (L) 10/29/2016   ALKPHOS 114 10/29/2016   BILITOT 0.8 10/29/2016   GFRNONAA 37 (L) 10/29/2016   GFRAA 43 (L) 10/29/2016    Lab Results  Component Value Date   WBC 5.8 12/22/16   NEUTROABS 4.3 10/29/2016   HGB 14.1 22-Dec-2016   HCT 42.9 12-22-16   MCV 95.7 Dec 22, 2016   PLT 129 (L) 12-22-2016     STUDIES: US Biopsy  Result Date: 12-22-2016 INDICATION: 83 year old male with a history of stage IIIB squamous cell esophageal cancer and 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 He has new hepatic lesions concerning for metastatic disease. He presents for ultrasound-guided biopsy of the same to  confirm tissue diagnosis. EXAM: Ultrasound-guided biopsy of liver lesion MEDICATIONS: None. ANESTHESIA/SEDATION: Moderate (conscious) sedation was employed during this procedure. A total of Versed 2 mg and Fentanyl 100 mcg was administered intravenously. Moderate Sedation Time: 15 minutes. The patient's level of consciousness and vital signs were monitored continuously by radiology nursing throughout the procedure under my direct supervision. FLUOROSCOPY TIME:  Fluoroscopy Time: 0 minutes 0 seconds (0 mGy). COMPLICATIONS: None immediate. PROCEDURE: Informed written consent was obtained from the patient after a thorough discussion of the procedural risks, benefits and alternatives. All questions were addressed. A timeout was performed prior to the initiation of the procedure. The right upper quadrant was interrogated with ultrasound. A 1.6 x 1.1 cm lesion is successfully identified in hepatic segment 4 a. A 1.7 x 1.6 cm lesion is identified in the inferior aspect of hepatic segment 6. The hepatic segment 6 lesion will be targeted for biopsy. The overlying skin was prepped and draped in the standard sterile fashion  using chlorhexidine skin prep. Local anesthesia was attained by infiltration with 1% lidocaine. A small dermatotomy was made. Under real-time sonographic guidance, a 17 gauge introducer needle was advanced into the margin of the liver and directed to the periphery of the lesion. Multiple 18 gauge core biopsies were then coaxially obtained using the bio Pince automated biopsy device. Biopsy specimens were placed in formalin and delivered to pathology for further analysis. As the introducer needle was removed, the biopsy tract was embolized with a Gel-Foam slurry. The patient tolerated the procedure well. There was no evidence of immediate complication. IMPRESSION: Technically successful ultrasound-guided core biopsy of lesion in hepatic segment 6. Electronically Signed   By: Jacqulynn Cadet M.D.   On: 11/24/2016 10:55    ONCOLOGY HISTORY: Initial diagnosis was in approximately October 2016. This was followed by concurrent chemotherapy with infusional 5-FU and XRT.  Patient received low-dose weekly carboplatinum and Taxol in approximately April and May 2017. He subsequently had a second recurrence and was initiated on Taxol 40 mg/m on days 1, 8, and 15 (Taxol was dose reduced 50% secondary to neuropathy) along with Ramucirumab 8 mg/m on days 1 and 15 starting September 27, 2015 through January 24, 2016. This was a 28 day cycle.   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 Recent biopsy of the lesion in his liver consistent with his original diagnosis. Will get a PET scan to further evaluate the extent of his disease. After lengthy discussion with the patient, he wishes to pursue palliative treatment and will use the same regimen he received in [place] listed above. Return to clinic on December 09, 2016 to initiate cycle 1, day 1 of Taxol 40 mg/m and Ramucirumab 8  mg/m. Will reimage after cycle 3. 2. Neuropathy: Continue gabapentin and hydrocodone as prescribed. 3. Esophageal stricture: Patient has a history of multiple esophageal dilations. He reports he has a GI appointment in the next several weeks.  4. Right renal cell carcinoma: Patient had cryoablation performed on July 23, 2016. Repeat MRI November 03, 2016. Continue follow-up with interventional radiology as indicated. 5. Renal insufficiency: Patient's creatinine is approximately his baseline, monitor.   Patient expressed understanding and was in agreement with this plan. He also understands that He can call clinic at any time with any questions, concerns, or complaints.   Cancer Staging Squamous cell esophageal cancer [facility]) Staging  form: Esophagus - Squamous Cell Carcinoma, AJCC 7th Edition - Clinical stage from 02/27/2016: Stage IV (T3, N2, M1) - Signed by Lloyd Huger, MD on 12/02/2016   Lloyd Huger, MD   12/05/2016 2:53 PM

## 2016-12-03 ENCOUNTER — Telehealth: Payer: Self-pay | Admitting: Urology

## 2016-12-03 ENCOUNTER — Inpatient Hospital Stay: Payer: Medicare Other | Attending: Oncology | Admitting: Oncology

## 2016-12-03 VITALS — BP 108/74 | HR 93 | Temp 98.1°F | Resp 18 | Wt 246.9 lb

## 2016-12-03 DIAGNOSIS — F1020 Alcohol dependence, uncomplicated: Secondary | ICD-10-CM | POA: Insufficient documentation

## 2016-12-03 DIAGNOSIS — H3530 Unspecified macular degeneration: Secondary | ICD-10-CM | POA: Insufficient documentation

## 2016-12-03 DIAGNOSIS — J449 Chronic obstructive pulmonary disease, unspecified: Secondary | ICD-10-CM | POA: Diagnosis not present

## 2016-12-03 DIAGNOSIS — I4891 Unspecified atrial fibrillation: Secondary | ICD-10-CM | POA: Diagnosis not present

## 2016-12-03 DIAGNOSIS — Z79899 Other long term (current) drug therapy: Secondary | ICD-10-CM | POA: Insufficient documentation

## 2016-12-03 DIAGNOSIS — F0390 Unspecified dementia without behavioral disturbance: Secondary | ICD-10-CM | POA: Insufficient documentation

## 2016-12-03 DIAGNOSIS — Z800 Family history of malignant neoplasm of digestive organs: Secondary | ICD-10-CM | POA: Insufficient documentation

## 2016-12-03 DIAGNOSIS — E785 Hyperlipidemia, unspecified: Secondary | ICD-10-CM

## 2016-12-03 DIAGNOSIS — Z8041 Family history of malignant neoplasm of ovary: Secondary | ICD-10-CM

## 2016-12-03 DIAGNOSIS — C787 Secondary malignant neoplasm of liver and intrahepatic bile duct: Secondary | ICD-10-CM | POA: Diagnosis not present

## 2016-12-03 DIAGNOSIS — Z7982 Long term (current) use of aspirin: Secondary | ICD-10-CM | POA: Diagnosis not present

## 2016-12-03 DIAGNOSIS — Z9221 Personal history of antineoplastic chemotherapy: Secondary | ICD-10-CM | POA: Diagnosis not present

## 2016-12-03 DIAGNOSIS — F419 Anxiety disorder, unspecified: Secondary | ICD-10-CM | POA: Insufficient documentation

## 2016-12-03 DIAGNOSIS — C641 Malignant neoplasm of right kidney, except renal pelvis: Secondary | ICD-10-CM | POA: Insufficient documentation

## 2016-12-03 DIAGNOSIS — C159 Malignant neoplasm of esophagus, unspecified: Secondary | ICD-10-CM | POA: Insufficient documentation

## 2016-12-03 DIAGNOSIS — E1142 Type 2 diabetes mellitus with diabetic polyneuropathy: Secondary | ICD-10-CM | POA: Insufficient documentation

## 2016-12-03 DIAGNOSIS — K219 Gastro-esophageal reflux disease without esophagitis: Secondary | ICD-10-CM | POA: Diagnosis not present

## 2016-12-03 DIAGNOSIS — Z7984 Long term (current) use of oral hypoglycemic drugs: Secondary | ICD-10-CM | POA: Insufficient documentation

## 2016-12-03 DIAGNOSIS — G629 Polyneuropathy, unspecified: Secondary | ICD-10-CM | POA: Diagnosis not present

## 2016-12-03 DIAGNOSIS — K222 Esophageal obstruction: Secondary | ICD-10-CM | POA: Diagnosis not present

## 2016-12-03 DIAGNOSIS — Z87891 Personal history of nicotine dependence: Secondary | ICD-10-CM | POA: Diagnosis not present

## 2016-12-03 DIAGNOSIS — Z923 Personal history of irradiation: Secondary | ICD-10-CM | POA: Insufficient documentation

## 2016-12-03 DIAGNOSIS — Z5111 Encounter for antineoplastic chemotherapy: Secondary | ICD-10-CM | POA: Diagnosis present

## 2016-12-03 NOTE — Progress Notes (Signed)
Patient is here for results of liver biopsy. He does mention some discomfort where they did the biopsy.

## 2016-12-03 NOTE — Telephone Encounter (Signed)
I think is reasonable to defer follow-up. He is being followed by medical oncology, Dr. Grayland Ormond. He will be having scans on a fairly regular basis and we can just follow along that way.  Hollice Espy, MD

## 2016-12-03 NOTE — Telephone Encounter (Signed)
Pt wants to cancel upcoming appointment, states he feels fine and doesn't think he needs to be seen again unless it's necessary. Pt son states pt has had cancer show up in another place and pt just doesn't want to be seen. Please advise. Thanks.

## 2016-12-03 NOTE — Telephone Encounter (Signed)
Please advise 

## 2016-12-04 NOTE — Telephone Encounter (Signed)
Spoke w/patient's son and confirmed ok to cancel apt and to keep follow up with cancer center, this was done

## 2016-12-05 MED ORDER — PROCHLORPERAZINE MALEATE 10 MG PO TABS: 10.0000 mg | ORAL_TABLET | Freq: Four times a day (QID) | ORAL | 3 refills | Status: DC | PRN

## 2016-12-05 MED ORDER — ONDANSETRON HCL 8 MG PO TABS: 8.0000 mg | ORAL_TABLET | Freq: Two times a day (BID) | ORAL | 3 refills | Status: DC | PRN

## 2016-12-05 MED ORDER — LIDOCAINE-PRILOCAINE 2.5-2.5 % EX CREA: TOPICAL_CREAM | CUTANEOUS | 3 refills | Status: DC

## 2016-12-05 NOTE — Progress Notes (Signed)
START OFF PATHWAY REGIMEN - Gastroesophageal   [id number]:Ramucirumab 8 mg/kg Days 1, 15 + Paclitaxel 80 mg/m2 Days 1, 8, 15 q28 Days:   A cycle is every 28 days:     Ramucirumab      Paclitaxel   **Always confirm dose/schedule in your pharmacy ordering system**    Patient Characteristics: Distant Metastases (cM1/pM1) / Locally Recurrent Disease, Squamous Cell, Esophageal & [place], Second Line and Beyond, MSS / pMMR or MSI Unknown Histology: Squamous Cell Disease Classification: Esophageal Therapeutic Status: Distant Metastases (No Additional Staging) Would you be surprised if this patient died  in the next year? I would NOT be surprised if this patient died in the next year Line of therapy: Second Line and Beyond Microsatellite/Mismatch Repair Status: Unknown Intent of Therapy: Non-Curative / Palliative Intent, Discussed with Patient

## 2016-12-07 NOTE — Progress Notes (Signed)
[place]  Telephone:([phone] [phone]  ID: Miguel [name]. 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 He continues to feel well and is asymptomatic. He denies any pain. He denies any dysphagia or difficulty swallowing. He has no neurologic complaints, although admits to worsening memory. He denies any recent fevers or illnesses. He has no chest pain, but continues to have chronic shortness of breath. He denies any nausea, vomiting, constipation, or diarrhea. He has no urinary complaints. Patient offers no specific complaints today.  REVIEW OF SYSTEMS:   Review of Systems  Constitutional: Negative.  Negative for fever, malaise/fatigue and weight loss.  Respiratory: Negative.  Negative for cough and shortness of breath.   Cardiovascular: Negative.  Negative for chest pain and leg swelling.  Gastrointestinal: Negative.  Negative for abdominal pain, heartburn, nausea and vomiting.  Genitourinary: Negative.   Musculoskeletal: Negative.   Skin: Negative.  Negative for rash.  Neurological: Negative.  Negative for sensory change and weakness.  Psychiatric/Behavioral: Positive for memory loss. The patient is not nervous/anxious.     As per HPI. Otherwise, a complete review of systems is negative.  PAST MEDICAL HISTORY: Past Medical History:  Diagnosis Date  . Alcoholism ([place])   . Anxiety   . COPD (chronic obstructive pulmonary disease) ([place])   . Dementia 03/18/2016  . DM type 2 with diabetic peripheral neuropathy ([place]) 03/18/2016  . Dyspnea    slight   . Dysrhythmia    atrial fib   . Esophageal cancer ([place])   .  Esophageal stricture   . GERD (gastroesophageal reflux disease) 03/18/2016  . Hyperlipidemia   . Irregular heart rhythm   . Kidney carcinoma ([place])    right, sp ablation  . Macular degeneration   . Neuropathy   . Renal mass     PAST SURGICAL HISTORY: Past Surgical History:  Procedure Laterality Date  . BACK SURGERY    . CATARACT EXTRACTION    . CYSTOSCOPY W/ RETROGRADES Right 07/21/2016   Procedure: CYSTOSCOPY WITH RETROGRADE PYELOGRAM;  Surgeon: Hollice Espy, MD;  Location: [facility] ORS;  Service: Urology;  Laterality: Right;  . CYSTOSCOPY WITH STENT PLACEMENT Right 07/21/2016   Procedure: CYSTOSCOPY WITH STENT PLACEMENT;  Surgeon: Hollice Espy, MD;  Location: [facility] ORS;  Service: Urology;  Laterality: Right;  . IR RADIOLOGIST EVAL & MGMT  09/09/2016  . IR RADIOLOGIST EVAL & MGMT  06/19/2016  . KNEE SURGERY    . RADIOLOGY WITH ANESTHESIA N/A 07/23/2016   Procedure: RENAL CRYOABLATION;  Surgeon: Aletta Edouard, MD;  Location: [facility];  Service: Radiology;  Laterality: N/A;    FAMILY HISTORY: Family History  Problem Relation Age of Onset  . Ovarian cancer Sister   . Throat cancer Brother   . Diabetes Mother   . Kidney disease Father   . Prostate cancer Neg Hx   . Kidney cancer Neg Hx     ADVANCED DIRECTIVES (Y/N):  N  HEALTH MAINTENANCE: Social History  Substance Use Topics  . Smoking status: Former Smoker    Types: Cigarettes    Quit date: 57  . Smokeless tobacco: Never Used  . Alcohol use No  Comment: quit 2012      Colonoscopy:  PAP:  Bone density:  Lipid panel:  No Known Allergies  Current Outpatient Prescriptions  Medication Sig Dispense Refill  . ADVAIR DISKUS 250-50 MCG/DOSE AEPB Inhale 1 puff into the lungs 2 (two) times daily. 180 each 3  . aspirin 81 MG chewable tablet Chew 1 tablet (81 mg total) by mouth daily. 30 tablet 5  . atorvastatin (LIPITOR) 40 MG tablet Take 1 tablet (40 mg total) by mouth daily. 90 tablet 3  . donepezil (ARICEPT) 10 MG tablet  TAKE 1 TABLET AT BEDTIME 90 tablet 1  . fesoterodine (TOVIAZ) 8 MG TB24 tablet Take 1 tablet (8 mg total) by mouth daily. 90 tablet 2  . furosemide (LASIX) 20 MG tablet TAKE 1 TABLET DAILY 30 tablet 3  . glucose blood test strip Use as instructed 100 each 12  . ketoconazole (NIZORAL) 2 % cream Apply 1 application topically daily. 60 g 1  . lidocaine-prilocaine (EMLA) cream Apply to affected area once 30 g 3  . metFORMIN (GLUCOPHAGE) 1000 MG tablet Take 1,000 mg by mouth 2 (two) times daily with a meal.    . metoprolol tartrate (LOPRESSOR) 25 MG tablet Take 0.5 tablets (12.5 mg total) by mouth 2 (two) times daily. 60 tablet 6  . Multiple Vitamins-Minerals (CENTRUM SILVER PO) Take 1 tablet by mouth daily.    Marland Kitchen omeprazole (PRILOSEC) 20 MG capsule Take 1 capsule (20 mg total) by mouth 2 (two) times daily before a meal. 180 capsule 3  . ondansetron (ZOFRAN) 8 MG tablet Take 1 tablet (8 mg total) by mouth 2 (two) times daily as needed (Nausea or vomiting). 30 tablet 3  . polyethylene glycol (MIRALAX / GLYCOLAX) packet Take 17 g by mouth daily. 14 each 0  . prochlorperazine (COMPAZINE) 10 MG tablet Take 1 tablet (10 mg total) by mouth every 6 (six) hours as needed (Nausea or vomiting). 30 tablet 3  . QUEtiapine (SEROQUEL) 50 MG tablet Take 1 tablet (50 mg total) by mouth at bedtime. 90 tablet 1  . tamsulosin (FLOMAX) 0.4 MG CAPS capsule Take 1 capsule (0.4 mg total) by mouth daily. 30 capsule 0   No current facility-administered medications for this visit.     OBJECTIVE: Vitals:   12/09/16 1008  BP: 97/65  Pulse: 76  Resp: 18  Temp: 97.6 F (36.4 C)     Body mass index is 34.21 kg/m.    ECOG FS:0 - Asymptomatic  General: Well-developed, well-nourished, no acute distress. Eyes: Pink conjunctiva, anicteric sclera. HEENT: Normocephalic, moist mucous membranes, clear oropharnyx. Lungs: Clear to auscultation bilaterally. Heart: Regular rate and rhythm. No rubs, murmurs, or gallops. Abdomen:  Soft, nontender, nondistended. No organomegaly noted, normoactive bowel sounds. Musculoskeletal: No edema, cyanosis, or clubbing. Neuro: Alert, answering all questions appropriately. Cranial nerves grossly intact. Skin: No rashes or petechiae noted. Psych: Normal affect.  LAB RESULTS:  Lab Results  Component Value Date   NA 136 12/09/2016   K 3.7 12/09/2016   CL 99 (L) 12/09/2016   CO2 26 12/09/2016   GLUCOSE 204 (H) 12/09/2016   BUN 22 (H) 12/09/2016   CREATININE 1.66 (H) 12/09/2016   CALCIUM 9.0 12/09/2016   PROT 7.0 12/09/2016   ALBUMIN 3.8 12/09/2016   AST 32 12/09/2016   ALT 13 (L) 12/09/2016   ALKPHOS 140 (H) 12/09/2016   BILITOT 1.1 12/09/2016   GFRNONAA 36 (L) 12/09/2016   GFRAA 42 (L) 12/09/2016    Lab Results  Component Value  Date   WBC 5.8 12/09/2016   NEUTROABS 4.4 12/09/2016   HGB 13.0 12/09/2016   HCT 38.0 (L) 12/09/2016   MCV 94.3 12/09/2016   PLT 179 12/09/2016     STUDIES: Nm Pet Image Restag (ps) Skull Base To Thigh  Result Date: 12/08/2016 CLINICAL DATA:  Subsequent treatment strategy for squamous cell carcinoma of the esophagus. Additional history of right renal mass status post cryoablation. EXAM: NUCLEAR MEDICINE PET SKULL BASE TO THIGH TECHNIQUE: 11.7 mCi F-18 FDG was injected intravenously. Full-ring PET imaging was performed from the skull base to thigh after the radiotracer. CT data was obtained and used for attenuation correction and anatomic localization. FASTING BLOOD GLUCOSE:  Value: 101 mg/dl COMPARISON:  No prior PET-CT. Chest CT 06/03/2016. CT of the chest, abdomen and pelvis 05/12/2016. MRI of the abdomen 11/03/2016. FINDINGS: NECK: Small lymph nodes posterior to the jugular veins bilaterally at the level of the thoracic inlet are hypermetabolic, largest of which measures 11 mm in short axis on the right side (axial image 65 of series 3, SUVmax = 7.0). CHEST: There is a small focus of hypermetabolism in the superior aspect of the left hilar  nodal station (SUVmax = 4.1), which appears to correspond to a small 8 mm short axis lymph node (axial image 93 of series 3). No other hypermetabolic mediastinal lymph nodes. No definite esophageal hypermetabolism or mass confidently identified. No suspicious pulmonary nodules on the CT scan. Atherosclerotic calcifications in the thoracic aorta, as well is a coronary arteries, including calcified atherosclerotic plaque in the left main, left anterior descending, left circumflex and right coronary arteries. Mild calcifications of the aortic valve. Right internal jugular single-lumen porta cath with tip terminating in the right atrium. ABDOMEN/PELVIS: Several areas of hypermetabolism in the liver, most hypermetabolic and largest of which corresponds to a hypoattenuating lesion which distorts the liver capsule in the inferior aspect of segment 6 (axial image 185 of series 3, SUVmax = 12.1) which measures approximately 4.9 x 6.4 cm. Although comparison between modalities is challenging, these lesions do appear larger than prior MRI of the abdomen 11/03/2016. No abnormal hypermetabolic activity within the pancreas, adrenal glands, or spleen. Enlarged and hypermetabolic lymph nodes are noted in the portacaval nodal station (SUVmax = 10.1)and hepatoduodenal ligament nodal station (SUVmax = 9.1), measuring 17 mm and 15 mm in short axis respectively on images 161 and 156 of series 3. Multiple other smaller hypermetabolic retroperitoneal lymph nodes are also noted, largest of which is in the left para-aortic nodal station (SUVmax = 4.9) measuring 9 mm in short axis (axial image 192 of series 3). No hypermetabolic pelvic lymphadenopathy. Several calcified gallstones lie dependently in the gallbladder. No surrounding inflammatory changes to suggest an acute diverticulitis at this time. Multiple renal lesions are noted, incompletely characterized on today's noncontrast CT examination, largest of which measures 4 cm in the medial  aspect of the interpolar region of the left kidney (see prior report for MRI of the abdomen 11/03/2016 for characterization of these lesions). Aortic atherosclerosis without definite aneurysm in the abdomen or pelvis. High attenuation (55 HU) fluid in the low anatomic pelvis is either very proteinaceous or hemorrhagic. No pneumoperitoneum. SKELETON: Multiple mixed lytic and sclerotic lesions are noted throughout the visualized axial and appendicular skeleton, the most of which demonstrate hypermetabolism, compatible with widespread metastatic disease to the bones. The largest of these is in the right ilium spanning much of the right iliac crest (SUVmax = 8.1). IMPRESSION: 1. Findings are compatible with widespread metastatic  disease to the bones, liver and lymph nodes in the abdomen, thorax and lower neck, as detailed above. 2. No definite esophageal mass or hypermetabolism identified on today's examination. 3. Small volume of high attenuation fluid in the low anatomic pelvis is either highly proteinaceous or hemorrhagic. This is of uncertain etiology and significance, but is new compared to prior CT the chest, abdomen and pelvis 05/12/2016. 4. Cholelithiasis without evidence of acute cholecystitis at this time. 5. Aortic atherosclerosis, in addition to left main and 3 vessel coronary artery disease. 6. Additional incidental findings, as above. Electronically Signed   By: Vinnie Langton M.D.   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 He has new hepatic lesions concerning for metastatic disease. He presents for ultrasound-guided biopsy of the same to confirm tissue diagnosis. EXAM: Ultrasound-guided biopsy of liver lesion MEDICATIONS: None. ANESTHESIA/SEDATION: Moderate (conscious) sedation was employed during this procedure. A total of Versed 2 mg and  Fentanyl 100 mcg was administered intravenously. Moderate Sedation Time: 15 minutes. The patient's level of consciousness and vital signs were monitored continuously by radiology nursing throughout the procedure under my direct supervision. FLUOROSCOPY TIME:  Fluoroscopy Time: 0 minutes 0 seconds (0 mGy). COMPLICATIONS: None immediate. PROCEDURE: Informed written consent was obtained from the patient after a thorough discussion of the procedural risks, benefits and alternatives. All questions were addressed. A timeout was performed prior to the initiation of the procedure. The right upper quadrant was interrogated with ultrasound. A 1.6 x 1.1 cm lesion is successfully identified in hepatic segment 4 a. A 1.7 x 1.6 cm lesion is identified in the inferior aspect of hepatic segment 6. The hepatic segment 6 lesion will be targeted for biopsy. The overlying skin was prepped and draped in the standard sterile fashion using chlorhexidine skin prep. Local anesthesia was attained by infiltration with 1% lidocaine. A small dermatotomy was made. Under real-time sonographic guidance, a 17 gauge introducer needle was advanced into the margin of the liver and directed to the periphery of the lesion. Multiple 18 gauge core biopsies were then coaxially obtained using the bio Pince automated biopsy device. Biopsy specimens were placed in formalin and delivered to pathology for further analysis. As the introducer needle was removed, the biopsy tract was embolized with a Gel-Foam slurry. The patient tolerated the procedure well. There was no evidence of immediate complication. IMPRESSION: Technically successful ultrasound-guided core biopsy of lesion in hepatic segment 6. Electronically Signed   By: Jacqulynn Cadet M.D.   On: 11/24/2016 10:55    ONCOLOGY HISTORY: Initial diagnosis was in approximately October 2016. This was followed by concurrent chemotherapy with infusional 5-FU and XRT.  Patient received low-dose weekly  carboplatinum and Taxol in approximately April and May 2017. He subsequently had a second recurrence and was initiated on Taxol 40 mg/m on days 1, 8, and 15 (Taxol was dose reduced 50% secondary to neuropathy) along with Ramucirumab 8 mg/m on days 1 and 15 starting September 27, 2015 through January 24, 2016. This was a 28 day cycle.   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 Recent biopsy of the lesion in his liver consistent with his original diagnosis. PET scan results from November 24, 2016 reviewed independently and reported  as above with widespread metastatic disease in his liver, lymph nodes, and bones. After lengthy discussion with the patient, he wishes to pursue palliative treatment and will use the same regimen he received in [place] listed above. Proceed with cycle 1, day 1 of Taxol 40 mg/m and Ramucirumab 8 mg/m today. Patient will also receive Xgeva for his bony disease on day 1 of every cycle. Return to clinic in 1 week for consideration of cycle 1, day 8 which will be Taxol only. Will reimage after cycle 3. 2. Neuropathy: Continue gabapentin and hydrocodone as prescribed. 3. Esophageal stricture: Patient has a history of multiple esophageal dilations. He reports he has a GI appointment in the next several weeks.  4. Right renal cell carcinoma: Patient had cryoablation performed on July 23, 2016. Repeat MRI November 03, 2016. Continue follow-up with interventional radiology as indicated. 5. Renal insufficiency: Patient's creatinine is approximately his baseline, monitor. 6. Bony metastatic disease: Xgeva as above.   Patient expressed understanding and was in agreement with this plan. He also understands that He can call clinic at any time with any questions, concerns, or complaints.   Cancer Staging Squamous cell esophageal cancer  [facility]) Staging form: Esophagus - Squamous Cell Carcinoma, AJCC 7th Edition - Clinical stage from 02/27/2016: Stage IV (T3, N2, M1) - Signed by Lloyd Huger, MD on 12/02/2016   Lloyd Huger, MD   12/09/2016 10:36 AM

## 2016-12-08 ENCOUNTER — Encounter
Admission: RE | Admit: 2016-12-08 | Discharge: 2016-12-08 | Disposition: A | Discharge status: Home / Self Care | Payer: Medicare Other | Source: Ambulatory Visit | Attending: Oncology | Admitting: Oncology

## 2016-12-08 ENCOUNTER — Other Ambulatory Visit: Payer: Self-pay | Admitting: Oncology

## 2016-12-08 DIAGNOSIS — C159 Malignant neoplasm of esophagus, unspecified: Secondary | ICD-10-CM | POA: Insufficient documentation

## 2016-12-08 LAB — GLUCOSE, CAPILLARY: GLUCOSE-CAPILLARY: 101 mg/dL — AB (ref 65–99)

## 2016-12-08 MED ORDER — FLUDEOXYGLUCOSE F - 18 (FDG) INJECTION
12.0000 | Freq: Once | INTRAVENOUS | Status: AC | PRN
  Administered 2016-12-08: 11.686 via INTRAVENOUS

## 2016-12-08 NOTE — Telephone Encounter (Signed)
Pt has requested a update on hover round Pt contact [phone] Pt was inquiring about this with another office, the office called here for pt

## 2016-12-08 NOTE — Telephone Encounter (Signed)
Called and advised patient that paperwork was faxed.

## 2016-12-09 ENCOUNTER — Inpatient Hospital Stay: Payer: Medicare Other

## 2016-12-09 ENCOUNTER — Inpatient Hospital Stay (HOSPITAL_BASED_OUTPATIENT_CLINIC_OR_DEPARTMENT_OTHER): Payer: Medicare Other | Admitting: Oncology

## 2016-12-09 VITALS — BP 112/75 | HR 71 | Resp 18

## 2016-12-09 VITALS — BP 97/65 | HR 76 | Temp 97.6°F | Resp 18 | Wt 245.3 lb

## 2016-12-09 DIAGNOSIS — J449 Chronic obstructive pulmonary disease, unspecified: Secondary | ICD-10-CM | POA: Diagnosis not present

## 2016-12-09 DIAGNOSIS — C641 Malignant neoplasm of right kidney, except renal pelvis: Secondary | ICD-10-CM

## 2016-12-09 DIAGNOSIS — C787 Secondary malignant neoplasm of liver and intrahepatic bile duct: Secondary | ICD-10-CM

## 2016-12-09 DIAGNOSIS — Z8041 Family history of malignant neoplasm of ovary: Secondary | ICD-10-CM

## 2016-12-09 DIAGNOSIS — F419 Anxiety disorder, unspecified: Secondary | ICD-10-CM

## 2016-12-09 DIAGNOSIS — H3530 Unspecified macular degeneration: Secondary | ICD-10-CM

## 2016-12-09 DIAGNOSIS — K222 Esophageal obstruction: Secondary | ICD-10-CM

## 2016-12-09 DIAGNOSIS — Z9221 Personal history of antineoplastic chemotherapy: Secondary | ICD-10-CM

## 2016-12-09 DIAGNOSIS — E785 Hyperlipidemia, unspecified: Secondary | ICD-10-CM | POA: Diagnosis not present

## 2016-12-09 DIAGNOSIS — C159 Malignant neoplasm of esophagus, unspecified: Secondary | ICD-10-CM

## 2016-12-09 DIAGNOSIS — F0390 Unspecified dementia without behavioral disturbance: Secondary | ICD-10-CM

## 2016-12-09 DIAGNOSIS — K219 Gastro-esophageal reflux disease without esophagitis: Secondary | ICD-10-CM

## 2016-12-09 DIAGNOSIS — Z7984 Long term (current) use of oral hypoglycemic drugs: Secondary | ICD-10-CM

## 2016-12-09 DIAGNOSIS — Z800 Family history of malignant neoplasm of digestive organs: Secondary | ICD-10-CM

## 2016-12-09 DIAGNOSIS — Z87891 Personal history of nicotine dependence: Secondary | ICD-10-CM

## 2016-12-09 DIAGNOSIS — E1142 Type 2 diabetes mellitus with diabetic polyneuropathy: Secondary | ICD-10-CM

## 2016-12-09 DIAGNOSIS — Z5111 Encounter for antineoplastic chemotherapy: Secondary | ICD-10-CM | POA: Diagnosis not present

## 2016-12-09 DIAGNOSIS — F1020 Alcohol dependence, uncomplicated: Secondary | ICD-10-CM

## 2016-12-09 DIAGNOSIS — Z7982 Long term (current) use of aspirin: Secondary | ICD-10-CM

## 2016-12-09 DIAGNOSIS — I4891 Unspecified atrial fibrillation: Secondary | ICD-10-CM | POA: Diagnosis not present

## 2016-12-09 DIAGNOSIS — Z923 Personal history of irradiation: Secondary | ICD-10-CM | POA: Diagnosis not present

## 2016-12-09 DIAGNOSIS — Z79899 Other long term (current) drug therapy: Secondary | ICD-10-CM

## 2016-12-09 LAB — COMPREHENSIVE METABOLIC PANEL
ALT: 13 U/L — AB (ref 17–63)
ANION GAP: 11 (ref 5–15)
AST: 32 U/L (ref 15–41)
Albumin: 3.8 g/dL (ref 3.5–5.0)
Alkaline Phosphatase: 140 U/L — ABNORMAL HIGH (ref 38–126)
BUN: 22 mg/dL — ABNORMAL HIGH (ref 6–20)
CALCIUM: 9 mg/dL (ref 8.9–10.3)
CHLORIDE: 99 mmol/L — AB (ref 101–111)
CO2: 26 mmol/L (ref 22–32)
CREATININE: 1.66 mg/dL — AB (ref 0.61–1.24)
GFR, EST AFRICAN AMERICAN: 42 mL/min — AB (ref >60)
GFR, EST NON AFRICAN AMERICAN: 36 mL/min — AB (ref >60)
Glucose, Bld: 204 mg/dL — ABNORMAL HIGH (ref 65–99)
Potassium: 3.7 mmol/L (ref 3.5–5.1)
SODIUM: 136 mmol/L (ref 135–145)
Total Bilirubin: 1.1 mg/dL (ref 0.3–1.2)
Total Protein: 7 g/dL (ref 6.5–8.1)

## 2016-12-09 LAB — CBC WITH DIFFERENTIAL/PLATELET
Basophils Absolute: 0 10*3/uL (ref 0–0.1)
Basophils Relative: 1 %
EOS ABS: 0.3 10*3/uL (ref 0–0.7)
EOS PCT: 5 %
HCT: 38 % — ABNORMAL LOW (ref 40.0–52.0)
Hemoglobin: 13 g/dL (ref 13.0–18.0)
LYMPHS ABS: 0.6 10*3/uL — AB (ref 1.0–3.6)
LYMPHS PCT: 10 %
MCH: 32.2 pg (ref 26.0–34.0)
MCHC: 34.2 g/dL (ref 32.0–36.0)
MCV: 94.3 fL (ref 80.0–100.0)
MONO ABS: 0.4 10*3/uL (ref 0.2–1.0)
MONOS PCT: 7 %
Neutro Abs: 4.4 10*3/uL (ref 1.4–6.5)
Neutrophils Relative %: 77 %
PLATELETS: 179 10*3/uL (ref 150–440)
RBC: 4.03 MIL/uL — AB (ref 4.40–5.90)
RDW: 16.3 % — ABNORMAL HIGH (ref 11.5–14.5)
WBC: 5.8 10*3/uL (ref 3.8–10.6)

## 2016-12-09 MED ORDER — DIPHENHYDRAMINE HCL 50 MG/ML IJ SOLN
25.0000 mg | Freq: Once | INTRAMUSCULAR | Status: AC
  Administered 2016-12-09: 25 mg via INTRAVENOUS
  Filled 2016-12-09: qty 1

## 2016-12-09 MED ORDER — HEPARIN SOD (PORK) LOCK FLUSH 100 UNIT/ML IV SOLN
500.0000 [IU] | Freq: Once | INTRAVENOUS | Status: AC | PRN
  Administered 2016-12-09: 500 [IU]
  Filled 2016-12-09: qty 5

## 2016-12-09 MED ORDER — SODIUM CHLORIDE 0.9 % IV SOLN: 10.0000 mg | Freq: Once | INTRAVENOUS | Status: DC

## 2016-12-09 MED ORDER — DEXAMETHASONE SODIUM PHOSPHATE 10 MG/ML IJ SOLN
10.0000 mg | Freq: Once | INTRAMUSCULAR | Status: AC
  Administered 2016-12-09: 10 mg via INTRAVENOUS
  Filled 2016-12-09: qty 1

## 2016-12-09 MED ORDER — SODIUM CHLORIDE 0.9% FLUSH
10.0000 mL | INTRAVENOUS | Status: DC | PRN
  Administered 2016-12-09: 10 mL
  Filled 2016-12-09: qty 10

## 2016-12-09 MED ORDER — ACETAMINOPHEN 325 MG PO TABS
650.0000 mg | ORAL_TABLET | Freq: Once | ORAL | Status: AC
  Administered 2016-12-09: 650 mg via ORAL
  Filled 2016-12-09: qty 2

## 2016-12-09 MED ORDER — FAMOTIDINE IN NACL 20-0.9 MG/50ML-% IV SOLN
20.0000 mg | Freq: Once | INTRAVENOUS | Status: AC
  Administered 2016-12-09: 20 mg via INTRAVENOUS
  Filled 2016-12-09: qty 50

## 2016-12-09 MED ORDER — RAMUCIRUMAB CHEMO INJECTION 500 MG/50ML
8.0000 mg/kg | Freq: Once | INTRAVENOUS | Status: AC
  Administered 2016-12-09: 900 mg via INTRAVENOUS
  Filled 2016-12-09: qty 50

## 2016-12-09 MED ORDER — PACLITAXEL CHEMO INJECTION 300 MG/50ML
40.0000 mg/m2 | Freq: Once | INTRAVENOUS | Status: AC
  Administered 2016-12-09: 96 mg via INTRAVENOUS
  Filled 2016-12-09: qty 16

## 2016-12-09 MED ORDER — SODIUM CHLORIDE 0.9 % IV SOLN
Freq: Once | INTRAVENOUS | Status: AC
  Administered 2016-12-09: 11:00:00 via INTRAVENOUS
  Filled 2016-12-09: qty 1000

## 2016-12-09 MED ORDER — DENOSUMAB 120 MG/1.7ML ~~LOC~~ SOLN
120.0000 mg | Freq: Once | SUBCUTANEOUS | Status: AC
  Administered 2016-12-09: 120 mg via SUBCUTANEOUS
  Filled 2016-12-09: qty 1.7

## 2016-12-09 NOTE — Progress Notes (Signed)
Patient is here for results of PET scan

## 2016-12-10 ENCOUNTER — Ambulatory Visit: Payer: Medicare Other | Admitting: Urology

## 2016-12-10 ENCOUNTER — Inpatient Hospital Stay: Payer: Medicare Other

## 2016-12-12 NOTE — Telephone Encounter (Signed)
Hover round requested to know if the form was received to add upper limb strength  This form will be re-faxed today. Fax [phone]

## 2016-12-12 NOTE — Progress Notes (Signed)
Miguel [name]  Telephone:([phone] [phone]  ID: Efraim Kaufmann. 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 Taxol only today. He tolerated his first treatment well. He admits to intermittent chest pain that resolves without intervention and occasional bilateral hand pain. Patient's wife also reports his hands turn white during these painful episodes. He otherwise feels well and is asymptomatic. He denies any other pain. He denies any dysphagia or difficulty swallowing. He has no neurologic complaints, although admits to worsening memory. He denies any recent fevers or illnesses. He continues to have chronic shortness of breath. He denies any nausea, vomiting, constipation, or diarrhea. He has no urinary complaints. Patient offers no specific complaints today.  REVIEW OF SYSTEMS:   Review of Systems  Constitutional: Negative.  Negative for fever, malaise/fatigue and weight loss.  Respiratory: Positive for shortness of breath. Negative for cough.   Cardiovascular: Positive for chest pain. Negative for leg swelling.  Gastrointestinal: Negative.  Negative for abdominal pain, heartburn, nausea and vomiting.  Genitourinary: Negative.   Musculoskeletal: Negative.   Skin: Negative.  Negative for rash.  Neurological: Negative.  Negative for sensory change and weakness.  Psychiatric/Behavioral: Positive for memory loss. The patient is not nervous/anxious.     As per HPI. Otherwise, a complete review of systems is negative.  PAST MEDICAL HISTORY: Past Medical History:  Diagnosis Date  . Alcoholism ([place])   . Anxiety   . COPD (chronic obstructive pulmonary  disease) ([place])   . Dementia 03/18/2016  . DM type 2 with diabetic peripheral neuropathy ([employer]) 03/18/2016  . Dyspnea    slight   . Dysrhythmia    atrial fib   . Esophageal cancer ([place])   . Esophageal stricture   . GERD (gastroesophageal reflux disease) 03/18/2016  . Hyperlipidemia   . Irregular heart rhythm   . Kidney carcinoma ([place])    right, sp ablation  . Macular degeneration   . Neuropathy   . Renal mass     PAST SURGICAL HISTORY: Past Surgical History:  Procedure Laterality Date  . BACK SURGERY    . CATARACT EXTRACTION    . CYSTOSCOPY W/ RETROGRADES Right 07/21/2016   Procedure: CYSTOSCOPY WITH RETROGRADE PYELOGRAM;  Surgeon: Hollice Espy, MD;  Location: [facility] ORS;  Service: Urology;  Laterality: Right;  . CYSTOSCOPY WITH STENT PLACEMENT Right 07/21/2016   Procedure: CYSTOSCOPY WITH STENT PLACEMENT;  Surgeon: Hollice Espy, MD;  Location: [facility] ORS;  Service: Urology;  Laterality: Right;  . IR RADIOLOGIST EVAL & MGMT  09/09/2016  . IR RADIOLOGIST EVAL & MGMT  06/19/2016  . KNEE SURGERY    . RADIOLOGY WITH ANESTHESIA N/A 07/23/2016   Procedure: RENAL CRYOABLATION;  Surgeon: Aletta Edouard, MD;  Location: [facility];  Service: Radiology;  Laterality: N/A;    FAMILY HISTORY: Family History  Problem Relation Age of Onset  . Ovarian cancer Sister   . Throat cancer Brother   . Diabetes Mother   . Kidney disease Father   . Prostate cancer Neg Hx   . Kidney cancer Neg Hx     ADVANCED DIRECTIVES (Y/N):  N  HEALTH MAINTENANCE: Social History  Substance Use Topics  . Smoking status: Former Smoker  Types: Cigarettes    Quit date: 42  . Smokeless tobacco: Never Used  . Alcohol use No     Comment: quit 2012      Colonoscopy:  PAP:  Bone density:  Lipid panel:  No Known Allergies  Current Outpatient Prescriptions  Medication Sig Dispense Refill  . ADVAIR DISKUS 250-50 MCG/DOSE AEPB Inhale 1 puff into the lungs 2 (two) times daily. 180 each 3  . aspirin 81 MG chewable  tablet Chew 1 tablet (81 mg total) by mouth daily. 30 tablet 5  . atorvastatin (LIPITOR) 40 MG tablet Take 1 tablet (40 mg total) by mouth daily. 90 tablet 3  . donepezil (ARICEPT) 10 MG tablet TAKE 1 TABLET AT BEDTIME 90 tablet 1  . fesoterodine (TOVIAZ) 8 MG TB24 tablet Take 1 tablet (8 mg total) by mouth daily. 90 tablet 2  . furosemide (LASIX) 20 MG tablet TAKE 1 TABLET DAILY 30 tablet 3  . glucose blood test strip Use as instructed 100 each 12  . ketoconazole (NIZORAL) 2 % cream Apply 1 application topically daily. 60 g 1  . lidocaine-prilocaine (EMLA) cream Apply to affected area once 30 g 3  . metFORMIN (GLUCOPHAGE) 1000 MG tablet Take 1,000 mg by mouth 2 (two) times daily with a meal.    . metoprolol tartrate (LOPRESSOR) 25 MG tablet Take 0.5 tablets (12.5 mg total) by mouth 2 (two) times daily. 60 tablet 6  . Multiple Vitamins-Minerals (CENTRUM SILVER PO) Take 1 tablet by mouth daily.    Marland Kitchen omeprazole (PRILOSEC) 20 MG capsule Take 1 capsule (20 mg total) by mouth 2 (two) times daily before a meal. 180 capsule 3  . ondansetron (ZOFRAN) 8 MG tablet Take 1 tablet (8 mg total) by mouth 2 (two) times daily as needed (Nausea or vomiting). 30 tablet 3  . polyethylene glycol (MIRALAX / GLYCOLAX) packet Take 17 g by mouth daily. 14 each 0  . prochlorperazine (COMPAZINE) 10 MG tablet Take 1 tablet (10 mg total) by mouth every 6 (six) hours as needed (Nausea or vomiting). 30 tablet 3  . QUEtiapine (SEROQUEL) 50 MG tablet Take 1 tablet (50 mg total) by mouth at bedtime. 90 tablet 1  . tamsulosin (FLOMAX) 0.4 MG CAPS capsule Take 1 capsule (0.4 mg total) by mouth daily. 30 capsule 0   No current facility-administered medications for this visit.    Facility-Administered Medications Ordered in Other Visits  Medication Dose Route Frequency Provider Last Rate Last Dose  . 0.9 %  sodium chloride infusion   Intravenous Once Lloyd Huger, MD      . dexamethasone (DECADRON) injection 10 mg  10 mg  Intravenous Once Lloyd Huger, MD      . diphenhydrAMINE (BENADRYL) injection 25 mg  25 mg Intravenous Once Lloyd Huger, MD      . famotidine (PEPCID) IVPB 20 mg premix  20 mg Intravenous Once Lloyd Huger, MD      . heparin lock flush 100 unit/mL  500 Units Intravenous Once Lloyd Huger, MD      . PACLitaxel (TAXOL) 96 mg in dextrose 5 % 250 mL chemo infusion (</= 57m/m2)  40 mg/m2 (Treatment Plan Recorded) Intravenous Once FLloyd Huger MD      . sodium chloride flush (NS) 0.9 % injection 10 mL  10 mL Intravenous PRN FLloyd Huger MD   10 mL at 12/16/16 0942    OBJECTIVE: Vitals:   12/16/16 1000  BP: 102/68  Pulse: 96  Resp: 18  Temp: (!) 97.4 F (36.3 C)     Body mass index is 33.98 kg/m.    ECOG FS:0 - Asymptomatic  General: Well-developed, well-nourished, no acute distress. Eyes: Pink conjunctiva, anicteric sclera. HEENT: Normocephalic, moist mucous membranes, clear oropharnyx. Lungs: Clear to auscultation bilaterally. Heart: Regular rate and rhythm. No rubs, murmurs, or gallops. Abdomen: Soft, nontender, nondistended. No organomegaly noted, normoactive bowel sounds. Musculoskeletal: No edema, cyanosis, or clubbing. Neuro: Alert, answering all questions appropriately. Cranial nerves grossly intact. Skin: No rashes or petechiae noted. Psych: Normal affect.  LAB RESULTS:  Lab Results  Component Value Date   NA 135 12/16/2016   K 3.9 12/16/2016   CL 102 12/16/2016   CO2 26 12/16/2016   GLUCOSE 195 (H) 12/16/2016   BUN 24 (H) 12/16/2016   CREATININE 1.42 (H) 12/16/2016   CALCIUM 7.5 (L) 12/16/2016   PROT 6.9 12/16/2016   ALBUMIN 3.6 12/16/2016   AST 32 12/16/2016   ALT 16 (L) 12/16/2016   ALKPHOS 123 12/16/2016   BILITOT 1.0 12/16/2016   GFRNONAA 43 (L) 12/16/2016   GFRAA 50 (L) 12/16/2016    Lab Results  Component Value Date   WBC 3.9 12/16/2016   NEUTROABS 3.0 12/16/2016   HGB 12.7 (L) 12/16/2016   HCT 36.8 (L)  12/16/2016   MCV 93.8 12/16/2016   PLT 143 (L) 12/16/2016     STUDIES: Nm Pet Image Restag (ps) Skull Base To Thigh  Result Date: 12/08/2016 CLINICAL DATA:  Subsequent treatment strategy for squamous cell carcinoma of the esophagus. Additional history of right renal mass status post cryoablation. EXAM: NUCLEAR MEDICINE PET SKULL BASE TO THIGH TECHNIQUE: 11.7 mCi F-18 FDG was injected intravenously. Full-ring PET imaging was performed from the skull base to thigh after the radiotracer. CT data was obtained and used for attenuation correction and anatomic localization. FASTING BLOOD GLUCOSE:  Value: 101 mg/dl COMPARISON:  No prior PET-CT. Chest CT 06/03/2016. CT of the chest, abdomen and pelvis 05/12/2016. MRI of the abdomen 11/03/2016. FINDINGS: NECK: Small lymph nodes posterior to the jugular veins bilaterally at the level of the thoracic inlet are hypermetabolic, largest of which measures 11 mm in short axis on the right side (axial image 65 of series 3, SUVmax = 7.0). CHEST: There is a small focus of hypermetabolism in the superior aspect of the left hilar nodal station (SUVmax = 4.1), which appears to correspond to a small 8 mm short axis lymph node (axial image 93 of series 3). No other hypermetabolic mediastinal lymph nodes. No definite esophageal hypermetabolism or mass confidently identified. No suspicious pulmonary nodules on the CT scan. Atherosclerotic calcifications in the thoracic aorta, as well is a coronary arteries, including calcified atherosclerotic plaque in the left main, left anterior descending, left circumflex and right coronary arteries. Mild calcifications of the aortic valve. Right internal jugular single-lumen porta cath with tip terminating in the right atrium. ABDOMEN/PELVIS: Several areas of hypermetabolism in the liver, most hypermetabolic and largest of which corresponds to a hypoattenuating lesion which distorts the liver capsule in the inferior aspect of segment 6 (axial  image 185 of series 3, SUVmax = 12.1) which measures approximately 4.9 x 6.4 cm. Although comparison between modalities is challenging, these lesions do appear larger than prior MRI of the abdomen 11/03/2016. No abnormal hypermetabolic activity within the pancreas, adrenal glands, or spleen. Enlarged and hypermetabolic lymph nodes are noted in the portacaval nodal station (SUVmax = 10.1)and hepatoduodenal ligament nodal station (SUVmax = 9.1), measuring 17 mm and  15 mm in short axis respectively on images 161 and 156 of series 3. Multiple other smaller hypermetabolic retroperitoneal lymph nodes are also noted, largest of which is in the left para-aortic nodal station (SUVmax = 4.9) measuring 9 mm in short axis (axial image 192 of series 3). No hypermetabolic pelvic lymphadenopathy. Several calcified gallstones lie dependently in the gallbladder. No surrounding inflammatory changes to suggest an acute diverticulitis at this time. Multiple renal lesions are noted, incompletely characterized on today's noncontrast CT examination, largest of which measures 4 cm in the medial aspect of the interpolar region of the left kidney (see prior report for MRI of the abdomen 11/03/2016 for characterization of these lesions). Aortic atherosclerosis without definite aneurysm in the abdomen or pelvis. High attenuation (55 HU) fluid in the low anatomic pelvis is either very proteinaceous or hemorrhagic. No pneumoperitoneum. SKELETON: Multiple mixed lytic and sclerotic lesions are noted throughout the visualized axial and appendicular skeleton, the most of which demonstrate hypermetabolism, compatible with widespread metastatic disease to the bones. The largest of these is in the right ilium spanning much of the right iliac crest (SUVmax = 8.1). IMPRESSION: 1. Findings are compatible with widespread metastatic disease to the bones, liver and lymph nodes in the abdomen, thorax and lower neck, as detailed above. 2. No definite  esophageal mass or hypermetabolism identified on today's examination. 3. Small volume of high attenuation fluid in the low anatomic pelvis is either highly proteinaceous or hemorrhagic. This is of uncertain etiology and significance, but is new compared to prior CT the chest, abdomen and pelvis 05/12/2016. 4. Cholelithiasis without evidence of acute cholecystitis at this time. 5. Aortic atherosclerosis, in addition to left main and 3 vessel coronary artery disease. 6. Additional incidental findings, as above. Electronically Signed   By: Vinnie Langton M.D.   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 He has new hepatic lesions concerning for metastatic disease. He presents for ultrasound-guided biopsy of the same to confirm tissue diagnosis. EXAM: Ultrasound-guided biopsy of liver lesion MEDICATIONS: None. ANESTHESIA/SEDATION: Moderate (conscious) sedation was employed during this procedure. A total of Versed 2 mg and Fentanyl 100 mcg was administered intravenously. Moderate Sedation Time: 15 minutes. The patient's level of consciousness and vital signs were monitored continuously by radiology nursing throughout the procedure under my direct supervision. FLUOROSCOPY TIME:  Fluoroscopy Time: 0 minutes 0 seconds (0 mGy). COMPLICATIONS: None immediate. PROCEDURE: Informed written consent was obtained from the patient after a thorough discussion of the procedural risks, benefits and alternatives. All questions were addressed. A timeout was performed prior to the initiation of the procedure. The right upper quadrant was interrogated with ultrasound. A 1.6 x 1.1 cm lesion is successfully identified in hepatic segment 4 a. A 1.7 x 1.6 cm lesion is identified in the inferior aspect of hepatic segment 6. The hepatic segment 6 lesion will be targeted for biopsy. The  overlying skin was prepped and draped in the standard sterile fashion using chlorhexidine skin prep. Local anesthesia was attained by infiltration with 1% lidocaine. A small dermatotomy was made. Under real-time sonographic guidance, a 17 gauge introducer needle was advanced into the margin of the liver and directed to the periphery of the lesion. Multiple 18 gauge core biopsies were then coaxially obtained using the bio Pince automated biopsy device. Biopsy specimens were placed in formalin and delivered to pathology for  further analysis. As the introducer needle was removed, the biopsy tract was embolized with a Gel-Foam slurry. The patient tolerated the procedure well. There was no evidence of immediate complication. IMPRESSION: Technically successful ultrasound-guided core biopsy of lesion in hepatic segment 6. Electronically Signed   By: Jacqulynn Cadet M.D.   On: 11/24/2016 10:55    ONCOLOGY HISTORY: Initial diagnosis was in approximately October 2016. This was followed by concurrent chemotherapy with infusional 5-FU and XRT.  Patient received low-dose weekly carboplatinum and Taxol in approximately April and May 2017. He subsequently had a second recurrence and was initiated on Taxol 40 mg/m on days 1, 8, and 15 (Taxol was dose reduced 50% secondary to neuropathy) along with Ramucirumab 8 mg/m on days 1 and 15 starting September 27, 2015 through January 24, 2016. This was a 28 day cycle.   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 Recent biopsy of the lesion in his liver consistent with his original diagnosis. PET scan results from November 24, 2016 reviewed independently and reported as above with widespread metastatic disease in his liver, lymph nodes, and bones. After lengthy discussion with the patient, he wishes to pursue palliative treatment and will  use the same regimen he received in [place] listed above. Proceed with cycle 1, day 8 of Taxol 40 mg/m and Ramucirumab 8 mg/m today. Taxol only today. Patient will also receive Xgeva for his bony disease on day 1 of every cycle. Return to clinic in 1 week for consideration of cycle 1, day 15. Will reimage after cycle 3. 2. Neuropathy: Continue gabapentin and hydrocodone as prescribed. 3. Esophageal stricture: Patient has a history of multiple esophageal dilations. He reports he has a GI appointment in the next several weeks.  4. Right renal cell carcinoma: Patient had cryoablation performed on July 23, 2016. Repeat MRI November 03, 2016. Continue follow-up with interventional radiology as indicated. 5. Renal insufficiency: Patient's creatinine is approximately his baseline, monitor. 6. Bony metastatic disease: Xgeva as above. 7. Chest pain: Unclear etiology. Possibly related to his known esophageal stricture or bony disease. Patient also reports she has follow-up with cardiology in the next several weeks. 8. Hand pain: Seems to be related to poor circulation since it occurs in conjunction with his hands turning "white". Monitor, follow-up with cardiology as above.  Patient expressed understanding and was in agreement with this plan. He also understands that He can call clinic at any time with any questions, concerns, or complaints.   Cancer Staging Squamous cell esophageal cancer [facility]) Staging form: Esophagus - Squamous Cell Carcinoma, AJCC 7th Edition - Clinical stage from 02/27/2016: Stage IV (T3, N2, M1) - Signed by Lloyd Huger, MD on 12/02/2016   Lloyd Huger, MD   12/16/2016 10:54 AM

## 2016-12-12 NOTE — Telephone Encounter (Signed)
Called and spoke with Edsel Petrin at [facility] advised that we've received form and provider out of office.  Also spoke with Ulice Dash son and he will advise patient that will are working on completion of form.

## 2016-12-16 ENCOUNTER — Inpatient Hospital Stay: Payer: Medicare Other

## 2016-12-16 ENCOUNTER — Inpatient Hospital Stay: Payer: Medicare Other | Attending: Oncology | Admitting: Oncology

## 2016-12-16 VITALS — BP 102/68 | HR 96 | Temp 97.4°F | Resp 18 | Wt 243.6 lb

## 2016-12-16 DIAGNOSIS — R531 Weakness: Secondary | ICD-10-CM | POA: Diagnosis not present

## 2016-12-16 DIAGNOSIS — C787 Secondary malignant neoplasm of liver and intrahepatic bile duct: Secondary | ICD-10-CM | POA: Insufficient documentation

## 2016-12-16 DIAGNOSIS — R0602 Shortness of breath: Secondary | ICD-10-CM | POA: Diagnosis not present

## 2016-12-16 DIAGNOSIS — E785 Hyperlipidemia, unspecified: Secondary | ICD-10-CM

## 2016-12-16 DIAGNOSIS — G4700 Insomnia, unspecified: Secondary | ICD-10-CM | POA: Insufficient documentation

## 2016-12-16 DIAGNOSIS — Z7982 Long term (current) use of aspirin: Secondary | ICD-10-CM | POA: Insufficient documentation

## 2016-12-16 DIAGNOSIS — K222 Esophageal obstruction: Secondary | ICD-10-CM | POA: Diagnosis not present

## 2016-12-16 DIAGNOSIS — I4891 Unspecified atrial fibrillation: Secondary | ICD-10-CM | POA: Insufficient documentation

## 2016-12-16 DIAGNOSIS — R42 Dizziness and giddiness: Secondary | ICD-10-CM | POA: Diagnosis not present

## 2016-12-16 DIAGNOSIS — Z800 Family history of malignant neoplasm of digestive organs: Secondary | ICD-10-CM | POA: Insufficient documentation

## 2016-12-16 DIAGNOSIS — M79641 Pain in right hand: Secondary | ICD-10-CM | POA: Diagnosis not present

## 2016-12-16 DIAGNOSIS — F1020 Alcohol dependence, uncomplicated: Secondary | ICD-10-CM | POA: Insufficient documentation

## 2016-12-16 DIAGNOSIS — F0390 Unspecified dementia without behavioral disturbance: Secondary | ICD-10-CM | POA: Diagnosis not present

## 2016-12-16 DIAGNOSIS — C641 Malignant neoplasm of right kidney, except renal pelvis: Secondary | ICD-10-CM

## 2016-12-16 DIAGNOSIS — Z8041 Family history of malignant neoplasm of ovary: Secondary | ICD-10-CM | POA: Insufficient documentation

## 2016-12-16 DIAGNOSIS — I700 Atherosclerosis of aorta: Secondary | ICD-10-CM | POA: Diagnosis not present

## 2016-12-16 DIAGNOSIS — R630 Anorexia: Secondary | ICD-10-CM | POA: Insufficient documentation

## 2016-12-16 DIAGNOSIS — R5381 Other malaise: Secondary | ICD-10-CM | POA: Insufficient documentation

## 2016-12-16 DIAGNOSIS — C7951 Secondary malignant neoplasm of bone: Secondary | ICD-10-CM | POA: Diagnosis not present

## 2016-12-16 DIAGNOSIS — C159 Malignant neoplasm of esophagus, unspecified: Secondary | ICD-10-CM | POA: Insufficient documentation

## 2016-12-16 DIAGNOSIS — Z79899 Other long term (current) drug therapy: Secondary | ICD-10-CM

## 2016-12-16 DIAGNOSIS — H3530 Unspecified macular degeneration: Secondary | ICD-10-CM | POA: Insufficient documentation

## 2016-12-16 DIAGNOSIS — R5383 Other fatigue: Secondary | ICD-10-CM | POA: Diagnosis not present

## 2016-12-16 DIAGNOSIS — T451X5S Adverse effect of antineoplastic and immunosuppressive drugs, sequela: Secondary | ICD-10-CM | POA: Insufficient documentation

## 2016-12-16 DIAGNOSIS — K219 Gastro-esophageal reflux disease without esophagitis: Secondary | ICD-10-CM | POA: Insufficient documentation

## 2016-12-16 DIAGNOSIS — Z7984 Long term (current) use of oral hypoglycemic drugs: Secondary | ICD-10-CM | POA: Insufficient documentation

## 2016-12-16 DIAGNOSIS — J449 Chronic obstructive pulmonary disease, unspecified: Secondary | ICD-10-CM | POA: Insufficient documentation

## 2016-12-16 DIAGNOSIS — Z5111 Encounter for antineoplastic chemotherapy: Secondary | ICD-10-CM | POA: Insufficient documentation

## 2016-12-16 DIAGNOSIS — M79642 Pain in left hand: Secondary | ICD-10-CM | POA: Diagnosis not present

## 2016-12-16 DIAGNOSIS — R079 Chest pain, unspecified: Secondary | ICD-10-CM | POA: Diagnosis not present

## 2016-12-16 DIAGNOSIS — F419 Anxiety disorder, unspecified: Secondary | ICD-10-CM | POA: Insufficient documentation

## 2016-12-16 DIAGNOSIS — D701 Agranulocytosis secondary to cancer chemotherapy: Secondary | ICD-10-CM | POA: Insufficient documentation

## 2016-12-16 DIAGNOSIS — E119 Type 2 diabetes mellitus without complications: Secondary | ICD-10-CM

## 2016-12-16 DIAGNOSIS — I2510 Atherosclerotic heart disease of native coronary artery without angina pectoris: Secondary | ICD-10-CM | POA: Insufficient documentation

## 2016-12-16 DIAGNOSIS — Z87891 Personal history of nicotine dependence: Secondary | ICD-10-CM | POA: Insufficient documentation

## 2016-12-16 LAB — CBC WITH DIFFERENTIAL/PLATELET
Basophils Absolute: 0 10*3/uL (ref 0–0.1)
Basophils Relative: 1 %
Eosinophils Absolute: 0.2 10*3/uL (ref 0–0.7)
Eosinophils Relative: 5 %
HEMATOCRIT: 36.8 % — AB (ref 40.0–52.0)
Hemoglobin: 12.7 g/dL — ABNORMAL LOW (ref 13.0–18.0)
LYMPHS PCT: 14 %
Lymphs Abs: 0.6 10*3/uL — ABNORMAL LOW (ref 1.0–3.6)
MCH: 32.3 pg (ref 26.0–34.0)
MCHC: 34.5 g/dL (ref 32.0–36.0)
MCV: 93.8 fL (ref 80.0–100.0)
MONO ABS: 0.2 10*3/uL (ref 0.2–1.0)
Monocytes Relative: 4 %
NEUTROS ABS: 3 10*3/uL (ref 1.4–6.5)
Neutrophils Relative %: 76 %
Platelets: 143 10*3/uL — ABNORMAL LOW (ref 150–440)
RBC: 3.92 MIL/uL — ABNORMAL LOW (ref 4.40–5.90)
RDW: 15.8 % — AB (ref 11.5–14.5)
WBC: 3.9 10*3/uL (ref 3.8–10.6)

## 2016-12-16 LAB — COMPREHENSIVE METABOLIC PANEL
ALT: 16 U/L — ABNORMAL LOW (ref 17–63)
ANION GAP: 7 (ref 5–15)
AST: 32 U/L (ref 15–41)
Albumin: 3.6 g/dL (ref 3.5–5.0)
Alkaline Phosphatase: 123 U/L (ref 38–126)
BILIRUBIN TOTAL: 1 mg/dL (ref 0.3–1.2)
BUN: 24 mg/dL — ABNORMAL HIGH (ref 6–20)
CO2: 26 mmol/L (ref 22–32)
Calcium: 7.5 mg/dL — ABNORMAL LOW (ref 8.9–10.3)
Chloride: 102 mmol/L (ref 101–111)
Creatinine, Ser: 1.42 mg/dL — ABNORMAL HIGH (ref 0.61–1.24)
GFR calc Af Amer: 50 mL/min — ABNORMAL LOW (ref >60)
GFR, EST NON AFRICAN AMERICAN: 43 mL/min — AB (ref >60)
Glucose, Bld: 195 mg/dL — ABNORMAL HIGH (ref 65–99)
POTASSIUM: 3.9 mmol/L (ref 3.5–5.1)
Sodium: 135 mmol/L (ref 135–145)
TOTAL PROTEIN: 6.9 g/dL (ref 6.5–8.1)

## 2016-12-16 MED ORDER — DEXAMETHASONE SODIUM PHOSPHATE 10 MG/ML IJ SOLN
10.0000 mg | Freq: Once | INTRAMUSCULAR | Status: AC
  Administered 2016-12-16: 10 mg via INTRAVENOUS
  Filled 2016-12-16: qty 1

## 2016-12-16 MED ORDER — SODIUM CHLORIDE 0.9% FLUSH
10.0000 mL | INTRAVENOUS | Status: DC | PRN
  Administered 2016-12-16: 10 mL via INTRAVENOUS
  Filled 2016-12-16: qty 10

## 2016-12-16 MED ORDER — PACLITAXEL CHEMO INJECTION 300 MG/50ML
40.0000 mg/m2 | Freq: Once | INTRAVENOUS | Status: AC
  Administered 2016-12-16: 96 mg via INTRAVENOUS
  Filled 2016-12-16: qty 16

## 2016-12-16 MED ORDER — SODIUM CHLORIDE 0.9 % IV SOLN
Freq: Once | INTRAVENOUS | Status: AC
  Administered 2016-12-16: 11:00:00 via INTRAVENOUS
  Filled 2016-12-16: qty 1000

## 2016-12-16 MED ORDER — HEPARIN SOD (PORK) LOCK FLUSH 100 UNIT/ML IV SOLN
500.0000 [IU] | Freq: Once | INTRAVENOUS | Status: AC
  Administered 2016-12-16: 500 [IU] via INTRAVENOUS
  Filled 2016-12-16: qty 5

## 2016-12-16 MED ORDER — SODIUM CHLORIDE 0.9 % IV SOLN: 10.0000 mg | Freq: Once | INTRAVENOUS | Status: DC

## 2016-12-16 MED ORDER — DIPHENHYDRAMINE HCL 50 MG/ML IJ SOLN
25.0000 mg | Freq: Once | INTRAMUSCULAR | Status: AC
  Administered 2016-12-16: 25 mg via INTRAVENOUS
  Filled 2016-12-16: qty 1

## 2016-12-16 MED ORDER — FAMOTIDINE IN NACL 20-0.9 MG/50ML-% IV SOLN
20.0000 mg | Freq: Once | INTRAVENOUS | Status: AC
  Administered 2016-12-16: 20 mg via INTRAVENOUS

## 2016-12-16 NOTE — Progress Notes (Signed)
Patient is here for follow up, the family has some questions to ask today.

## 2016-12-18 ENCOUNTER — Ambulatory Visit (INDEPENDENT_AMBULATORY_CARE_PROVIDER_SITE_OTHER): Payer: Medicare Other | Admitting: Family Medicine

## 2016-12-18 ENCOUNTER — Encounter: Payer: Self-pay | Admitting: Family Medicine

## 2016-12-18 DIAGNOSIS — C159 Malignant neoplasm of esophagus, unspecified: Secondary | ICD-10-CM | POA: Diagnosis not present

## 2016-12-18 DIAGNOSIS — I5030 Unspecified diastolic (congestive) heart failure: Secondary | ICD-10-CM | POA: Diagnosis not present

## 2016-12-18 DIAGNOSIS — E1142 Type 2 diabetes mellitus with diabetic polyneuropathy: Secondary | ICD-10-CM | POA: Diagnosis not present

## 2016-12-18 DIAGNOSIS — J449 Chronic obstructive pulmonary disease, unspecified: Secondary | ICD-10-CM | POA: Diagnosis not present

## 2016-12-18 DIAGNOSIS — I4819 Other persistent atrial fibrillation: Secondary | ICD-10-CM

## 2016-12-18 DIAGNOSIS — Z23 Encounter for immunization: Secondary | ICD-10-CM

## 2016-12-18 DIAGNOSIS — I481 Persistent atrial fibrillation: Secondary | ICD-10-CM

## 2016-12-18 MED ORDER — FUROSEMIDE 20 MG PO TABS: 20.0000 mg | ORAL_TABLET | Freq: Every day | ORAL | 3 refills | Status: DC

## 2016-12-18 MED ORDER — GLUCOSE BLOOD VI STRP: ORAL_STRIP | 12 refills | Status: DC

## 2016-12-18 NOTE — Telephone Encounter (Signed)
Miguel [name] [phone] also called from [facility] regarding following up on Detailed product description which states final step only needing page 2 to be faxed back to [phone]. Client ID# [id number]. Need it to be signed and dated. Thank you!

## 2016-12-18 NOTE — Assessment & Plan Note (Signed)
Patient doing okay with chemotherapy. Patient feeling well currently. Advise close follow-up with heme/onc.

## 2016-12-18 NOTE — Assessment & Plan Note (Signed)
Stable. Continue metformin. Family reluctant to discontinue.

## 2016-12-18 NOTE — Assessment & Plan Note (Signed)
Stable.  Continue Advair. 

## 2016-12-18 NOTE — Telephone Encounter (Signed)
Faxed detail product description to [facility]

## 2016-12-18 NOTE — Telephone Encounter (Signed)
Miguel [name] [phone] called from [facility] regarding following up on the mobility exam if fax was received it was sent out on 09/4 and 12/17/2016. Client id# [id number] Please advise? Thank you!

## 2016-12-18 NOTE — Assessment & Plan Note (Signed)
Stable. Continue current meds.   

## 2016-12-18 NOTE — Assessment & Plan Note (Signed)
Stable. Continue rate control. Consider anticoagulation.

## 2016-12-18 NOTE — Patient Instructions (Signed)
Continue your meds.  Follow up closely with Dr. Grayland Ormond.  Follow up with Dr. Caryl Bis in 6 months.  Take care  Dr. Lacinda Axon

## 2016-12-18 NOTE — Progress Notes (Signed)
Subjective:  Patient ID: Miguel Kaufmann., male    DOB: 1929-11-09  Age: 83 y.o. MRN: [id number]  CC: Follow up  HPI:  83 year old male with an extensive past medical history including type 2 diabetes with peripheral neuropathy, COPD, metastatic esophageal cancer, dementia, A. fib, heart failure with preserved ejection fraction, OSA, CKD presents for follow-up.  Squamous cell esophageal cancer  Metastatic.  Patient undergoing chemotherapy at this time. Patient states that he's feeling well and doing okay with the treatment.  DM-2   Stable. At goal. Family and patient have been hesitant to stop metformin. He is currently tolerating metformin without difficulty.  HFpEF  Has been stable. He is currently on metoprolol and Lasix.  No reports of chest pain or increasing shortness of breath.  A. fib  Follows with cardiology.  Is not on anticoagulation due to family concern about gait instability and falls.  He is doing well on metoprolol at this time. Anticoagulation discussion will continue per cardiology.  COPD  Stable on Advair.  Social Hx   Social History   Social History  . Marital status: Married    Spouse name: N/A  . Number of children: N/A  . Years of education: N/A   Social History Main Topics  . Smoking status: Former Smoker    Types: Cigarettes    Quit date: 52  . Smokeless tobacco: Never Used  . Alcohol use No     Comment: quit 2012   . Drug use: No  . Sexual activity: Not Currently    Partners: Female   Other Topics Concern  . None   Social History Narrative  . None    Review of Systems  Respiratory: Negative.   Cardiovascular: Negative.    Objective:  BP 108/70 (BP Location: Left Arm, Patient Position: Sitting, Cuff Size: Normal)   Pulse 89   Temp 98.4 F (36.9 C) (Oral)   Wt 247 lb (112 kg)   SpO2 98%   BMI 34.45 kg/m   BP/Weight 12/18/2016 12/16/2016 7/98/9211  Systolic BP 941 740 814  Diastolic BP 70 68 75  Wt. (Lbs) 247  243.6 -  BMI 34.45 33.98 -    Physical Exam  Constitutional: He appears well-developed. No distress.  Cardiovascular: Normal rate and regular rhythm.   No LE edema.   Pulmonary/Chest: Effort normal. He has no wheezes. He has no rales.  Abdominal: Soft. He exhibits no distension. There is no tenderness. There is no rebound and no guarding.  Neurological: He is alert.  Psychiatric: He has a normal mood and affect.  Vitals reviewed.   Lab Results  Component Value Date   WBC 3.9 12/16/2016   HGB 12.7 (L) 12/16/2016   HCT 36.8 (L) 12/16/2016   PLT 143 (L) 12/16/2016   GLUCOSE 195 (H) 12/16/2016   CHOL 203 (H) 03/18/2016   TRIG 204.0 (H) 03/18/2016   HDL 48.40 03/18/2016   LDLDIRECT 108.0 03/18/2016   ALT 16 (L) 12/16/2016   AST 32 12/16/2016   NA 135 12/16/2016   K 3.9 12/16/2016   CL 102 12/16/2016   CREATININE 1.42 (H) 12/16/2016   BUN 24 (H) 12/16/2016   CO2 26 12/16/2016   TSH 1.986 06/03/2016   INR 1.06 11/24/2016   HGBA1C 5.6 11/26/2016    Assessment & Plan:   Problem List Items Addressed This Visit    Squamous cell esophageal cancer [facility])    Patient doing okay with chemotherapy. Patient feeling well currently. Advise close follow-up with  heme/onc.      DM type 2 with diabetic peripheral neuropathy ([facility])    Stable. Continue metformin. Family reluctant to discontinue.      COPD (chronic obstructive pulmonary disease) ([facility])    Stable. Continue Advair.      Atrial fibrillation ([facility])    Stable. Continue rate control. Consider anticoagulation.      Relevant Medications   furosemide (LASIX) 20 MG tablet   (HFpEF) heart failure with preserved ejection fraction ([facility])    Stable. Continue current meds.      Relevant Medications   furosemide (LASIX) 20 MG tablet    Other Visit Diagnoses    Encounter for immunization       Relevant Orders   Flu vaccine HIGH DOSE PF (Completed)      Meds ordered this encounter  Medications  . furosemide (LASIX) 20 MG  tablet    Sig: Take 1 tablet (20 mg total) by mouth daily.    Dispense:  90 tablet    Refill:  3  . glucose blood test strip    Sig: Use as instructed    Dispense:  100 each    Refill:  12    Per pts regular test strips he receives.     Follow-up: 6 months  [place] DO [facility]

## 2016-12-19 ENCOUNTER — Other Ambulatory Visit: Payer: Self-pay | Admitting: Oncology

## 2016-12-19 NOTE — Progress Notes (Signed)
[place]  Telephone:([phone] [phone]  ID: Miguel [name]. 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 He has a poor appetite and has increased denies anxiety with difficulty sleeping. He denies any further chest pain today. He otherwise feels well and is asymptomatic. He denies any pain. He denies any dysphagia or difficulty swallowing. He has no neurologic complaints, although admits to worsening memory. He denies any recent fevers or illnesses. He continues to have chronic shortness of breath. He denies any nausea, vomiting, constipation, or diarrhea. He has no urinary complaints. Patient offers no specific complaints today.  REVIEW OF SYSTEMS:   Review of Systems  Constitutional: Negative.  Negative for fever, malaise/fatigue and weight loss.  Respiratory: Positive for shortness of breath. Negative for cough.   Cardiovascular: Positive for chest pain. Negative for leg swelling.  Gastrointestinal: Negative.  Negative for abdominal pain, heartburn, nausea and vomiting.  Genitourinary: Negative.   Musculoskeletal: Negative.   Skin: Negative.  Negative for rash.  Neurological: Negative.  Negative for sensory change and weakness.  Psychiatric/Behavioral: Positive for memory loss. The patient is nervous/anxious and has insomnia.     As per HPI. Otherwise, a complete review of systems is negative.  PAST MEDICAL HISTORY: Past Medical History:  Diagnosis Date  . Alcoholism ([place])   . Anxiety   . COPD (chronic obstructive pulmonary disease) ([place])   . Dementia 03/18/2016  . DM type 2 with diabetic peripheral neuropathy ([place]) 03/18/2016  . Dyspnea      slight   . Dysrhythmia    atrial fib   . Esophageal cancer ([place])   . Esophageal stricture   . GERD (gastroesophageal reflux disease) 03/18/2016  . Hyperlipidemia   . Irregular heart rhythm   . Kidney carcinoma ([place])    right, sp ablation  . Macular degeneration   . Neuropathy   . Renal mass     PAST SURGICAL HISTORY: Past Surgical History:  Procedure Laterality Date  . BACK SURGERY    . CATARACT EXTRACTION    . CYSTOSCOPY W/ RETROGRADES Right 07/21/2016   Procedure: CYSTOSCOPY WITH RETROGRADE PYELOGRAM;  Surgeon: Hollice Espy, MD;  Location: [facility] ORS;  Service: Urology;  Laterality: Right;  . CYSTOSCOPY WITH STENT PLACEMENT Right 07/21/2016   Procedure: CYSTOSCOPY WITH STENT PLACEMENT;  Surgeon: Hollice Espy, MD;  Location: [facility] ORS;  Service: Urology;  Laterality: Right;  . IR RADIOLOGIST EVAL & MGMT  09/09/2016  . IR RADIOLOGIST EVAL & MGMT  06/19/2016  . KNEE SURGERY    . RADIOLOGY WITH ANESTHESIA N/A 07/23/2016   Procedure: RENAL CRYOABLATION;  Surgeon: Aletta Edouard, MD;  Location: [facility];  Service: Radiology;  Laterality: N/A;    FAMILY HISTORY: Family History  Problem Relation Age of Onset  . Ovarian cancer Sister   . Throat cancer Brother   . Diabetes Mother   . Kidney disease Father   . Prostate cancer Neg Hx   . Kidney cancer Neg Hx     ADVANCED DIRECTIVES (Y/N):  N  HEALTH MAINTENANCE: Social History  Substance Use Topics  . Smoking status: Former Smoker    Types: Cigarettes    Quit date: 60  . Smokeless tobacco: Never Used  .  Alcohol use No     Comment: quit 2012      Colonoscopy:  PAP:  Bone density:  Lipid panel:  No Known Allergies  Current Outpatient Prescriptions  Medication Sig Dispense Refill  . ADVAIR DISKUS 250-50 MCG/DOSE AEPB Inhale 1 puff into the lungs 2 (two) times daily. 180 each 3  . aspirin 81 MG chewable tablet Chew 1 tablet (81 mg total) by mouth daily. 30 tablet 5  . atorvastatin (LIPITOR) 40 MG tablet Take 1 tablet  (40 mg total) by mouth daily. 90 tablet 3  . donepezil (ARICEPT) 10 MG tablet TAKE 1 TABLET AT BEDTIME 90 tablet 1  . fesoterodine (TOVIAZ) 8 MG TB24 tablet Take 1 tablet (8 mg total) by mouth daily. 90 tablet 2  . furosemide (LASIX) 20 MG tablet Take 1 tablet (20 mg total) by mouth daily. 90 tablet 3  . glucose blood test strip Use as instructed 100 each 12  . ketoconazole (NIZORAL) 2 % cream Apply 1 application topically daily. 60 g 1  . lidocaine-prilocaine (EMLA) cream Apply to affected area once 30 g 3  . metFORMIN (GLUCOPHAGE) 1000 MG tablet Take 1,000 mg by mouth 2 (two) times daily with a meal.    . metoprolol tartrate (LOPRESSOR) 25 MG tablet Take 0.5 tablets (12.5 mg total) by mouth 2 (two) times daily. 60 tablet 6  . Multiple Vitamins-Minerals (CENTRUM SILVER PO) Take 1 tablet by mouth daily.    Marland Kitchen omeprazole (PRILOSEC) 20 MG capsule Take 1 capsule (20 mg total) by mouth 2 (two) times daily before a meal. 180 capsule 3  . ondansetron (ZOFRAN) 8 MG tablet Take 1 tablet (8 mg total) by mouth 2 (two) times daily as needed (Nausea or vomiting). 30 tablet 3  . polyethylene glycol (MIRALAX / GLYCOLAX) packet Take 17 g by mouth daily. 14 each 0  . prochlorperazine (COMPAZINE) 10 MG tablet Take 1 tablet (10 mg total) by mouth every 6 (six) hours as needed (Nausea or vomiting). 30 tablet 3  . QUEtiapine (SEROQUEL) 50 MG tablet Take 1 tablet (50 mg total) by mouth at bedtime. 90 tablet 1  . tamsulosin (FLOMAX) 0.4 MG CAPS capsule Take 1 capsule (0.4 mg total) by mouth daily. 30 capsule 0  . ALPRAZolam (XANAX) 0.5 MG tablet Take 1 tablet (0.5 mg total) by mouth 3 (three) times daily as needed for anxiety. 30 tablet 0  . megestrol (MEGACE) 40 MG tablet Take 1 tablet (40 mg total) by mouth daily. 30 tablet 2   No current facility-administered medications for this visit.     OBJECTIVE: Vitals:   12/23/16 1027  BP: 105/71  Pulse: 79  Resp: 20  Temp: (!) 97.3 F (36.3 C)     Body mass index  is 34.11 kg/m.    ECOG FS:0 - Asymptomatic  General: Well-developed, well-nourished, no acute distress. Eyes: Pink conjunctiva, anicteric sclera. HEENT: Normocephalic, moist mucous membranes, clear oropharnyx. Lungs: Clear to auscultation bilaterally. Heart: Regular rate and rhythm. No rubs, murmurs, or gallops. Abdomen: Soft, nontender, nondistended. No organomegaly noted, normoactive bowel sounds. Musculoskeletal: No edema, cyanosis, or clubbing. Neuro: Alert, answering all questions appropriately. Cranial nerves grossly intact. Skin: No rashes or petechiae noted. Psych: Normal affect.  LAB RESULTS:  Lab Results  Component Value Date   NA 134 (L) 12/23/2016   K 4.0 12/23/2016   CL 103 12/23/2016   CO2 24 12/23/2016   GLUCOSE 188 (H) 12/23/2016   BUN 19 12/23/2016   CREATININE 1.36 (H) 12/23/2016  CALCIUM 6.5 (L) 12/23/2016   PROT 6.4 (L) 12/23/2016   ALBUMIN 3.4 (L) 12/23/2016   AST 29 12/23/2016   ALT 16 (L) 12/23/2016   ALKPHOS 111 12/23/2016   BILITOT 1.0 12/23/2016   GFRNONAA 46 (L) 12/23/2016   GFRAA 53 (L) 12/23/2016    Lab Results  Component Value Date   WBC 1.0 (LL) 12/23/2016   NEUTROABS 0.5 (L) 12/23/2016   HGB 11.9 (L) 12/23/2016   HCT 34.5 (L) 12/23/2016   MCV 93.7 12/23/2016   PLT 146 (L) 12/23/2016     STUDIES: Nm Pet Image Restag (ps) Skull Base To Thigh  Result Date: 12/08/2016 CLINICAL DATA:  Subsequent treatment strategy for squamous cell carcinoma of the esophagus. Additional history of right renal mass status post cryoablation. EXAM: NUCLEAR MEDICINE PET SKULL BASE TO THIGH TECHNIQUE: 11.7 mCi F-18 FDG was injected intravenously. Full-ring PET imaging was performed from the skull base to thigh after the radiotracer. CT data was obtained and used for attenuation correction and anatomic localization. FASTING BLOOD GLUCOSE:  Value: 101 mg/dl COMPARISON:  No prior PET-CT. Chest CT 06/03/2016. CT of the chest, abdomen and pelvis 05/12/2016. MRI of  the abdomen 11/03/2016. FINDINGS: NECK: Small lymph nodes posterior to the jugular veins bilaterally at the level of the thoracic inlet are hypermetabolic, largest of which measures 11 mm in short axis on the right side (axial image 65 of series 3, SUVmax = 7.0). CHEST: There is a small focus of hypermetabolism in the superior aspect of the left hilar nodal station (SUVmax = 4.1), which appears to correspond to a small 8 mm short axis lymph node (axial image 93 of series 3). No other hypermetabolic mediastinal lymph nodes. No definite esophageal hypermetabolism or mass confidently identified. No suspicious pulmonary nodules on the CT scan. Atherosclerotic calcifications in the thoracic aorta, as well is a coronary arteries, including calcified atherosclerotic plaque in the left main, left anterior descending, left circumflex and right coronary arteries. Mild calcifications of the aortic valve. Right internal jugular single-lumen porta cath with tip terminating in the right atrium. ABDOMEN/PELVIS: Several areas of hypermetabolism in the liver, most hypermetabolic and largest of which corresponds to a hypoattenuating lesion which distorts the liver capsule in the inferior aspect of segment 6 (axial image 185 of series 3, SUVmax = 12.1) which measures approximately 4.9 x 6.4 cm. Although comparison between modalities is challenging, these lesions do appear larger than prior MRI of the abdomen 11/03/2016. No abnormal hypermetabolic activity within the pancreas, adrenal glands, or spleen. Enlarged and hypermetabolic lymph nodes are noted in the portacaval nodal station (SUVmax = 10.1)and hepatoduodenal ligament nodal station (SUVmax = 9.1), measuring 17 mm and 15 mm in short axis respectively on images 161 and 156 of series 3. Multiple other smaller hypermetabolic retroperitoneal lymph nodes are also noted, largest of which is in the left para-aortic nodal station (SUVmax = 4.9) measuring 9 mm in short axis (axial image  192 of series 3). No hypermetabolic pelvic lymphadenopathy. Several calcified gallstones lie dependently in the gallbladder. No surrounding inflammatory changes to suggest an acute diverticulitis at this time. Multiple renal lesions are noted, incompletely characterized on today's noncontrast CT examination, largest of which measures 4 cm in the medial aspect of the interpolar region of the left kidney (see prior report for MRI of the abdomen 11/03/2016 for characterization of these lesions). Aortic atherosclerosis without definite aneurysm in the abdomen or pelvis. High attenuation (55 HU) fluid in the low anatomic pelvis is either very proteinaceous  or hemorrhagic. No pneumoperitoneum. SKELETON: Multiple mixed lytic and sclerotic lesions are noted throughout the visualized axial and appendicular skeleton, the most of which demonstrate hypermetabolism, compatible with widespread metastatic disease to the bones. The largest of these is in the right ilium spanning much of the right iliac crest (SUVmax = 8.1). IMPRESSION: 1. Findings are compatible with widespread metastatic disease to the bones, liver and lymph nodes in the abdomen, thorax and lower neck, as detailed above. 2. No definite esophageal mass or hypermetabolism identified on today's examination. 3. Small volume of high attenuation fluid in the low anatomic pelvis is either highly proteinaceous or hemorrhagic. This is of uncertain etiology and significance, but is new compared to prior CT the chest, abdomen and pelvis 05/12/2016. 4. Cholelithiasis without evidence of acute cholecystitis at this time. 5. Aortic atherosclerosis, in addition to left main and 3 vessel coronary artery disease. 6. Additional incidental findings, as above. Electronically Signed   By: Vinnie Langton M.D.   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 He has new hepatic lesions concerning for metastatic disease. He presents for ultrasound-guided biopsy of the same to confirm tissue diagnosis. EXAM: Ultrasound-guided biopsy of liver lesion MEDICATIONS: None. ANESTHESIA/SEDATION: Moderate (conscious) sedation was employed during this procedure. A total of Versed 2 mg and Fentanyl 100 mcg was administered intravenously. Moderate Sedation Time: 15 minutes. The patient's level of consciousness and vital signs were monitored continuously by radiology nursing throughout the procedure under my direct supervision. FLUOROSCOPY TIME:  Fluoroscopy Time: 0 minutes 0 seconds (0 mGy). COMPLICATIONS: None immediate. PROCEDURE: Informed written consent was obtained from the patient after a thorough discussion of the procedural risks, benefits and alternatives. All questions were addressed. A timeout was performed prior to the initiation of the procedure. The right upper quadrant was interrogated with ultrasound. A 1.6 x 1.1 cm lesion is successfully identified in hepatic segment 4 a. A 1.7 x 1.6 cm lesion is identified in the inferior aspect of hepatic segment 6. The hepatic segment 6 lesion will be targeted for biopsy. The overlying skin was prepped and draped in the standard sterile fashion using chlorhexidine skin prep. Local anesthesia was attained by infiltration with 1% lidocaine. A small dermatotomy was made. Under real-time sonographic guidance, a 17 gauge introducer needle was advanced into the margin of the liver and directed to the periphery of the lesion. Multiple 18 gauge core biopsies were then coaxially obtained using the bio Pince automated biopsy device. Biopsy specimens were placed in formalin and delivered to pathology for further analysis. As the introducer needle was removed, the biopsy tract was embolized with a Gel-Foam slurry. The patient tolerated the procedure well. There was no evidence of immediate complication. IMPRESSION:  Technically successful ultrasound-guided core biopsy of lesion in hepatic segment 6. Electronically Signed   By: Jacqulynn Cadet M.D.   On: 11/24/2016 10:55    ONCOLOGY HISTORY: Initial diagnosis was in approximately October 2016. This was followed by concurrent chemotherapy with infusional 5-FU and XRT.  Patient received low-dose weekly carboplatinum and Taxol in approximately April and May 2017. He subsequently had a second recurrence and was initiated on Taxol 40 mg/m on days 1, 8, and 15 (Taxol was dose reduced 50% secondary to neuropathy) along with Ramucirumab 8 mg/m on days 1 and 15 starting September 27, 2015 through January 24, 2016. This was a  28 day cycle.   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 Recent biopsy of the lesion in his liver consistent with his original diagnosis. PET scan results from November 24, 2016 reviewed independently with widespread metastatic disease in his liver, lymph nodes, and bones. After lengthy discussion with the patient, he wishes to pursue palliative treatment and will use the same regimen he received in [place] listed above. Delay with cycle 1, day 15 of Taxol 40 mg/m and Ramucirumab 8 mg/m today secondary to neutropenia. Patient will also receive Xgeva for his bony disease on day 1 of every cycle. Return to clinic in 1 week for reconsideration of cycle 1, day 15. Will reimage after cycle 3. 2. Neuropathy: Continue gabapentin and hydrocodone as prescribed. 3. Esophageal stricture: Patient has a history of multiple esophageal dilations. He reports he has a GI appointment in the next several weeks.  4. Right renal cell carcinoma: Patient had cryoablation performed on July 23, 2016. Repeat MRI November 03, 2016. Continue follow-up with interventional radiology as indicated. 5. Renal insufficiency: Patient's creatinine  is approximately his baseline, monitor. 6. Bony metastatic disease: Xgeva as above. 7. Chest pain: Patient does not complain of this today. Possibly related to his known esophageal stricture or bony disease. Patient also reports she has follow-up with cardiology in the next several weeks. 8. Hand pain: Seems to be related to poor circulation since it occurs in conjunction with his hands turning "white". Monitor, follow-up with cardiology as above. 9. Poor appetite: Patient was given a prescription for Megace today. 10. Anxiety/insomnia: Patient was given a prescription for Xanax.  Patient expressed understanding and was in agreement with this plan. He also understands that He can call clinic at any time with any questions, concerns, or complaints.   Cancer Staging Squamous cell esophageal cancer [facility]) Staging form: Esophagus - Squamous Cell Carcinoma, AJCC 7th Edition - Clinical stage from 02/27/2016: Stage IV (T3, N2, M1) - Signed by Lloyd Huger, MD on 12/02/2016   Lloyd Huger, MD   12/23/2016 1:57 PM

## 2016-12-23 ENCOUNTER — Inpatient Hospital Stay: Payer: Medicare Other

## 2016-12-23 ENCOUNTER — Inpatient Hospital Stay (HOSPITAL_BASED_OUTPATIENT_CLINIC_OR_DEPARTMENT_OTHER): Payer: Medicare Other | Admitting: Oncology

## 2016-12-23 VITALS — BP 105/71 | HR 79 | Temp 97.3°F | Resp 20 | Wt 244.6 lb

## 2016-12-23 DIAGNOSIS — Z7982 Long term (current) use of aspirin: Secondary | ICD-10-CM

## 2016-12-23 DIAGNOSIS — J449 Chronic obstructive pulmonary disease, unspecified: Secondary | ICD-10-CM

## 2016-12-23 DIAGNOSIS — F419 Anxiety disorder, unspecified: Secondary | ICD-10-CM | POA: Diagnosis not present

## 2016-12-23 DIAGNOSIS — I700 Atherosclerosis of aorta: Secondary | ICD-10-CM

## 2016-12-23 DIAGNOSIS — K222 Esophageal obstruction: Secondary | ICD-10-CM

## 2016-12-23 DIAGNOSIS — I4891 Unspecified atrial fibrillation: Secondary | ICD-10-CM

## 2016-12-23 DIAGNOSIS — C787 Secondary malignant neoplasm of liver and intrahepatic bile duct: Secondary | ICD-10-CM | POA: Diagnosis not present

## 2016-12-23 DIAGNOSIS — Z800 Family history of malignant neoplasm of digestive organs: Secondary | ICD-10-CM

## 2016-12-23 DIAGNOSIS — Z7984 Long term (current) use of oral hypoglycemic drugs: Secondary | ICD-10-CM

## 2016-12-23 DIAGNOSIS — C159 Malignant neoplasm of esophagus, unspecified: Secondary | ICD-10-CM

## 2016-12-23 DIAGNOSIS — Z5111 Encounter for antineoplastic chemotherapy: Secondary | ICD-10-CM | POA: Diagnosis not present

## 2016-12-23 DIAGNOSIS — Z79899 Other long term (current) drug therapy: Secondary | ICD-10-CM

## 2016-12-23 DIAGNOSIS — R0602 Shortness of breath: Secondary | ICD-10-CM

## 2016-12-23 DIAGNOSIS — G4700 Insomnia, unspecified: Secondary | ICD-10-CM | POA: Diagnosis not present

## 2016-12-23 DIAGNOSIS — R630 Anorexia: Secondary | ICD-10-CM

## 2016-12-23 DIAGNOSIS — Z8041 Family history of malignant neoplasm of ovary: Secondary | ICD-10-CM

## 2016-12-23 DIAGNOSIS — Z87891 Personal history of nicotine dependence: Secondary | ICD-10-CM

## 2016-12-23 DIAGNOSIS — M79641 Pain in right hand: Secondary | ICD-10-CM

## 2016-12-23 DIAGNOSIS — F0390 Unspecified dementia without behavioral disturbance: Secondary | ICD-10-CM

## 2016-12-23 DIAGNOSIS — C641 Malignant neoplasm of right kidney, except renal pelvis: Secondary | ICD-10-CM | POA: Diagnosis not present

## 2016-12-23 DIAGNOSIS — H3530 Unspecified macular degeneration: Secondary | ICD-10-CM

## 2016-12-23 DIAGNOSIS — I2510 Atherosclerotic heart disease of native coronary artery without angina pectoris: Secondary | ICD-10-CM

## 2016-12-23 DIAGNOSIS — C7951 Secondary malignant neoplasm of bone: Secondary | ICD-10-CM | POA: Diagnosis not present

## 2016-12-23 DIAGNOSIS — Z95828 Presence of other vascular implants and grafts: Secondary | ICD-10-CM

## 2016-12-23 DIAGNOSIS — E785 Hyperlipidemia, unspecified: Secondary | ICD-10-CM

## 2016-12-23 DIAGNOSIS — M79642 Pain in left hand: Secondary | ICD-10-CM | POA: Diagnosis not present

## 2016-12-23 DIAGNOSIS — E119 Type 2 diabetes mellitus without complications: Secondary | ICD-10-CM

## 2016-12-23 DIAGNOSIS — K219 Gastro-esophageal reflux disease without esophagitis: Secondary | ICD-10-CM

## 2016-12-23 LAB — CBC WITH DIFFERENTIAL/PLATELET
BASOS ABS: 0 10*3/uL (ref 0–0.1)
BASOS PCT: 2 %
EOS PCT: 4 %
Eosinophils Absolute: 0 10*3/uL (ref 0–0.7)
HCT: 34.5 % — ABNORMAL LOW (ref 40.0–52.0)
Hemoglobin: 11.9 g/dL — ABNORMAL LOW (ref 13.0–18.0)
LYMPHS ABS: 0.3 10*3/uL — AB (ref 1.0–3.6)
Lymphocytes Relative: 32 %
MCH: 32.3 pg (ref 26.0–34.0)
MCHC: 34.5 g/dL (ref 32.0–36.0)
MCV: 93.7 fL (ref 80.0–100.0)
MONO ABS: 0.1 10*3/uL — AB (ref 0.2–1.0)
MONOS PCT: 10 %
Neutro Abs: 0.5 10*3/uL — ABNORMAL LOW (ref 1.4–6.5)
Neutrophils Relative %: 52 %
PLATELETS: 146 10*3/uL — AB (ref 150–440)
RBC: 3.68 MIL/uL — ABNORMAL LOW (ref 4.40–5.90)
RDW: 15.6 % — AB (ref 11.5–14.5)
WBC: 1 10*3/uL — CL (ref 3.8–10.6)

## 2016-12-23 LAB — COMPREHENSIVE METABOLIC PANEL
ALBUMIN: 3.4 g/dL — AB (ref 3.5–5.0)
ALT: 16 U/L — ABNORMAL LOW (ref 17–63)
AST: 29 U/L (ref 15–41)
Alkaline Phosphatase: 111 U/L (ref 38–126)
Anion gap: 7 (ref 5–15)
BUN: 19 mg/dL (ref 6–20)
CHLORIDE: 103 mmol/L (ref 101–111)
CO2: 24 mmol/L (ref 22–32)
Calcium: 6.5 mg/dL — ABNORMAL LOW (ref 8.9–10.3)
Creatinine, Ser: 1.36 mg/dL — ABNORMAL HIGH (ref 0.61–1.24)
GFR calc Af Amer: 53 mL/min — ABNORMAL LOW (ref >60)
GFR calc non Af Amer: 46 mL/min — ABNORMAL LOW (ref >60)
GLUCOSE: 188 mg/dL — AB (ref 65–99)
POTASSIUM: 4 mmol/L (ref 3.5–5.1)
SODIUM: 134 mmol/L — AB (ref 135–145)
Total Bilirubin: 1 mg/dL (ref 0.3–1.2)
Total Protein: 6.4 g/dL — ABNORMAL LOW (ref 6.5–8.1)

## 2016-12-23 MED ORDER — MEGESTROL ACETATE 40 MG PO TABS: 40.0000 mg | ORAL_TABLET | Freq: Every day | ORAL | 2 refills | Status: DC

## 2016-12-23 MED ORDER — HEPARIN SOD (PORK) LOCK FLUSH 100 UNIT/ML IV SOLN
500.0000 [IU] | Freq: Once | INTRAVENOUS | Status: AC
  Administered 2016-12-23: 500 [IU] via INTRAVENOUS

## 2016-12-23 MED ORDER — HEPARIN SOD (PORK) LOCK FLUSH 100 UNIT/ML IV SOLN
INTRAVENOUS | Status: AC
  Filled 2016-12-23: qty 5

## 2016-12-23 MED ORDER — ALPRAZOLAM 0.5 MG PO TABS: 0.5000 mg | ORAL_TABLET | Freq: Three times a day (TID) | ORAL | 0 refills | Status: DC | PRN

## 2016-12-23 NOTE — Progress Notes (Signed)
Patient reports decreased appetite and difficulty sleeping, requesting refill for xanax.

## 2016-12-26 ENCOUNTER — Other Ambulatory Visit: Payer: Self-pay | Admitting: Oncology

## 2016-12-26 DIAGNOSIS — Z7189 Other specified counseling: Secondary | ICD-10-CM

## 2016-12-27 NOTE — Progress Notes (Signed)
[place]  Telephone:([phone] [phone]  ID: Miguel [name]. 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 His appetite has improved. He denies any further chest pain today. He otherwise feels well and is asymptomatic. He denies any pain. He denies any dysphagia or difficulty swallowing. He has no neurologic complaints, although admits to worsening memory. He denies any recent fevers or illnesses. He continues to have chronic shortness of breath. He denies any nausea, vomiting, constipation, or diarrhea. He has no urinary complaints. Patient offers no further specific complaints today.  REVIEW OF SYSTEMS:   Review of Systems  Constitutional: Negative.  Negative for fever, malaise/fatigue and weight loss.  Respiratory: Positive for shortness of breath. Negative for cough.   Cardiovascular: Negative for chest pain and leg swelling.  Gastrointestinal: Negative.  Negative for abdominal pain, heartburn, nausea and vomiting.  Genitourinary: Negative.   Musculoskeletal: Negative.   Skin: Negative.  Negative for rash.  Neurological: Negative.  Negative for sensory change and weakness.  Psychiatric/Behavioral: Positive for memory loss. The patient is nervous/anxious. The patient does not have insomnia.     As per HPI. Otherwise, a complete review of systems is negative.  PAST MEDICAL HISTORY: Past Medical History:  Diagnosis Date  . Alcoholism ([place])   . Anxiety   . COPD (chronic obstructive pulmonary disease) ([place])   . Dementia 03/18/2016  . DM type 2 with diabetic peripheral neuropathy ([place]) 03/18/2016  . Dyspnea    slight   . Dysrhythmia    atrial  fib   . Esophageal cancer ([place])   . Esophageal stricture   . GERD (gastroesophageal reflux disease) 03/18/2016  . Hyperlipidemia   . Irregular heart rhythm   . Kidney carcinoma ([place])    right, sp ablation  . Macular degeneration   . Neuropathy   . Renal mass     PAST SURGICAL HISTORY: Past Surgical History:  Procedure Laterality Date  . BACK SURGERY    . CATARACT EXTRACTION    . CYSTOSCOPY W/ RETROGRADES Right 07/21/2016   Procedure: CYSTOSCOPY WITH RETROGRADE PYELOGRAM;  Surgeon: Hollice Espy, MD;  Location: [facility] ORS;  Service: Urology;  Laterality: Right;  . CYSTOSCOPY WITH STENT PLACEMENT Right 07/21/2016   Procedure: CYSTOSCOPY WITH STENT PLACEMENT;  Surgeon: Hollice Espy, MD;  Location: [facility] ORS;  Service: Urology;  Laterality: Right;  . IR RADIOLOGIST EVAL & MGMT  09/09/2016  . IR RADIOLOGIST EVAL & MGMT  06/19/2016  . KNEE SURGERY    . RADIOLOGY WITH ANESTHESIA N/A 07/23/2016   Procedure: RENAL CRYOABLATION;  Surgeon: Aletta Edouard, MD;  Location: [facility];  Service: Radiology;  Laterality: N/A;    FAMILY HISTORY: Family History  Problem Relation Age of Onset  . Ovarian cancer Sister   . Throat cancer Brother   . Diabetes Mother   . Kidney disease Father   . Prostate cancer Neg Hx   . Kidney cancer Neg Hx     ADVANCED DIRECTIVES (Y/N):  N  HEALTH MAINTENANCE: Social History  Substance Use Topics  . Smoking status: Former Smoker    Types: Cigarettes    Quit date: 74  . Smokeless tobacco: Never Used  . Alcohol use No  Comment: quit 2012      Colonoscopy:  PAP:  Bone density:  Lipid panel:  No Known Allergies  Current Outpatient Prescriptions  Medication Sig Dispense Refill  . ADVAIR DISKUS 250-50 MCG/DOSE AEPB Inhale 1 puff into the lungs 2 (two) times daily. 180 each 3  . ALPRAZolam (XANAX) 0.5 MG tablet Take 1 tablet (0.5 mg total) by mouth 3 (three) times daily as needed for anxiety. 30 tablet 0  . aspirin 81 MG chewable tablet Chew 1 tablet (81  mg total) by mouth daily. 30 tablet 5  . atorvastatin (LIPITOR) 40 MG tablet Take 1 tablet (40 mg total) by mouth daily. 90 tablet 3  . donepezil (ARICEPT) 10 MG tablet TAKE 1 TABLET AT BEDTIME 90 tablet 1  . fesoterodine (TOVIAZ) 8 MG TB24 tablet Take 1 tablet (8 mg total) by mouth daily. 90 tablet 2  . furosemide (LASIX) 20 MG tablet Take 1 tablet (20 mg total) by mouth daily. 90 tablet 3  . glucose blood test strip Use as instructed 100 each 12  . ketoconazole (NIZORAL) 2 % cream Apply 1 application topically daily. 60 g 1  . lidocaine-prilocaine (EMLA) cream Apply to affected area once 30 g 3  . megestrol (MEGACE) 40 MG tablet Take 1 tablet (40 mg total) by mouth daily. 30 tablet 2  . metFORMIN (GLUCOPHAGE) 1000 MG tablet Take 1,000 mg by mouth 2 (two) times daily with a meal.    . metoprolol tartrate (LOPRESSOR) 25 MG tablet Take 0.5 tablets (12.5 mg total) by mouth 2 (two) times daily. 60 tablet 6  . Multiple Vitamins-Minerals (CENTRUM SILVER PO) Take 1 tablet by mouth daily.    Marland Kitchen omeprazole (PRILOSEC) 20 MG capsule Take 1 capsule (20 mg total) by mouth 2 (two) times daily before a meal. 180 capsule 3  . ondansetron (ZOFRAN) 8 MG tablet Take 1 tablet (8 mg total) by mouth 2 (two) times daily as needed (Nausea or vomiting). 30 tablet 3  . polyethylene glycol (MIRALAX / GLYCOLAX) packet Take 17 g by mouth daily. 14 each 0  . prochlorperazine (COMPAZINE) 10 MG tablet Take 1 tablet (10 mg total) by mouth every 6 (six) hours as needed (Nausea or vomiting). 30 tablet 3  . QUEtiapine (SEROQUEL) 50 MG tablet Take 1 tablet (50 mg total) by mouth at bedtime. 90 tablet 1   No current facility-administered medications for this visit.     OBJECTIVE: Vitals:   12/30/16 1000  BP: 93/64  Pulse: 94  Resp: 18  Temp: 98.1 F (36.7 C)     Body mass index is 34.55 kg/m.    ECOG FS:0 - Asymptomatic  General: Well-developed, well-nourished, no acute distress. Eyes: Pink conjunctiva, anicteric  sclera. HEENT: Normocephalic, moist mucous membranes, clear oropharnyx. Lungs: Clear to auscultation bilaterally. Heart: Regular rate and rhythm. No rubs, murmurs, or gallops. Abdomen: Soft, nontender, nondistended. No organomegaly noted, normoactive bowel sounds. Musculoskeletal: No edema, cyanosis, or clubbing. Neuro: Alert, answering all questions appropriately. Cranial nerves grossly intact. Skin: No rashes or petechiae noted. Psych: Normal affect.  LAB RESULTS:  Lab Results  Component Value Date   NA 136 12/30/2016   K 3.9 12/30/2016   CL 106 12/30/2016   CO2 22 12/30/2016   GLUCOSE 207 (H) 12/30/2016   BUN 16 12/30/2016   CREATININE 1.44 (H) 12/30/2016   CALCIUM 6.0 (LL) 12/30/2016   PROT 6.4 (L) 12/30/2016   ALBUMIN 3.4 (L) 12/30/2016   AST 33 12/30/2016   ALT 16 (L) 12/30/2016  ALKPHOS 117 12/30/2016   BILITOT 0.8 12/30/2016   GFRNONAA 43 (L) 12/30/2016   GFRAA 50 (L) 12/30/2016    Lab Results  Component Value Date   WBC 2.4 (L) 12/30/2016   NEUTROABS 1.2 (L) 12/30/2016   HGB 12.4 (L) 12/30/2016   HCT 36.1 (L) 12/30/2016   MCV 92.7 12/30/2016   PLT 210 12/30/2016     STUDIES: Nm Pet Image Restag (ps) Skull Base To Thigh  Result Date: 12/08/2016 CLINICAL DATA:  Subsequent treatment strategy for squamous cell carcinoma of the esophagus. Additional history of right renal mass status post cryoablation. EXAM: NUCLEAR MEDICINE PET SKULL BASE TO THIGH TECHNIQUE: 11.7 mCi F-18 FDG was injected intravenously. Full-ring PET imaging was performed from the skull base to thigh after the radiotracer. CT data was obtained and used for attenuation correction and anatomic localization. FASTING BLOOD GLUCOSE:  Value: 101 mg/dl COMPARISON:  No prior PET-CT. Chest CT 06/03/2016. CT of the chest, abdomen and pelvis 05/12/2016. MRI of the abdomen 11/03/2016. FINDINGS: NECK: Small lymph nodes posterior to the jugular veins bilaterally at the level of the thoracic inlet are  hypermetabolic, largest of which measures 11 mm in short axis on the right side (axial image 65 of series 3, SUVmax = 7.0). CHEST: There is a small focus of hypermetabolism in the superior aspect of the left hilar nodal station (SUVmax = 4.1), which appears to correspond to a small 8 mm short axis lymph node (axial image 93 of series 3). No other hypermetabolic mediastinal lymph nodes. No definite esophageal hypermetabolism or mass confidently identified. No suspicious pulmonary nodules on the CT scan. Atherosclerotic calcifications in the thoracic aorta, as well is a coronary arteries, including calcified atherosclerotic plaque in the left main, left anterior descending, left circumflex and right coronary arteries. Mild calcifications of the aortic valve. Right internal jugular single-lumen porta cath with tip terminating in the right atrium. ABDOMEN/PELVIS: Several areas of hypermetabolism in the liver, most hypermetabolic and largest of which corresponds to a hypoattenuating lesion which distorts the liver capsule in the inferior aspect of segment 6 (axial image 185 of series 3, SUVmax = 12.1) which measures approximately 4.9 x 6.4 cm. Although comparison between modalities is challenging, these lesions do appear larger than prior MRI of the abdomen 11/03/2016. No abnormal hypermetabolic activity within the pancreas, adrenal glands, or spleen. Enlarged and hypermetabolic lymph nodes are noted in the portacaval nodal station (SUVmax = 10.1)and hepatoduodenal ligament nodal station (SUVmax = 9.1), measuring 17 mm and 15 mm in short axis respectively on images 161 and 156 of series 3. Multiple other smaller hypermetabolic retroperitoneal lymph nodes are also noted, largest of which is in the left para-aortic nodal station (SUVmax = 4.9) measuring 9 mm in short axis (axial image 192 of series 3). No hypermetabolic pelvic lymphadenopathy. Several calcified gallstones lie dependently in the gallbladder. No surrounding  inflammatory changes to suggest an acute diverticulitis at this time. Multiple renal lesions are noted, incompletely characterized on today's noncontrast CT examination, largest of which measures 4 cm in the medial aspect of the interpolar region of the left kidney (see prior report for MRI of the abdomen 11/03/2016 for characterization of these lesions). Aortic atherosclerosis without definite aneurysm in the abdomen or pelvis. High attenuation (55 HU) fluid in the low anatomic pelvis is either very proteinaceous or hemorrhagic. No pneumoperitoneum. SKELETON: Multiple mixed lytic and sclerotic lesions are noted throughout the visualized axial and appendicular skeleton, the most of which demonstrate hypermetabolism, compatible with widespread metastatic  disease to the bones. The largest of these is in the right ilium spanning much of the right iliac crest (SUVmax = 8.1). IMPRESSION: 1. Findings are compatible with widespread metastatic disease to the bones, liver and lymph nodes in the abdomen, thorax and lower neck, as detailed above. 2. No definite esophageal mass or hypermetabolism identified on today's examination. 3. Small volume of high attenuation fluid in the low anatomic pelvis is either highly proteinaceous or hemorrhagic. This is of uncertain etiology and significance, but is new compared to prior CT the chest, abdomen and pelvis 05/12/2016. 4. Cholelithiasis without evidence of acute cholecystitis at this time. 5. Aortic atherosclerosis, in addition to left main and 3 vessel coronary artery disease. 6. Additional incidental findings, as above. Electronically Signed   By: Vinnie Langton M.D.   On: 12/08/2016 10:36    ONCOLOGY HISTORY: Initial diagnosis was in approximately October 2016. This was followed by concurrent chemotherapy with infusional 5-FU and XRT.  Patient received low-dose weekly carboplatinum and Taxol in approximately April and May 2017. He subsequently had a second recurrence and was  initiated on Taxol 40 mg/m on days 1, 8, and 15 (Taxol was dose reduced 50% secondary to neuropathy) along with Ramucirumab 8 mg/m on days 1 and 15 starting September 27, 2015 through January 24, 2016. This was a 28 day cycle.   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 Recent biopsy of the lesion in his liver consistent with his original diagnosis. PET scan results from November 24, 2016 reviewed independently with widespread metastatic disease in his liver, lymph nodes, and bones. After lengthy discussion with the patient, he wishes to pursue palliative treatment and will use the same regimen he received in [place] listed above. Proceed with cycle 1, day 15 of Taxol 40 mg/m only today. Given patient's persistent neutropenia, may have to proceed with single agent treatment and discontinue Ramucirumab. Patient will also receive Xgeva for his bony disease on day 1 of every cycle. Return to clinic in 1 week for laboratory work and further evaluation. We will consider cycle 2, day 1 and 2 weeks. Will reimage after cycle 3. 2. Neuropathy: Continue gabapentin and hydrocodone as prescribed. 3. Esophageal stricture: Patient has a history of multiple esophageal dilations. He reports he has a GI appointment in the next several weeks.  4. Right renal cell carcinoma: Patient had cryoablation performed on July 23, 2016. Repeat MRI November 03, 2016. Continue follow-up with interventional radiology as indicated. 5. Renal insufficiency: Patient's creatinine is approximately his baseline, monitor. 6. Bony metastatic disease: Xgeva as above. 7. Chest pain: Patient does not complain of this today. Possibly related to his known esophageal stricture or bony disease. Patient also reports she has follow-up with cardiology in the next several weeks. 8. Poor appetite: Continue  Megace. 9. Anxiety/insomnia: Continue Xanax. 10. Hypocalcemia: Patient will receive 2 g calcium gluconate today. He was also instructed to initiate oral calcium supplementation. 11. Neutropenia: Proceed with single agent Taxol as above.   Patient expressed understanding and was in agreement with this plan. He also understands that He can call clinic at any time with any questions, concerns, or complaints.   Cancer Staging Squamous cell esophageal cancer [facility]) Staging form: Esophagus - Squamous Cell Carcinoma, AJCC 7th Edition - Clinical stage from 02/27/2016: Stage IV (T3, N2, M1) - Signed by Lloyd Huger,  MD on 12/02/2016   Lloyd Huger, MD   12/31/2016 5:06 PM

## 2016-12-30 ENCOUNTER — Inpatient Hospital Stay: Payer: Medicare Other

## 2016-12-30 ENCOUNTER — Ambulatory Visit: Payer: Medicare Other | Admitting: Internal Medicine

## 2016-12-30 ENCOUNTER — Inpatient Hospital Stay (HOSPITAL_BASED_OUTPATIENT_CLINIC_OR_DEPARTMENT_OTHER): Payer: Medicare Other | Admitting: Oncology

## 2016-12-30 VITALS — BP 93/64 | HR 94 | Temp 98.1°F | Resp 18 | Wt 247.7 lb

## 2016-12-30 DIAGNOSIS — K219 Gastro-esophageal reflux disease without esophagitis: Secondary | ICD-10-CM

## 2016-12-30 DIAGNOSIS — Z5111 Encounter for antineoplastic chemotherapy: Secondary | ICD-10-CM | POA: Diagnosis not present

## 2016-12-30 DIAGNOSIS — C641 Malignant neoplasm of right kidney, except renal pelvis: Secondary | ICD-10-CM | POA: Diagnosis not present

## 2016-12-30 DIAGNOSIS — C159 Malignant neoplasm of esophagus, unspecified: Secondary | ICD-10-CM

## 2016-12-30 DIAGNOSIS — I2510 Atherosclerotic heart disease of native coronary artery without angina pectoris: Secondary | ICD-10-CM

## 2016-12-30 DIAGNOSIS — Z79899 Other long term (current) drug therapy: Secondary | ICD-10-CM

## 2016-12-30 DIAGNOSIS — R079 Chest pain, unspecified: Secondary | ICD-10-CM

## 2016-12-30 DIAGNOSIS — T451X5S Adverse effect of antineoplastic and immunosuppressive drugs, sequela: Secondary | ICD-10-CM | POA: Diagnosis not present

## 2016-12-30 DIAGNOSIS — C787 Secondary malignant neoplasm of liver and intrahepatic bile duct: Secondary | ICD-10-CM

## 2016-12-30 DIAGNOSIS — K222 Esophageal obstruction: Secondary | ICD-10-CM | POA: Diagnosis not present

## 2016-12-30 DIAGNOSIS — Z800 Family history of malignant neoplasm of digestive organs: Secondary | ICD-10-CM

## 2016-12-30 DIAGNOSIS — J449 Chronic obstructive pulmonary disease, unspecified: Secondary | ICD-10-CM

## 2016-12-30 DIAGNOSIS — G4700 Insomnia, unspecified: Secondary | ICD-10-CM

## 2016-12-30 DIAGNOSIS — Z7984 Long term (current) use of oral hypoglycemic drugs: Secondary | ICD-10-CM

## 2016-12-30 DIAGNOSIS — Z7982 Long term (current) use of aspirin: Secondary | ICD-10-CM

## 2016-12-30 DIAGNOSIS — E785 Hyperlipidemia, unspecified: Secondary | ICD-10-CM

## 2016-12-30 DIAGNOSIS — F0390 Unspecified dementia without behavioral disturbance: Secondary | ICD-10-CM

## 2016-12-30 DIAGNOSIS — C7951 Secondary malignant neoplasm of bone: Secondary | ICD-10-CM | POA: Diagnosis not present

## 2016-12-30 DIAGNOSIS — R630 Anorexia: Secondary | ICD-10-CM | POA: Diagnosis not present

## 2016-12-30 DIAGNOSIS — H3530 Unspecified macular degeneration: Secondary | ICD-10-CM

## 2016-12-30 DIAGNOSIS — E119 Type 2 diabetes mellitus without complications: Secondary | ICD-10-CM

## 2016-12-30 DIAGNOSIS — F419 Anxiety disorder, unspecified: Secondary | ICD-10-CM | POA: Diagnosis not present

## 2016-12-30 DIAGNOSIS — Z87891 Personal history of nicotine dependence: Secondary | ICD-10-CM

## 2016-12-30 DIAGNOSIS — R0602 Shortness of breath: Secondary | ICD-10-CM

## 2016-12-30 DIAGNOSIS — Z8041 Family history of malignant neoplasm of ovary: Secondary | ICD-10-CM

## 2016-12-30 DIAGNOSIS — I700 Atherosclerosis of aorta: Secondary | ICD-10-CM

## 2016-12-30 DIAGNOSIS — F1020 Alcohol dependence, uncomplicated: Secondary | ICD-10-CM

## 2016-12-30 DIAGNOSIS — D701 Agranulocytosis secondary to cancer chemotherapy: Secondary | ICD-10-CM | POA: Diagnosis not present

## 2016-12-30 DIAGNOSIS — I4891 Unspecified atrial fibrillation: Secondary | ICD-10-CM

## 2016-12-30 LAB — COMPREHENSIVE METABOLIC PANEL
ALK PHOS: 117 U/L (ref 38–126)
ALT: 16 U/L — ABNORMAL LOW (ref 17–63)
ANION GAP: 8 (ref 5–15)
AST: 33 U/L (ref 15–41)
Albumin: 3.4 g/dL — ABNORMAL LOW (ref 3.5–5.0)
BILIRUBIN TOTAL: 0.8 mg/dL (ref 0.3–1.2)
BUN: 16 mg/dL (ref 6–20)
CO2: 22 mmol/L (ref 22–32)
CREATININE: 1.44 mg/dL — AB (ref 0.61–1.24)
Calcium: 6 mg/dL — CL (ref 8.9–10.3)
Chloride: 106 mmol/L (ref 101–111)
GFR, EST AFRICAN AMERICAN: 50 mL/min — AB (ref >60)
GFR, EST NON AFRICAN AMERICAN: 43 mL/min — AB (ref >60)
Glucose, Bld: 207 mg/dL — ABNORMAL HIGH (ref 65–99)
Potassium: 3.9 mmol/L (ref 3.5–5.1)
Sodium: 136 mmol/L (ref 135–145)
TOTAL PROTEIN: 6.4 g/dL — AB (ref 6.5–8.1)

## 2016-12-30 LAB — CBC WITH DIFFERENTIAL/PLATELET
Basophils Absolute: 0 10*3/uL (ref 0–0.1)
Basophils Relative: 1 %
EOS ABS: 0.1 10*3/uL (ref 0–0.7)
Eosinophils Relative: 3 %
HEMATOCRIT: 36.1 % — AB (ref 40.0–52.0)
HEMOGLOBIN: 12.4 g/dL — AB (ref 13.0–18.0)
LYMPHS ABS: 0.6 10*3/uL — AB (ref 1.0–3.6)
LYMPHS PCT: 25 %
MCH: 32 pg (ref 26.0–34.0)
MCHC: 34.5 g/dL (ref 32.0–36.0)
MCV: 92.7 fL (ref 80.0–100.0)
MONOS PCT: 21 %
Monocytes Absolute: 0.5 10*3/uL (ref 0.2–1.0)
NEUTROS PCT: 50 %
Neutro Abs: 1.2 10*3/uL — ABNORMAL LOW (ref 1.4–6.5)
Platelets: 210 10*3/uL (ref 150–440)
RBC: 3.89 MIL/uL — ABNORMAL LOW (ref 4.40–5.90)
RDW: 16.5 % — ABNORMAL HIGH (ref 11.5–14.5)
WBC: 2.4 10*3/uL — ABNORMAL LOW (ref 3.8–10.6)

## 2016-12-30 MED ORDER — SODIUM CHLORIDE 0.9 % IV SOLN
2.0000 g | Freq: Once | INTRAVENOUS | Status: DC
  Filled 2016-12-30: qty 20

## 2016-12-30 MED ORDER — ACETAMINOPHEN 325 MG PO TABS
650.0000 mg | ORAL_TABLET | Freq: Once | ORAL | Status: AC
  Administered 2016-12-30: 650 mg via ORAL
  Filled 2016-12-30: qty 2

## 2016-12-30 MED ORDER — ACETAMINOPHEN 325 MG PO TABS
ORAL_TABLET | ORAL | Status: AC
  Filled 2016-12-30: qty 1

## 2016-12-30 MED ORDER — SODIUM CHLORIDE 0.9 % IV SOLN: 10.0000 mg | Freq: Once | INTRAVENOUS | Status: DC

## 2016-12-30 MED ORDER — PACLITAXEL CHEMO INJECTION 300 MG/50ML
40.0000 mg/m2 | Freq: Once | INTRAVENOUS | Status: AC
  Administered 2016-12-30: 96 mg via INTRAVENOUS
  Filled 2016-12-30: qty 16

## 2016-12-30 MED ORDER — SODIUM CHLORIDE 0.9 % IV SOLN
Freq: Once | INTRAVENOUS | Status: AC
  Administered 2016-12-30: 11:00:00 via INTRAVENOUS
  Filled 2016-12-30: qty 1000

## 2016-12-30 MED ORDER — DIPHENHYDRAMINE HCL 50 MG/ML IJ SOLN
25.0000 mg | Freq: Once | INTRAMUSCULAR | Status: AC
  Administered 2016-12-30: 25 mg via INTRAVENOUS
  Filled 2016-12-30: qty 1

## 2016-12-30 MED ORDER — SODIUM CHLORIDE 0.9 % IV SOLN
2.0000 g | Freq: Once | INTRAVENOUS | Status: AC
  Administered 2016-12-30: 2 g via INTRAVENOUS
  Filled 2016-12-30: qty 20

## 2016-12-30 MED ORDER — HEPARIN SOD (PORK) LOCK FLUSH 100 UNIT/ML IV SOLN
500.0000 [IU] | Freq: Once | INTRAVENOUS | Status: AC | PRN
  Administered 2016-12-30: 500 [IU]

## 2016-12-30 MED ORDER — FAMOTIDINE IN NACL 20-0.9 MG/50ML-% IV SOLN
20.0000 mg | Freq: Once | INTRAVENOUS | Status: AC
  Administered 2016-12-30: 20 mg via INTRAVENOUS
  Filled 2016-12-30: qty 50

## 2016-12-30 MED ORDER — DEXAMETHASONE SODIUM PHOSPHATE 10 MG/ML IJ SOLN
10.0000 mg | Freq: Once | INTRAMUSCULAR | Status: AC
  Administered 2016-12-30: 10 mg via INTRAVENOUS
  Filled 2016-12-30: qty 1

## 2017-01-05 NOTE — Progress Notes (Signed)
[place]  Telephone:([phone] [phone]  ID: Miguel [name]. 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 He feels improved and nearly back to his baseline. He admits to continued weakness and fatigue. He denies any pain. He denies any dysphagia or difficulty swallowing. He has no neurologic complaints, although admits to worsening memory. He denies any recent fevers or illnesses. He continues to have chronic shortness of breath. He denies any nausea, vomiting, constipation, or diarrhea. He has no urinary complaints. Patient offers no further specific complaints today.  REVIEW OF SYSTEMS:   Review of Systems  Constitutional: Positive for malaise/fatigue. Negative for fever and weight loss.  Respiratory: Positive for shortness of breath. Negative for cough.   Cardiovascular: Negative for chest pain and leg swelling.  Gastrointestinal: Negative.  Negative for abdominal pain, heartburn, nausea and vomiting.  Genitourinary: Negative.   Musculoskeletal: Negative.   Skin: Negative.  Negative for rash.  Neurological: Positive for dizziness and weakness. Negative for sensory change.  Psychiatric/Behavioral: Positive for memory loss. The patient is not nervous/anxious and does not have insomnia.     As per HPI. Otherwise, a complete review of systems is negative.  PAST MEDICAL HISTORY: Past Medical History:  Diagnosis Date  . Alcoholism ([place])   . Anxiety   . COPD (chronic obstructive pulmonary disease) ([place])   . Dementia 03/18/2016  . DM type 2 with diabetic peripheral neuropathy ([place]) 03/18/2016  . Dyspnea    slight   . Dysrhythmia    atrial fib   . Esophageal  cancer ([place])   . Esophageal stricture   . GERD (gastroesophageal reflux disease) 03/18/2016  . Hyperlipidemia   . Irregular heart rhythm   . Kidney carcinoma ([place])    right, sp ablation  . Macular degeneration   . Neuropathy   . Renal mass     PAST SURGICAL HISTORY: Past Surgical History:  Procedure Laterality Date  . BACK SURGERY    . CATARACT EXTRACTION    . CYSTOSCOPY W/ RETROGRADES Right 07/21/2016   Procedure: CYSTOSCOPY WITH RETROGRADE PYELOGRAM;  Surgeon: Hollice Espy, MD;  Location: [facility] ORS;  Service: Urology;  Laterality: Right;  . CYSTOSCOPY WITH STENT PLACEMENT Right 07/21/2016   Procedure: CYSTOSCOPY WITH STENT PLACEMENT;  Surgeon: Hollice Espy, MD;  Location: [facility] ORS;  Service: Urology;  Laterality: Right;  . IR RADIOLOGIST EVAL & MGMT  09/09/2016  . IR RADIOLOGIST EVAL & MGMT  06/19/2016  . KNEE SURGERY    . RADIOLOGY WITH ANESTHESIA N/A 07/23/2016   Procedure: RENAL CRYOABLATION;  Surgeon: Aletta Edouard, MD;  Location: [facility];  Service: Radiology;  Laterality: N/A;    FAMILY HISTORY: Family History  Problem Relation Age of Onset  . Ovarian cancer Sister   . Throat cancer Brother   . Diabetes Mother   . Kidney disease Father   . Prostate cancer Neg Hx   . Kidney cancer Neg Hx     ADVANCED DIRECTIVES (Y/N):  N  HEALTH MAINTENANCE: Social History  Substance Use Topics  . Smoking status: Former Smoker    Types: Cigarettes    Quit date: 37  . Smokeless tobacco: Never Used  . Alcohol use No     Comment: quit 2012  Colonoscopy:  PAP:  Bone density:  Lipid panel:  No Known Allergies  Current Outpatient Prescriptions  Medication Sig Dispense Refill  . ADVAIR DISKUS 250-50 MCG/DOSE AEPB Inhale 1 puff into the lungs 2 (two) times daily. 180 each 3  . ALPRAZolam (XANAX) 0.5 MG tablet Take 1 tablet (0.5 mg total) by mouth 3 (three) times daily as needed for anxiety. 30 tablet 0  . aspirin 81 MG chewable tablet Chew 1 tablet (81 mg total) by mouth  daily. 30 tablet 5  . atorvastatin (LIPITOR) 40 MG tablet Take 1 tablet (40 mg total) by mouth daily. 90 tablet 3  . donepezil (ARICEPT) 10 MG tablet TAKE 1 TABLET AT BEDTIME 90 tablet 1  . fesoterodine (TOVIAZ) 8 MG TB24 tablet Take 1 tablet (8 mg total) by mouth daily. 90 tablet 2  . furosemide (LASIX) 20 MG tablet Take 1 tablet (20 mg total) by mouth daily. 90 tablet 3  . glucose blood test strip Use as instructed 100 each 12  . ketoconazole (NIZORAL) 2 % cream Apply 1 application topically daily. 60 g 1  . lidocaine-prilocaine (EMLA) cream Apply to affected area once 30 g 3  . megestrol (MEGACE) 40 MG tablet Take 1 tablet (40 mg total) by mouth daily. 30 tablet 2  . metFORMIN (GLUCOPHAGE) 1000 MG tablet Take 1,000 mg by mouth 2 (two) times daily with a meal.    . metoprolol tartrate (LOPRESSOR) 25 MG tablet Take 0.5 tablets (12.5 mg total) by mouth 2 (two) times daily. 60 tablet 6  . Multiple Vitamins-Minerals (CENTRUM SILVER PO) Take 1 tablet by mouth daily.    Marland Kitchen omeprazole (PRILOSEC) 20 MG capsule Take 1 capsule (20 mg total) by mouth 2 (two) times daily before a meal. 180 capsule 3  . ondansetron (ZOFRAN) 8 MG tablet Take 1 tablet (8 mg total) by mouth 2 (two) times daily as needed (Nausea or vomiting). 30 tablet 3  . polyethylene glycol (MIRALAX / GLYCOLAX) packet Take 17 g by mouth daily. 14 each 0  . prochlorperazine (COMPAZINE) 10 MG tablet Take 1 tablet (10 mg total) by mouth every 6 (six) hours as needed (Nausea or vomiting). 30 tablet 3  . QUEtiapine (SEROQUEL) 50 MG tablet Take 1 tablet (50 mg total) by mouth at bedtime. 90 tablet 1   No current facility-administered medications for this visit.     OBJECTIVE: Vitals:   01/06/17 1408  BP: 127/86  Pulse: 97  Resp: 18  Temp: 97.7 F (36.5 C)     Body mass index is 34.37 kg/m.    ECOG FS:0 - Asymptomatic  General: Well-developed, well-nourished, no acute distress. Eyes: Pink conjunctiva, anicteric sclera. HEENT:  Normocephalic, moist mucous membranes, clear oropharnyx. Lungs: Clear to auscultation bilaterally. Heart: Regular rate and rhythm. No rubs, murmurs, or gallops. Abdomen: Soft, nontender, nondistended. No organomegaly noted, normoactive bowel sounds. Musculoskeletal: No edema, cyanosis, or clubbing. Neuro: Alert, answering all questions appropriately. Cranial nerves grossly intact. Skin: No rashes or petechiae noted. Psych: Normal affect.  LAB RESULTS:  Lab Results  Component Value Date   NA 135 01/06/2017   K 4.4 01/06/2017   CL 105 01/06/2017   CO2 23 01/06/2017   GLUCOSE 124 (H) 01/06/2017   BUN 19 01/06/2017   CREATININE 1.42 (H) 01/06/2017   CALCIUM 8.2 (L) 01/06/2017   PROT 6.5 01/06/2017   ALBUMIN 3.4 (L) 01/06/2017   AST 29 01/06/2017   ALT 17 01/06/2017   ALKPHOS 109 01/06/2017   BILITOT 0.9 01/06/2017  GFRNONAA 43 (L) 01/06/2017   GFRAA 50 (L) 01/06/2017    Lab Results  Component Value Date   WBC 3.6 (L) 01/06/2017   NEUTROABS 2.7 01/06/2017   HGB 11.8 (L) 01/06/2017   HCT 34.5 (L) 01/06/2017   MCV 92.8 01/06/2017   PLT 173 01/06/2017     STUDIES: No results found.  ONCOLOGY HISTORY: Initial diagnosis was in approximately October 2016. This was followed by concurrent chemotherapy with infusional 5-FU and XRT.  Patient received low-dose weekly carboplatinum and Taxol in approximately April and May 2017. He subsequently had a second recurrence and was initiated on Taxol 40 mg/m on days 1, 8, and 15 (Taxol was dose reduced 50% secondary to neuropathy) along with Ramucirumab 8 mg/m on days 1 and 15 starting September 27, 2015 through January 24, 2016. This was a 28 day cycle.   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 Recent biopsy of the lesion in his liver consistent with his original diagnosis. PET scan results  from November 24, 2016 reviewed independently with widespread metastatic disease in his liver, lymph nodes, and bones. Given patient's pancytopenia, he likely can continue with single agent Taxol only. Return to clinic in 1 week for consideration of cycle 2, day 1 of Taxol 40 mg/m. Ramucirumab has been discontinued. Patient will also receive Xgeva for his bony disease on day 1 of every cycle. Will reimage after cycle 3. 2. Neuropathy: Continue gabapentin and hydrocodone as prescribed. 3. Esophageal stricture: Patient has a history of multiple esophageal dilations. He reports he has a GI appointment in the next several weeks.  4. Right renal cell carcinoma: Patient had cryoablation performed on July 23, 2016. Repeat MRI November 03, 2016. Continue follow-up with interventional radiology as indicated. 5. Renal insufficiency: Patient's creatinine is approximately his baseline, monitor. 6. Bony metastatic disease: Xgeva as above. 7. Chest pain: Patient does not complain of this today. Possibly related to his known esophageal stricture or bony disease. Patient also reports she has follow-up with cardiology in the next several weeks. 8. Poor appetite: Continue Megace. 9. Anxiety/insomnia: Continue Xanax. 10. Hypocalcemia: Significantly improved. Continue oral potassium supplementation. 11. Neutropenia: Proceed with single agent Taxol as above.   Patient expressed understanding and was in agreement with this plan. He also understands that He can call clinic at any time with any questions, concerns, or complaints.   Cancer Staging Squamous cell esophageal cancer [facility]) Staging form: Esophagus - Squamous Cell Carcinoma, AJCC 7th Edition - Clinical stage from 02/27/2016: Stage IV (T3, N2, M1) - Signed by Lloyd Huger, MD on 12/02/2016   Lloyd Huger, MD   01/09/2017 2:55 PM

## 2017-01-06 ENCOUNTER — Inpatient Hospital Stay: Payer: Medicare Other

## 2017-01-06 ENCOUNTER — Inpatient Hospital Stay (HOSPITAL_BASED_OUTPATIENT_CLINIC_OR_DEPARTMENT_OTHER): Payer: Medicare Other | Admitting: Oncology

## 2017-01-06 ENCOUNTER — Ambulatory Visit: Payer: Medicare Other | Admitting: Internal Medicine

## 2017-01-06 VITALS — BP 127/86 | HR 97 | Temp 97.7°F | Resp 18 | Wt 246.4 lb

## 2017-01-06 DIAGNOSIS — C159 Malignant neoplasm of esophagus, unspecified: Secondary | ICD-10-CM

## 2017-01-06 DIAGNOSIS — E785 Hyperlipidemia, unspecified: Secondary | ICD-10-CM

## 2017-01-06 DIAGNOSIS — C787 Secondary malignant neoplasm of liver and intrahepatic bile duct: Secondary | ICD-10-CM | POA: Diagnosis not present

## 2017-01-06 DIAGNOSIS — R0602 Shortness of breath: Secondary | ICD-10-CM

## 2017-01-06 DIAGNOSIS — Z7984 Long term (current) use of oral hypoglycemic drugs: Secondary | ICD-10-CM

## 2017-01-06 DIAGNOSIS — R531 Weakness: Secondary | ICD-10-CM

## 2017-01-06 DIAGNOSIS — I700 Atherosclerosis of aorta: Secondary | ICD-10-CM

## 2017-01-06 DIAGNOSIS — R5383 Other fatigue: Secondary | ICD-10-CM | POA: Diagnosis not present

## 2017-01-06 DIAGNOSIS — K219 Gastro-esophageal reflux disease without esophagitis: Secondary | ICD-10-CM

## 2017-01-06 DIAGNOSIS — R5381 Other malaise: Secondary | ICD-10-CM | POA: Diagnosis not present

## 2017-01-06 DIAGNOSIS — Z5111 Encounter for antineoplastic chemotherapy: Secondary | ICD-10-CM | POA: Diagnosis not present

## 2017-01-06 DIAGNOSIS — C641 Malignant neoplasm of right kidney, except renal pelvis: Secondary | ICD-10-CM | POA: Diagnosis not present

## 2017-01-06 DIAGNOSIS — H3530 Unspecified macular degeneration: Secondary | ICD-10-CM

## 2017-01-06 DIAGNOSIS — R42 Dizziness and giddiness: Secondary | ICD-10-CM

## 2017-01-06 DIAGNOSIS — F0390 Unspecified dementia without behavioral disturbance: Secondary | ICD-10-CM

## 2017-01-06 DIAGNOSIS — C7951 Secondary malignant neoplasm of bone: Secondary | ICD-10-CM

## 2017-01-06 DIAGNOSIS — Z95828 Presence of other vascular implants and grafts: Secondary | ICD-10-CM

## 2017-01-06 DIAGNOSIS — Z800 Family history of malignant neoplasm of digestive organs: Secondary | ICD-10-CM

## 2017-01-06 DIAGNOSIS — Z7982 Long term (current) use of aspirin: Secondary | ICD-10-CM

## 2017-01-06 DIAGNOSIS — T451X5S Adverse effect of antineoplastic and immunosuppressive drugs, sequela: Secondary | ICD-10-CM

## 2017-01-06 DIAGNOSIS — I2510 Atherosclerotic heart disease of native coronary artery without angina pectoris: Secondary | ICD-10-CM

## 2017-01-06 DIAGNOSIS — Z8041 Family history of malignant neoplasm of ovary: Secondary | ICD-10-CM

## 2017-01-06 DIAGNOSIS — J449 Chronic obstructive pulmonary disease, unspecified: Secondary | ICD-10-CM

## 2017-01-06 DIAGNOSIS — Z87891 Personal history of nicotine dependence: Secondary | ICD-10-CM

## 2017-01-06 DIAGNOSIS — D701 Agranulocytosis secondary to cancer chemotherapy: Secondary | ICD-10-CM | POA: Diagnosis not present

## 2017-01-06 DIAGNOSIS — R630 Anorexia: Secondary | ICD-10-CM

## 2017-01-06 DIAGNOSIS — I4891 Unspecified atrial fibrillation: Secondary | ICD-10-CM

## 2017-01-06 DIAGNOSIS — F1020 Alcohol dependence, uncomplicated: Secondary | ICD-10-CM

## 2017-01-06 DIAGNOSIS — Z79899 Other long term (current) drug therapy: Secondary | ICD-10-CM

## 2017-01-06 DIAGNOSIS — E119 Type 2 diabetes mellitus without complications: Secondary | ICD-10-CM

## 2017-01-06 LAB — CBC WITH DIFFERENTIAL/PLATELET
BASOS ABS: 0.1 10*3/uL (ref 0–0.1)
BASOS PCT: 3 %
EOS ABS: 0.1 10*3/uL (ref 0–0.7)
Eosinophils Relative: 2 %
HEMATOCRIT: 34.5 % — AB (ref 40.0–52.0)
Hemoglobin: 11.8 g/dL — ABNORMAL LOW (ref 13.0–18.0)
Lymphocytes Relative: 17 %
Lymphs Abs: 0.6 10*3/uL — ABNORMAL LOW (ref 1.0–3.6)
MCH: 31.8 pg (ref 26.0–34.0)
MCHC: 34.2 g/dL (ref 32.0–36.0)
MCV: 92.8 fL (ref 80.0–100.0)
MONO ABS: 0.2 10*3/uL (ref 0.2–1.0)
MONOS PCT: 5 %
NEUTROS ABS: 2.7 10*3/uL (ref 1.4–6.5)
NEUTROS PCT: 73 %
Platelets: 173 10*3/uL (ref 150–440)
RBC: 3.72 MIL/uL — ABNORMAL LOW (ref 4.40–5.90)
RDW: 16.1 % — AB (ref 11.5–14.5)
WBC: 3.6 10*3/uL — ABNORMAL LOW (ref 3.8–10.6)

## 2017-01-06 LAB — COMPREHENSIVE METABOLIC PANEL
ALBUMIN: 3.4 g/dL — AB (ref 3.5–5.0)
ALT: 17 U/L (ref 17–63)
ANION GAP: 7 (ref 5–15)
AST: 29 U/L (ref 15–41)
Alkaline Phosphatase: 109 U/L (ref 38–126)
BUN: 19 mg/dL (ref 6–20)
CHLORIDE: 105 mmol/L (ref 101–111)
CO2: 23 mmol/L (ref 22–32)
Calcium: 8.2 mg/dL — ABNORMAL LOW (ref 8.9–10.3)
Creatinine, Ser: 1.42 mg/dL — ABNORMAL HIGH (ref 0.61–1.24)
GFR calc Af Amer: 50 mL/min — ABNORMAL LOW (ref >60)
GFR calc non Af Amer: 43 mL/min — ABNORMAL LOW (ref >60)
GLUCOSE: 124 mg/dL — AB (ref 65–99)
POTASSIUM: 4.4 mmol/L (ref 3.5–5.1)
SODIUM: 135 mmol/L (ref 135–145)
Total Bilirubin: 0.9 mg/dL (ref 0.3–1.2)
Total Protein: 6.5 g/dL (ref 6.5–8.1)

## 2017-01-06 MED ORDER — SODIUM CHLORIDE 0.9% FLUSH
10.0000 mL | INTRAVENOUS | Status: AC | PRN
  Administered 2017-01-06: 10 mL
  Filled 2017-01-06: qty 10

## 2017-01-06 MED ORDER — HEPARIN SOD (PORK) LOCK FLUSH 100 UNIT/ML IV SOLN
500.0000 [IU] | INTRAVENOUS | Status: AC | PRN
  Administered 2017-01-06: 500 [IU]

## 2017-01-06 NOTE — Progress Notes (Signed)
Patient reports continued weakness today. Patients wife reports he had episode of dizziness yesterday, blood pressure was low at that time.

## 2017-01-11 NOTE — Progress Notes (Signed)
[place]  [phone] [phone]  ID: Miguel [name]. 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 He currently feels well and is approximately at his baseline. He admits to continued weakness and fatigue. He denies any pain. He has occasional dysphagia, but denies any difficulty swallowing. He has no neurologic complaints, although admits to worsening memory. He denies any recent fevers or illnesses. He continues to have chronic shortness of breath. He denies any nausea, vomiting, constipation, or diarrhea. He has no urinary complaints. Patient offers no further specific complaints today.  REVIEW OF SYSTEMS:   Review of Systems  Constitutional: Positive for malaise/fatigue. Negative for fever and weight loss.  HENT: Positive for sore throat.   Respiratory: Positive for shortness of breath. Negative for cough.   Cardiovascular: Negative for chest pain and leg swelling.  Gastrointestinal: Negative.  Negative for abdominal pain, heartburn, nausea and vomiting.  Genitourinary: Negative.   Musculoskeletal: Negative.   Skin: Negative.  Negative for rash.  Neurological: Positive for weakness. Negative for dizziness and sensory change.  Psychiatric/Behavioral: Positive for memory loss. The patient is not nervous/anxious and does not have insomnia.     As per HPI. Otherwise, a complete review of systems is negative.  PAST MEDICAL HISTORY: Past Medical History:  Diagnosis Date  . Alcoholism ([place])   . Anxiety   . COPD (chronic obstructive pulmonary disease) ([place])   . Dementia 03/18/2016  . DM type 2 with diabetic peripheral neuropathy ([place])  03/18/2016  . Dyspnea    slight   . Dysrhythmia    atrial fib   . Esophageal cancer ([place])   . Esophageal stricture   . GERD (gastroesophageal reflux disease) 03/18/2016  . Hyperlipidemia   . Irregular heart rhythm   . Kidney carcinoma ([place])    right, sp ablation  . Macular degeneration   . Neuropathy   . Renal mass     PAST SURGICAL HISTORY: Past Surgical History:  Procedure Laterality Date  . BACK SURGERY    . CATARACT EXTRACTION    . CYSTOSCOPY W/ RETROGRADES Right 07/21/2016   Procedure: CYSTOSCOPY WITH RETROGRADE PYELOGRAM;  Surgeon: Hollice Espy, MD;  Location: [facility] ORS;  Service: Urology;  Laterality: Right;  . CYSTOSCOPY WITH STENT PLACEMENT Right 07/21/2016   Procedure: CYSTOSCOPY WITH STENT PLACEMENT;  Surgeon: Hollice Espy, MD;  Location: [facility] ORS;  Service: Urology;  Laterality: Right;  . IR RADIOLOGIST EVAL & MGMT  09/09/2016  . IR RADIOLOGIST EVAL & MGMT  06/19/2016  . KNEE SURGERY    . RADIOLOGY WITH ANESTHESIA N/A 07/23/2016   Procedure: RENAL CRYOABLATION;  Surgeon: Aletta Edouard, MD;  Location: [facility];  Service: Radiology;  Laterality: N/A;    FAMILY HISTORY: Family History  Problem Relation Age of Onset  . Ovarian cancer Sister   . Throat cancer Brother   . Diabetes Mother   . Kidney disease Father   . Prostate cancer Neg Hx   . Kidney cancer Neg Hx     ADVANCED DIRECTIVES (Y/N):  N  HEALTH MAINTENANCE: Social History  Substance Use Topics  . Smoking status: Former Smoker    Types: Cigarettes    Quit date: 47  . Smokeless tobacco:  Never Used  . Alcohol use No     Comment: quit 2012      Colonoscopy:  PAP:  Bone density:  Lipid panel:  No Known Allergies  Current Outpatient Prescriptions  Medication Sig Dispense Refill  . ADVAIR DISKUS 250-50 MCG/DOSE AEPB Inhale 1 puff into the lungs 2 (two) times daily. 180 each 3  . ALPRAZolam (XANAX) 0.5 MG tablet Take 1 tablet (0.5 mg total) by mouth 3 (three) times daily as needed for anxiety. 30  tablet 0  . aspirin 81 MG chewable tablet Chew 1 tablet (81 mg total) by mouth daily. 30 tablet 5  . atorvastatin (LIPITOR) 40 MG tablet Take 1 tablet (40 mg total) by mouth daily. 90 tablet 3  . donepezil (ARICEPT) 10 MG tablet TAKE 1 TABLET AT BEDTIME 90 tablet 1  . fesoterodine (TOVIAZ) 8 MG TB24 tablet Take 1 tablet (8 mg total) by mouth daily. 90 tablet 2  . furosemide (LASIX) 20 MG tablet Take 1 tablet (20 mg total) by mouth daily. 90 tablet 3  . glucose blood test strip Use as instructed 100 each 12  . ketoconazole (NIZORAL) 2 % cream Apply 1 application topically daily. 60 g 1  . lidocaine-prilocaine (EMLA) cream Apply to affected area once 30 g 3  . megestrol (MEGACE) 40 MG tablet Take 1 tablet (40 mg total) by mouth daily. 30 tablet 2  . metFORMIN (GLUCOPHAGE) 1000 MG tablet Take 1,000 mg by mouth 2 (two) times daily with a meal.    . metoprolol tartrate (LOPRESSOR) 25 MG tablet Take 0.5 tablets (12.5 mg total) by mouth 2 (two) times daily. 60 tablet 6  . Multiple Vitamins-Minerals (CENTRUM SILVER PO) Take 1 tablet by mouth daily.    Marland Kitchen omeprazole (PRILOSEC) 20 MG capsule Take 1 capsule (20 mg total) by mouth 2 (two) times daily before a meal. 180 capsule 3  . ondansetron (ZOFRAN) 8 MG tablet Take 1 tablet (8 mg total) by mouth 2 (two) times daily as needed (Nausea or vomiting). 30 tablet 3  . polyethylene glycol (MIRALAX / GLYCOLAX) packet Take 17 g by mouth daily. 14 each 0  . prochlorperazine (COMPAZINE) 10 MG tablet Take 1 tablet (10 mg total) by mouth every 6 (six) hours as needed (Nausea or vomiting). 30 tablet 3  . QUEtiapine (SEROQUEL) 50 MG tablet Take 1 tablet (50 mg total) by mouth at bedtime. 90 tablet 1  . tamsulosin (FLOMAX) 0.4 MG CAPS capsule Take 0.4 mg by mouth daily.     No current facility-administered medications for this visit.    Facility-Administered Medications Ordered in Other Visits  Medication Dose Route Frequency Provider Last Rate Last Dose  . 0.9 %   sodium chloride infusion   Intravenous Once Lloyd Huger, MD      . acetaminophen (TYLENOL) tablet 650 mg  650 mg Oral Once Lloyd Huger, MD      . dexamethasone (DECADRON) injection 10 mg  10 mg Intravenous Once Lloyd Huger, MD      . diphenhydrAMINE (BENADRYL) injection 25 mg  25 mg Intravenous Once Lloyd Huger, MD      . famotidine (PEPCID) IVPB 20 mg premix  20 mg Intravenous Once Lloyd Huger, MD      . PACLitaxel (TAXOL) 96 mg in dextrose 5 % 250 mL chemo infusion (</= 86m/m2)  40 mg/m2 (Treatment Plan Recorded) Intravenous Once FLloyd Huger MD        OBJECTIVE: Vitals:   01/13/17  1110  BP: 111/73  Pulse: 89  Temp: (!) 97.3 F (36.3 C)     Body mass index is 34.53 kg/m.    ECOG FS:0 - Asymptomatic  General: Well-developed, well-nourished, no acute distress. Eyes: Pink conjunctiva, anicteric sclera. HEENT: Normocephalic, moist mucous membranes, clear oropharnyx. Lungs: Clear to auscultation bilaterally. Heart: Regular rate and rhythm. No rubs, murmurs, or gallops. Abdomen: Soft, nontender, nondistended. No organomegaly noted, normoactive bowel sounds. Musculoskeletal: No edema, cyanosis, or clubbing. Neuro: Alert, answering all questions appropriately. Cranial nerves grossly intact. Skin: No rashes or petechiae noted. Psych: Normal affect.  LAB RESULTS:  Lab Results  Component Value Date   NA 134 (L) 01/13/2017   K 4.0 01/13/2017   CL 104 01/13/2017   CO2 23 01/13/2017   GLUCOSE 187 (H) 01/13/2017   BUN 17 01/13/2017   CREATININE 1.33 (H) 01/13/2017   CALCIUM 8.0 (L) 01/13/2017   PROT 6.4 (L) 01/13/2017   ALBUMIN 3.2 (L) 01/13/2017   AST 27 01/13/2017   ALT 15 (L) 01/13/2017   ALKPHOS 113 01/13/2017   BILITOT 0.7 01/13/2017   GFRNONAA 47 (L) 01/13/2017   GFRAA 55 (L) 01/13/2017    Lab Results  Component Value Date   WBC 3.1 (L) 01/13/2017   NEUTROABS 1.7 01/13/2017   HGB 11.8 (L) 01/13/2017   HCT 34.2 (L)  01/13/2017   MCV 93.0 01/13/2017   PLT 171 01/13/2017     STUDIES: No results found.  ONCOLOGY HISTORY: Initial diagnosis was in approximately October 2016. This was followed by concurrent chemotherapy with infusional 5-FU and XRT.  Patient received low-dose weekly carboplatinum and Taxol in approximately April and May 2017. He subsequently had a second recurrence and was initiated on Taxol 40 mg/m on days 1, 8, and 15 (Taxol was dose reduced 50% secondary to neuropathy) along with Ramucirumab 8 mg/m on days 1 and 15 starting September 27, 2015 through January 24, 2016. This was a 28 day cycle.   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 Recent biopsy of the lesion in his liver consistent with his original diagnosis. PET scan results from November 24, 2016 reviewed independently with widespread metastatic disease in his liver, lymph nodes, and bones. Given patient's pancytopenia, he likely can continue with single agent Taxol only. Proceed with cycle 2, day 1 of Taxol 40 mg/m only today. Ramucirumab has been discontinued. Will discontinue Xgeva given his recent hypocalcemia. Return to clinic in 1 week for further evaluation and consideration of cycle 2, day 8.  2. Neuropathy: Continue gabapentin and hydrocodone as prescribed. 3. Esophageal stricture: Patient has a history of multiple esophageal dilations. He reports he has a GI appointment near the end of October. 4. Right renal cell carcinoma: Patient had cryoablation performed on July 23, 2016. Repeat MRI November 03, 2016. Continue follow-up with interventional radiology as indicated. 5. Renal insufficiency: Patient's creatinine is approximately his baseline, monitor. 6. Bony metastatic disease: Discontinue Xgeva as above secondary to history of hypocalcemia. 7. Chest pain: Patient does not complain of  this today. Possibly related to his known esophageal stricture or bony disease. Continue follow-up with cardiology as indicated.  8. Poor appetite: Continue Megace. 9. Anxiety/insomnia: Continue Xanax. 10. Hypocalcemia: Significantly improved. Continue oral calcium supplementation. No Xgeva as above. 11. Neutropenia: Proceed with single agent Taxol as above.   Patient expressed understanding and was in  agreement with this plan. He also understands that He can call clinic at any time with any questions, concerns, or complaints.   Cancer Staging Squamous cell esophageal cancer [facility]) Staging form: Esophagus - Squamous Cell Carcinoma, AJCC 7th Edition - Clinical stage from 02/27/2016: Stage IV (T3, N2, M1) - Signed by Lloyd Huger, MD on 12/02/2016   Lloyd Huger, MD   01/13/2017 11:45 AM

## 2017-01-13 ENCOUNTER — Inpatient Hospital Stay: Payer: Medicare Other

## 2017-01-13 ENCOUNTER — Inpatient Hospital Stay: Payer: Medicare Other | Attending: Oncology | Admitting: Oncology

## 2017-01-13 ENCOUNTER — Encounter: Payer: Self-pay | Admitting: Oncology

## 2017-01-13 VITALS — BP 111/73 | HR 89 | Temp 97.3°F | Wt 247.6 lb

## 2017-01-13 VITALS — BP 119/83 | HR 88 | Temp 97.3°F | Resp 18

## 2017-01-13 DIAGNOSIS — G629 Polyneuropathy, unspecified: Secondary | ICD-10-CM | POA: Diagnosis not present

## 2017-01-13 DIAGNOSIS — E785 Hyperlipidemia, unspecified: Secondary | ICD-10-CM | POA: Insufficient documentation

## 2017-01-13 DIAGNOSIS — C641 Malignant neoplasm of right kidney, except renal pelvis: Secondary | ICD-10-CM

## 2017-01-13 DIAGNOSIS — I4891 Unspecified atrial fibrillation: Secondary | ICD-10-CM | POA: Diagnosis not present

## 2017-01-13 DIAGNOSIS — C787 Secondary malignant neoplasm of liver and intrahepatic bile duct: Secondary | ICD-10-CM | POA: Diagnosis not present

## 2017-01-13 DIAGNOSIS — G4709 Other insomnia: Secondary | ICD-10-CM | POA: Insufficient documentation

## 2017-01-13 DIAGNOSIS — R1310 Dysphagia, unspecified: Secondary | ICD-10-CM | POA: Diagnosis not present

## 2017-01-13 DIAGNOSIS — R5382 Chronic fatigue, unspecified: Secondary | ICD-10-CM | POA: Diagnosis not present

## 2017-01-13 DIAGNOSIS — K219 Gastro-esophageal reflux disease without esophagitis: Secondary | ICD-10-CM | POA: Insufficient documentation

## 2017-01-13 DIAGNOSIS — N289 Disorder of kidney and ureter, unspecified: Secondary | ICD-10-CM | POA: Insufficient documentation

## 2017-01-13 DIAGNOSIS — D709 Neutropenia, unspecified: Secondary | ICD-10-CM | POA: Insufficient documentation

## 2017-01-13 DIAGNOSIS — Z87891 Personal history of nicotine dependence: Secondary | ICD-10-CM | POA: Insufficient documentation

## 2017-01-13 DIAGNOSIS — R531 Weakness: Secondary | ICD-10-CM | POA: Diagnosis not present

## 2017-01-13 DIAGNOSIS — K222 Esophageal obstruction: Secondary | ICD-10-CM | POA: Insufficient documentation

## 2017-01-13 DIAGNOSIS — F419 Anxiety disorder, unspecified: Secondary | ICD-10-CM | POA: Insufficient documentation

## 2017-01-13 DIAGNOSIS — F0390 Unspecified dementia without behavioral disturbance: Secondary | ICD-10-CM | POA: Diagnosis not present

## 2017-01-13 DIAGNOSIS — Z7982 Long term (current) use of aspirin: Secondary | ICD-10-CM | POA: Insufficient documentation

## 2017-01-13 DIAGNOSIS — R5381 Other malaise: Secondary | ICD-10-CM | POA: Diagnosis not present

## 2017-01-13 DIAGNOSIS — D61818 Other pancytopenia: Secondary | ICD-10-CM

## 2017-01-13 DIAGNOSIS — H3530 Unspecified macular degeneration: Secondary | ICD-10-CM | POA: Diagnosis not present

## 2017-01-13 DIAGNOSIS — J449 Chronic obstructive pulmonary disease, unspecified: Secondary | ICD-10-CM

## 2017-01-13 DIAGNOSIS — Z7984 Long term (current) use of oral hypoglycemic drugs: Secondary | ICD-10-CM | POA: Diagnosis not present

## 2017-01-13 DIAGNOSIS — R5383 Other fatigue: Secondary | ICD-10-CM

## 2017-01-13 DIAGNOSIS — C7951 Secondary malignant neoplasm of bone: Secondary | ICD-10-CM | POA: Diagnosis not present

## 2017-01-13 DIAGNOSIS — C779 Secondary and unspecified malignant neoplasm of lymph node, unspecified: Secondary | ICD-10-CM | POA: Insufficient documentation

## 2017-01-13 DIAGNOSIS — C159 Malignant neoplasm of esophagus, unspecified: Secondary | ICD-10-CM | POA: Insufficient documentation

## 2017-01-13 DIAGNOSIS — Z79899 Other long term (current) drug therapy: Secondary | ICD-10-CM

## 2017-01-13 DIAGNOSIS — Z5111 Encounter for antineoplastic chemotherapy: Secondary | ICD-10-CM | POA: Insufficient documentation

## 2017-01-13 LAB — CBC WITH DIFFERENTIAL/PLATELET
BASOS ABS: 0.1 10*3/uL (ref 0–0.1)
Basophils Relative: 3 %
EOS PCT: 7 %
Eosinophils Absolute: 0.2 10*3/uL (ref 0–0.7)
HEMATOCRIT: 34.2 % — AB (ref 40.0–52.0)
Hemoglobin: 11.8 g/dL — ABNORMAL LOW (ref 13.0–18.0)
LYMPHS ABS: 0.6 10*3/uL — AB (ref 1.0–3.6)
LYMPHS PCT: 20 %
MCH: 32.1 pg (ref 26.0–34.0)
MCHC: 34.5 g/dL (ref 32.0–36.0)
MCV: 93 fL (ref 80.0–100.0)
MONO ABS: 0.4 10*3/uL (ref 0.2–1.0)
MONOS PCT: 14 %
Neutro Abs: 1.7 10*3/uL (ref 1.4–6.5)
Neutrophils Relative %: 56 %
PLATELETS: 171 10*3/uL (ref 150–440)
RBC: 3.68 MIL/uL — ABNORMAL LOW (ref 4.40–5.90)
RDW: 16.5 % — AB (ref 11.5–14.5)
WBC: 3.1 10*3/uL — ABNORMAL LOW (ref 3.8–10.6)

## 2017-01-13 LAB — COMPREHENSIVE METABOLIC PANEL
ALT: 15 U/L — ABNORMAL LOW (ref 17–63)
ANION GAP: 7 (ref 5–15)
AST: 27 U/L (ref 15–41)
Albumin: 3.2 g/dL — ABNORMAL LOW (ref 3.5–5.0)
Alkaline Phosphatase: 113 U/L (ref 38–126)
BILIRUBIN TOTAL: 0.7 mg/dL (ref 0.3–1.2)
BUN: 17 mg/dL (ref 6–20)
CHLORIDE: 104 mmol/L (ref 101–111)
CO2: 23 mmol/L (ref 22–32)
Calcium: 8 mg/dL — ABNORMAL LOW (ref 8.9–10.3)
Creatinine, Ser: 1.33 mg/dL — ABNORMAL HIGH (ref 0.61–1.24)
GFR calc Af Amer: 55 mL/min — ABNORMAL LOW (ref >60)
GFR, EST NON AFRICAN AMERICAN: 47 mL/min — AB (ref >60)
GLUCOSE: 187 mg/dL — AB (ref 65–99)
POTASSIUM: 4 mmol/L (ref 3.5–5.1)
Sodium: 134 mmol/L — ABNORMAL LOW (ref 135–145)
Total Protein: 6.4 g/dL — ABNORMAL LOW (ref 6.5–8.1)

## 2017-01-13 MED ORDER — ACETAMINOPHEN 325 MG PO TABS
650.0000 mg | ORAL_TABLET | Freq: Once | ORAL | Status: AC
  Administered 2017-01-13: 650 mg via ORAL
  Filled 2017-01-13: qty 2

## 2017-01-13 MED ORDER — PACLITAXEL CHEMO INJECTION 300 MG/50ML
40.0000 mg/m2 | Freq: Once | INTRAVENOUS | Status: AC
  Administered 2017-01-13: 96 mg via INTRAVENOUS
  Filled 2017-01-13: qty 16

## 2017-01-13 MED ORDER — HEPARIN SOD (PORK) LOCK FLUSH 100 UNIT/ML IV SOLN
500.0000 [IU] | Freq: Once | INTRAVENOUS | Status: AC
  Administered 2017-01-13: 500 [IU] via INTRAVENOUS

## 2017-01-13 MED ORDER — SODIUM CHLORIDE 0.9 % IV SOLN
Freq: Once | INTRAVENOUS | Status: AC
  Administered 2017-01-13: 12:00:00 via INTRAVENOUS
  Filled 2017-01-13: qty 1000

## 2017-01-13 MED ORDER — DEXAMETHASONE SODIUM PHOSPHATE 10 MG/ML IJ SOLN
10.0000 mg | Freq: Once | INTRAMUSCULAR | Status: AC
  Administered 2017-01-13: 10 mg via INTRAVENOUS
  Filled 2017-01-13: qty 1

## 2017-01-13 MED ORDER — DIPHENHYDRAMINE HCL 50 MG/ML IJ SOLN
25.0000 mg | Freq: Once | INTRAMUSCULAR | Status: AC
  Administered 2017-01-13: 25 mg via INTRAVENOUS
  Filled 2017-01-13: qty 1

## 2017-01-13 MED ORDER — HEPARIN SOD (PORK) LOCK FLUSH 100 UNIT/ML IV SOLN
INTRAVENOUS | Status: AC
  Filled 2017-01-13: qty 5

## 2017-01-13 MED ORDER — FAMOTIDINE IN NACL 20-0.9 MG/50ML-% IV SOLN
20.0000 mg | Freq: Once | INTRAVENOUS | Status: AC
  Administered 2017-01-13: 20 mg via INTRAVENOUS
  Filled 2017-01-13: qty 50

## 2017-01-16 ENCOUNTER — Other Ambulatory Visit: Payer: Self-pay | Admitting: Oncology

## 2017-01-18 NOTE — Progress Notes (Signed)
[place]  [phone] [phone]  ID: Miguel [name]. 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 Patient overall feels well, but admits he has some good days and bad days. He has chronic weakness and fatigue that is unchanged. He denies any pain. He has occasional dysphagia, but denies any difficulty swallowing. He has no neurologic complaints, although admits to worsening memory. He denies any recent fevers or illnesses. He continues to have chronic shortness of breath. He denies any nausea, vomiting, constipation, or diarrhea. He has no urinary complaints. Patient offers no further specific complaints today.  REVIEW OF SYSTEMS:   Review of Systems  Constitutional: Positive for malaise/fatigue. Negative for fever and weight loss.  HENT: Positive for sore throat.   Respiratory: Positive for shortness of breath. Negative for cough.   Cardiovascular: Negative for chest pain and leg swelling.  Gastrointestinal: Negative.  Negative for abdominal pain, heartburn, nausea and vomiting.  Genitourinary: Negative.   Musculoskeletal: Negative.   Skin: Negative.  Negative for rash.  Neurological: Positive for weakness. Negative for dizziness and sensory change.  Psychiatric/Behavioral: Positive for memory loss. The patient is not nervous/anxious and does not have insomnia.     As per HPI. Otherwise, a complete review of systems is negative.  PAST MEDICAL HISTORY: Past Medical History:  Diagnosis Date  . Alcoholism ([place])   . Anxiety   . COPD (chronic obstructive pulmonary disease) ([place])   . Dementia 03/18/2016  . DM type 2 with diabetic peripheral  neuropathy ([place]) 03/18/2016  . Dyspnea    slight   . Dysrhythmia    atrial fib   . Esophageal cancer ([place])   . Esophageal stricture   . GERD (gastroesophageal reflux disease) 03/18/2016  . Hyperlipidemia   . Irregular heart rhythm   . Kidney carcinoma ([place])    right, sp ablation  . Macular degeneration   . Neuropathy   . Renal mass     PAST SURGICAL HISTORY: Past Surgical History:  Procedure Laterality Date  . BACK SURGERY    . CATARACT EXTRACTION    . CYSTOSCOPY W/ RETROGRADES Right 07/21/2016   Procedure: CYSTOSCOPY WITH RETROGRADE PYELOGRAM;  Surgeon: Hollice Espy, MD;  Location: [facility] OR[facility];  Service: Urology;  Laterality: Right;  . CYSTOSCOPY WITH STENT PLACEMENT Right 07/21/2016   Procedure: CYSTOSCOPY WITH STENT PLACEMENT;  Surgeon: Hollice Espy, MD;  Location: [facility] ORS;  Service: Urology;  Laterality: Right;  . IR RADIOLOGIST EVAL & MGMT  09/09/2016  . IR RADIOLOGIST EVAL & MGMT  06/19/2016  . KNEE SURGERY    . RADIOLOGY WITH ANESTHESIA N/A 07/23/2016   Procedure: RENAL CRYOABLATION;  Surgeon: Aletta Edouard, MD;  Location: [facility];  Service: Radiology;  Laterality: N/A;    FAMILY HISTORY: Family History  Problem Relation Age of Onset  . Ovarian cancer Sister   . Throat cancer Brother   . Diabetes Mother   . Kidney disease Father   . Prostate cancer Neg Hx   . Kidney cancer Neg Hx     ADVANCED DIRECTIVES (Y/N):  N  HEALTH MAINTENANCE: Social History  Substance Use Topics  . Smoking status: Former Smoker    Types: Cigarettes    Quit  date: 67  . Smokeless tobacco: Never Used  . Alcohol use No     Comment: quit 2012      Colonoscopy:  PAP:  Bone density:  Lipid panel:  No Known Allergies  Current Outpatient Prescriptions  Medication Sig Dispense Refill  . ADVAIR DISKUS 250-50 MCG/DOSE AEPB Inhale 1 puff into the lungs 2 (two) times daily. 180 each 3  . ALPRAZolam (XANAX) 0.5 MG tablet Take 1 tablet (0.5 mg total) by mouth 3 (three) times daily as  needed. for anxiety 90 tablet 0  . aspirin 81 MG chewable tablet Chew 1 tablet (81 mg total) by mouth daily. 30 tablet 5  . atorvastatin (LIPITOR) 40 MG tablet Take 1 tablet (40 mg total) by mouth daily. 90 tablet 3  . donepezil (ARICEPT) 10 MG tablet TAKE 1 TABLET AT BEDTIME 90 tablet 1  . fesoterodine (TOVIAZ) 8 MG TB24 tablet Take 1 tablet (8 mg total) by mouth daily. 90 tablet 2  . furosemide (LASIX) 20 MG tablet Take 1 tablet (20 mg total) by mouth daily. 90 tablet 3  . glucose blood test strip Use as instructed 100 each 12  . ketoconazole (NIZORAL) 2 % cream Apply 1 application topically daily. 60 g 1  . lidocaine-prilocaine (EMLA) cream Apply to affected area once 30 g 3  . megestrol (MEGACE) 40 MG tablet Take 1 tablet (40 mg total) by mouth daily. 30 tablet 2  . metFORMIN (GLUCOPHAGE) 1000 MG tablet Take 1,000 mg by mouth 2 (two) times daily with a meal.    . metoprolol tartrate (LOPRESSOR) 25 MG tablet Take 0.5 tablets (12.5 mg total) by mouth 2 (two) times daily. 60 tablet 6  . Multiple Vitamins-Minerals (CENTRUM SILVER PO) Take 1 tablet by mouth daily.    Marland Kitchen omeprazole (PRILOSEC) 20 MG capsule Take 1 capsule (20 mg total) by mouth 2 (two) times daily before a meal. 180 capsule 3  . ondansetron (ZOFRAN) 8 MG tablet Take 1 tablet (8 mg total) by mouth 2 (two) times daily as needed (Nausea or vomiting). 30 tablet 3  . polyethylene glycol (MIRALAX / GLYCOLAX) packet Take 17 g by mouth daily. 14 each 0  . prochlorperazine (COMPAZINE) 10 MG tablet Take 1 tablet (10 mg total) by mouth every 6 (six) hours as needed (Nausea or vomiting). 30 tablet 3  . QUEtiapine (SEROQUEL) 50 MG tablet Take 1 tablet (50 mg total) by mouth at bedtime. 90 tablet 1  . tamsulosin (FLOMAX) 0.4 MG CAPS capsule Take 0.4 mg by mouth daily.     No current facility-administered medications for this visit.    Facility-Administered Medications Ordered in Other Visits  Medication Dose Route Frequency Provider Last Rate  Last Dose  . 0.9 %  sodium chloride infusion   Intravenous Once Lloyd Huger, MD      . dexamethasone (DECADRON) 10 mg in sodium chloride 0.9 % 50 mL IVPB  10 mg Intravenous Once Lloyd Huger, MD      . dexamethasone (DECADRON) injection 10 mg  10 mg Intravenous Once Lloyd Huger, MD      . diphenhydrAMINE (BENADRYL) injection 25 mg  25 mg Intravenous Once Lloyd Huger, MD      . famotidine (PEPCID) IVPB 20 mg premix  20 mg Intravenous Once Lloyd Huger, MD      . PACLitaxel (TAXOL) 96 mg in dextrose 5 % 250 mL chemo infusion (</= 1m/m2)  40 mg/m2 (Treatment Plan Recorded) Intravenous Once FLloyd Huger  MD        OBJECTIVE: Vitals:   01/20/17 1036  BP: 108/70  Pulse: 87  Resp: 20  Temp: (!) 97.1 F (36.2 C)     Body mass index is 34.07 kg/m.    ECOG FS:0 - Asymptomatic  General: Well-developed, well-nourished, no acute distress. Eyes: Pink conjunctiva, anicteric sclera. HEENT: Normocephalic, moist mucous membranes, clear oropharnyx. Lungs: Clear to auscultation bilaterally. Heart: Regular rate and rhythm. No rubs, murmurs, or gallops. Abdomen: Soft, nontender, nondistended. No organomegaly noted, normoactive bowel sounds. Musculoskeletal: No edema, cyanosis, or clubbing. Neuro: Alert, answering all questions appropriately. Cranial nerves grossly intact. Skin: No rashes or petechiae noted. Psych: Normal affect.  LAB RESULTS:  Lab Results  Component Value Date   NA 136 01/20/2017   K 4.0 01/20/2017   CL 105 01/20/2017   CO2 22 01/20/2017   GLUCOSE 169 (H) 01/20/2017   BUN 25 (H) 01/20/2017   CREATININE 1.40 (H) 01/20/2017   CALCIUM 8.4 (L) 01/20/2017   PROT 6.5 01/20/2017   ALBUMIN 3.4 (L) 01/20/2017   AST 33 01/20/2017   ALT 17 01/20/2017   ALKPHOS 107 01/20/2017   BILITOT 0.8 01/20/2017   GFRNONAA 44 (L) 01/20/2017   GFRAA 51 (L) 01/20/2017    Lab Results  Component Value Date   WBC 3.5 (L) 01/20/2017   NEUTROABS 2.6  01/20/2017   HGB 11.8 (L) 01/20/2017   HCT 35.1 (L) 01/20/2017   MCV 93.8 01/20/2017   PLT 185 01/20/2017     STUDIES: No results found.  ONCOLOGY HISTORY: Initial diagnosis was in approximately October 2016. This was followed by concurrent chemotherapy with infusional 5-FU and XRT.  Patient received low-dose weekly carboplatinum and Taxol in approximately April and May 2017. He subsequently had a second recurrence and was initiated on Taxol 40 mg/m on days 1, 8, and 15 (Taxol was dose reduced 50% secondary to neuropathy) along with Ramucirumab 8 mg/m on days 1 and 15 starting September 27, 2015 through January 24, 2016. This was a 28 day cycle.   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 Recent biopsy of the lesion in his liver consistent with his original diagnosis. PET scan results from November 24, 2016 reviewed independently with widespread metastatic disease in his liver, lymph nodes, and bones. Given patient's pancytopenia, he likely can continue with single agent Taxol only. Proceed with cycle 2, day 8 of Taxol 40 mg/m only today. Ramucirumab has been discontinued. Will discontinue Xgeva given his recent hypocalcemia. Return to clinic in 1 week for further evaluation and consideration of cycle 2, day 15.  2. Neuropathy: Continue gabapentin and hydrocodone as prescribed. 3. Esophageal stricture: Patient has a history of multiple esophageal dilations. He reports he has a GI appointment near the end of October. 4. Right renal cell carcinoma: Patient had cryoablation performed on July 23, 2016. Repeat MRI November 03, 2016. Continue follow-up with interventional radiology as indicated. 5. Renal insufficiency: Patient's creatinine is approximately his baseline, monitor. 6. Bony metastatic disease: Discontinue Xgeva as above secondary to history of  hypocalcemia. 7. Chest pain: Patient does not complain of this today. Possibly related to his known esophageal stricture or bony disease. Continue follow-up with cardiology as indicated.  8. Poor appetite: Continue Megace. 9. Anxiety/insomnia: Continue Xanax. 10. Hypocalcemia: Significantly improved. Continue oral calcium supplementation. No Xgeva as above. 11. Neutropenia: Resolved.  Proceed with single agent Taxol as above.   Patient expressed understanding and was in agreement with this plan. He also understands that He can call clinic at any time with any questions, concerns, or complaints.   Cancer Staging Squamous cell esophageal cancer [facility]) Staging form: Esophagus - Squamous Cell Carcinoma, AJCC 7th Edition - Clinical stage from 02/27/2016: Stage IV (T3, N2, M1) - Signed by Lloyd Huger, MD on 12/02/2016   Lloyd Huger, MD   01/20/2017 10:59 AM

## 2017-01-20 ENCOUNTER — Inpatient Hospital Stay: Payer: Medicare Other

## 2017-01-20 ENCOUNTER — Inpatient Hospital Stay (HOSPITAL_BASED_OUTPATIENT_CLINIC_OR_DEPARTMENT_OTHER): Payer: Medicare Other | Admitting: Oncology

## 2017-01-20 ENCOUNTER — Ambulatory Visit: Payer: Medicare Other | Admitting: Gastroenterology

## 2017-01-20 VITALS — BP 108/70 | HR 87 | Temp 97.1°F | Resp 20 | Wt 244.2 lb

## 2017-01-20 DIAGNOSIS — C7951 Secondary malignant neoplasm of bone: Secondary | ICD-10-CM

## 2017-01-20 DIAGNOSIS — C159 Malignant neoplasm of esophagus, unspecified: Secondary | ICD-10-CM | POA: Diagnosis not present

## 2017-01-20 DIAGNOSIS — Z87891 Personal history of nicotine dependence: Secondary | ICD-10-CM

## 2017-01-20 DIAGNOSIS — Z79899 Other long term (current) drug therapy: Secondary | ICD-10-CM

## 2017-01-20 DIAGNOSIS — R1310 Dysphagia, unspecified: Secondary | ICD-10-CM

## 2017-01-20 DIAGNOSIS — R531 Weakness: Secondary | ICD-10-CM

## 2017-01-20 DIAGNOSIS — C787 Secondary malignant neoplasm of liver and intrahepatic bile duct: Secondary | ICD-10-CM | POA: Diagnosis not present

## 2017-01-20 DIAGNOSIS — Z7984 Long term (current) use of oral hypoglycemic drugs: Secondary | ICD-10-CM

## 2017-01-20 DIAGNOSIS — C641 Malignant neoplasm of right kidney, except renal pelvis: Secondary | ICD-10-CM

## 2017-01-20 DIAGNOSIS — R5382 Chronic fatigue, unspecified: Secondary | ICD-10-CM | POA: Diagnosis not present

## 2017-01-20 DIAGNOSIS — F0390 Unspecified dementia without behavioral disturbance: Secondary | ICD-10-CM

## 2017-01-20 DIAGNOSIS — D61818 Other pancytopenia: Secondary | ICD-10-CM | POA: Diagnosis not present

## 2017-01-20 DIAGNOSIS — R5381 Other malaise: Secondary | ICD-10-CM | POA: Diagnosis not present

## 2017-01-20 DIAGNOSIS — G4709 Other insomnia: Secondary | ICD-10-CM

## 2017-01-20 DIAGNOSIS — Z7982 Long term (current) use of aspirin: Secondary | ICD-10-CM

## 2017-01-20 DIAGNOSIS — D709 Neutropenia, unspecified: Secondary | ICD-10-CM | POA: Diagnosis not present

## 2017-01-20 DIAGNOSIS — Z5111 Encounter for antineoplastic chemotherapy: Secondary | ICD-10-CM | POA: Diagnosis not present

## 2017-01-20 DIAGNOSIS — K219 Gastro-esophageal reflux disease without esophagitis: Secondary | ICD-10-CM

## 2017-01-20 DIAGNOSIS — I4891 Unspecified atrial fibrillation: Secondary | ICD-10-CM

## 2017-01-20 DIAGNOSIS — K222 Esophageal obstruction: Secondary | ICD-10-CM | POA: Diagnosis not present

## 2017-01-20 DIAGNOSIS — H3530 Unspecified macular degeneration: Secondary | ICD-10-CM

## 2017-01-20 DIAGNOSIS — G629 Polyneuropathy, unspecified: Secondary | ICD-10-CM

## 2017-01-20 DIAGNOSIS — F419 Anxiety disorder, unspecified: Secondary | ICD-10-CM

## 2017-01-20 DIAGNOSIS — J449 Chronic obstructive pulmonary disease, unspecified: Secondary | ICD-10-CM

## 2017-01-20 DIAGNOSIS — E785 Hyperlipidemia, unspecified: Secondary | ICD-10-CM

## 2017-01-20 LAB — CBC WITH DIFFERENTIAL/PLATELET
BASOS ABS: 0 10*3/uL (ref 0–0.1)
Basophils Relative: 1 %
EOS PCT: 4 %
Eosinophils Absolute: 0.1 10*3/uL (ref 0–0.7)
HCT: 35.1 % — ABNORMAL LOW (ref 40.0–52.0)
Hemoglobin: 11.8 g/dL — ABNORMAL LOW (ref 13.0–18.0)
LYMPHS PCT: 17 %
Lymphs Abs: 0.6 10*3/uL — ABNORMAL LOW (ref 1.0–3.6)
MCH: 31.5 pg (ref 26.0–34.0)
MCHC: 33.5 g/dL (ref 32.0–36.0)
MCV: 93.8 fL (ref 80.0–100.0)
Monocytes Absolute: 0.2 10*3/uL (ref 0.2–1.0)
Monocytes Relative: 5 %
NEUTROS ABS: 2.6 10*3/uL (ref 1.4–6.5)
Neutrophils Relative %: 73 %
PLATELETS: 185 10*3/uL (ref 150–440)
RBC: 3.74 MIL/uL — AB (ref 4.40–5.90)
RDW: 16.6 % — ABNORMAL HIGH (ref 11.5–14.5)
WBC: 3.5 10*3/uL — AB (ref 3.8–10.6)

## 2017-01-20 LAB — COMPREHENSIVE METABOLIC PANEL
ALK PHOS: 107 U/L (ref 38–126)
ALT: 17 U/L (ref 17–63)
ANION GAP: 9 (ref 5–15)
AST: 33 U/L (ref 15–41)
Albumin: 3.4 g/dL — ABNORMAL LOW (ref 3.5–5.0)
BILIRUBIN TOTAL: 0.8 mg/dL (ref 0.3–1.2)
BUN: 25 mg/dL — ABNORMAL HIGH (ref 6–20)
CALCIUM: 8.4 mg/dL — AB (ref 8.9–10.3)
CO2: 22 mmol/L (ref 22–32)
Chloride: 105 mmol/L (ref 101–111)
Creatinine, Ser: 1.4 mg/dL — ABNORMAL HIGH (ref 0.61–1.24)
GFR, EST AFRICAN AMERICAN: 51 mL/min — AB (ref >60)
GFR, EST NON AFRICAN AMERICAN: 44 mL/min — AB (ref >60)
Glucose, Bld: 169 mg/dL — ABNORMAL HIGH (ref 65–99)
POTASSIUM: 4 mmol/L (ref 3.5–5.1)
Sodium: 136 mmol/L (ref 135–145)
TOTAL PROTEIN: 6.5 g/dL (ref 6.5–8.1)

## 2017-01-20 MED ORDER — ALPRAZOLAM 0.5 MG PO TABS: 0.5000 mg | ORAL_TABLET | Freq: Three times a day (TID) | ORAL | 0 refills | Status: AC | PRN

## 2017-01-20 MED ORDER — DEXTROSE 5 % IV SOLN
40.0000 mg/m2 | Freq: Once | INTRAVENOUS | Status: AC
  Administered 2017-01-20: 96 mg via INTRAVENOUS
  Filled 2017-01-20: qty 16

## 2017-01-20 MED ORDER — SODIUM CHLORIDE 0.9 % IV SOLN: 10.0000 mg | Freq: Once | INTRAVENOUS | Status: DC

## 2017-01-20 MED ORDER — DIPHENHYDRAMINE HCL 50 MG/ML IJ SOLN
25.0000 mg | Freq: Once | INTRAMUSCULAR | Status: AC
  Administered 2017-01-20: 25 mg via INTRAVENOUS
  Filled 2017-01-20: qty 1

## 2017-01-20 MED ORDER — HEPARIN SOD (PORK) LOCK FLUSH 100 UNIT/ML IV SOLN
500.0000 [IU] | Freq: Once | INTRAVENOUS | Status: AC | PRN
  Administered 2017-01-20: 500 [IU]

## 2017-01-20 MED ORDER — SODIUM CHLORIDE 0.9 % IV SOLN
Freq: Once | INTRAVENOUS | Status: AC
  Administered 2017-01-20: 11:00:00 via INTRAVENOUS
  Filled 2017-01-20: qty 1000

## 2017-01-20 MED ORDER — DEXAMETHASONE SODIUM PHOSPHATE 10 MG/ML IJ SOLN
10.0000 mg | Freq: Once | INTRAMUSCULAR | Status: AC
  Administered 2017-01-20: 10 mg via INTRAVENOUS
  Filled 2017-01-20: qty 1

## 2017-01-20 MED ORDER — FAMOTIDINE IN NACL 20-0.9 MG/50ML-% IV SOLN
20.0000 mg | Freq: Once | INTRAVENOUS | Status: AC
  Administered 2017-01-20: 20 mg via INTRAVENOUS
  Filled 2017-01-20: qty 50

## 2017-01-20 MED ORDER — HEPARIN SOD (PORK) LOCK FLUSH 100 UNIT/ML IV SOLN
INTRAVENOUS | Status: AC
  Filled 2017-01-20: qty 5

## 2017-01-20 NOTE — Progress Notes (Signed)
Pt in for follow up and treatment today.  Request Xanax refill.  Voiced no problems as this time.

## 2017-01-21 ENCOUNTER — Inpatient Hospital Stay: Payer: Medicare Other

## 2017-01-25 NOTE — Progress Notes (Signed)
[place]  [phone] [phone]  ID: Miguel [name]. 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 Patient felt overall weaker this past week, but otherwise feels well. He continues to have good days and bad days. He denies any pain. He has occasional dysphagia, but denies any difficulty swallowing. He has no neurologic complaints, although admits to worsening memory. He denies any recent fevers or illnesses. He continues to have chronic shortness of breath. He denies any nausea, vomiting, constipation, or diarrhea. He has no urinary complaints. Patient offers no further specific complaints today.  REVIEW OF SYSTEMS:   Review of Systems  Constitutional: Positive for malaise/fatigue. Negative for fever and weight loss.  HENT: Positive for sore throat.   Respiratory: Positive for shortness of breath. Negative for cough.   Cardiovascular: Negative for chest pain and leg swelling.  Gastrointestinal: Negative.  Negative for abdominal pain, heartburn, nausea and vomiting.  Genitourinary: Negative.   Musculoskeletal: Negative.   Skin: Negative.  Negative for rash.  Neurological: Positive for weakness. Negative for dizziness and sensory change.  Psychiatric/Behavioral: Positive for memory loss. The patient is not nervous/anxious and does not have insomnia.     As per HPI. Otherwise, a complete review of systems is negative.  PAST MEDICAL HISTORY: Past Medical History:  Diagnosis Date  . Alcoholism ([place])   . Anxiety   . COPD (chronic obstructive pulmonary disease) ([place])   . Dementia 03/18/2016  . DM type 2 with diabetic peripheral neuropathy ([place])  03/18/2016  . Dyspnea    slight   . Dysrhythmia    atrial fib   . Esophageal cancer ([place])   . Esophageal stricture   . GERD (gastroesophageal reflux disease) 03/18/2016  . Hyperlipidemia   . Irregular heart rhythm   . Kidney carcinoma ([place])    right, sp ablation  . Macular degeneration   . Neuropathy   . Renal mass     PAST SURGICAL HISTORY: Past Surgical History:  Procedure Laterality Date  . BACK SURGERY    . CATARACT EXTRACTION    . CYSTOSCOPY W/ RETROGRADES Right 07/21/2016   Procedure: CYSTOSCOPY WITH RETROGRADE PYELOGRAM;  Surgeon: Hollice Espy, MD;  Location: [facility] ORS;  Service: Urology;  Laterality: Right;  . CYSTOSCOPY WITH STENT PLACEMENT Right 07/21/2016   Procedure: CYSTOSCOPY WITH STENT PLACEMENT;  Surgeon: Hollice Espy, MD;  Location: [facility] OR[facility];  Service: Urology;  Laterality: Right;  . IR RADIOLOGIST EVAL & MGMT  09/09/2016  . IR RADIOLOGIST EVAL & MGMT  06/19/2016  . IR RADIOLOGIST EVAL & MGMT  11/12/2016  . KNEE SURGERY    . RADIOLOGY WITH ANESTHESIA N/A 07/23/2016   Procedure: RENAL CRYOABLATION;  Surgeon: Aletta Edouard, MD;  Location: [facility];  Service: Radiology;  Laterality: N/A;    FAMILY HISTORY: Family History  Problem Relation Age of Onset  . Ovarian cancer Sister   . Throat cancer Brother   . Diabetes Mother   . Kidney disease Father   . Prostate cancer Neg Hx   . Kidney cancer Neg Hx     ADVANCED DIRECTIVES (Y/N):  N  HEALTH MAINTENANCE: Social History  Substance Use Topics  . Smoking status: Former Smoker  Types: Cigarettes    Quit date: 29  . Smokeless tobacco: Never Used  . Alcohol use No     Comment: quit 2012      Colonoscopy:  PAP:  Bone density:  Lipid panel:  No Known Allergies  Current Outpatient Prescriptions  Medication Sig Dispense Refill  . ADVAIR DISKUS 250-50 MCG/DOSE AEPB Inhale 1 puff into the lungs 2 (two) times daily. 180 each 3  . ALPRAZolam (XANAX) 0.5 MG tablet Take 1 tablet (0.5 mg total) by mouth 3  (three) times daily as needed. for anxiety 90 tablet 0  . aspirin 81 MG chewable tablet Chew 1 tablet (81 mg total) by mouth daily. 30 tablet 5  . atorvastatin (LIPITOR) 40 MG tablet Take 1 tablet (40 mg total) by mouth daily. 90 tablet 3  . donepezil (ARICEPT) 10 MG tablet TAKE 1 TABLET AT BEDTIME 90 tablet 1  . fesoterodine (TOVIAZ) 8 MG TB24 tablet Take 1 tablet (8 mg total) by mouth daily. 90 tablet 2  . furosemide (LASIX) 20 MG tablet Take 1 tablet (20 mg total) by mouth daily. 90 tablet 3  . glucose blood test strip Use as instructed 100 each 12  . ketoconazole (NIZORAL) 2 % cream Apply 1 application topically daily. 60 g 1  . lidocaine-prilocaine (EMLA) cream Apply to affected area once 30 g 3  . megestrol (MEGACE) 40 MG tablet Take 1 tablet (40 mg total) by mouth daily. 30 tablet 2  . metFORMIN (GLUCOPHAGE) 1000 MG tablet Take 1,000 mg by mouth 2 (two) times daily with a meal.    . metoprolol tartrate (LOPRESSOR) 25 MG tablet Take 0.5 tablets (12.5 mg total) by mouth 2 (two) times daily. 60 tablet 6  . Multiple Vitamins-Minerals (CENTRUM SILVER PO) Take 1 tablet by mouth daily.    Marland Kitchen omeprazole (PRILOSEC) 20 MG capsule Take 1 capsule (20 mg total) by mouth 2 (two) times daily before a meal. 180 capsule 3  . ondansetron (ZOFRAN) 8 MG tablet Take 1 tablet (8 mg total) by mouth 2 (two) times daily as needed (Nausea or vomiting). 30 tablet 3  . polyethylene glycol (MIRALAX / GLYCOLAX) packet Take 17 g by mouth daily. 14 each 0  . prochlorperazine (COMPAZINE) 10 MG tablet Take 1 tablet (10 mg total) by mouth every 6 (six) hours as needed (Nausea or vomiting). 30 tablet 3  . QUEtiapine (SEROQUEL) 50 MG tablet Take 1 tablet (50 mg total) by mouth at bedtime. 90 tablet 1  . tamsulosin (FLOMAX) 0.4 MG CAPS capsule Take 0.4 mg by mouth daily.     No current facility-administered medications for this visit.    Facility-Administered Medications Ordered in Other Visits  Medication Dose Route  Frequency Provider Last Rate Last Dose  . heparin lock flush 100 unit/mL  500 Units Intravenous Once Lloyd Huger, MD      . PACLitaxel (TAXOL) 96 mg in dextrose 5 % 250 mL chemo infusion (</= 52m/m2)  40 mg/m2 (Treatment Plan Recorded) Intravenous Once FLloyd Huger MD 266 mL/hr at 01/27/17 1215 96 mg at 01/27/17 1215  . sodium chloride flush (NS) 0.9 % injection 10 mL  10 mL Intravenous PRN FLloyd Huger MD   10 mL at 01/27/17 1001    OBJECTIVE: Vitals:   01/27/17 1027  BP: 115/77  Pulse: (!) 101  Resp: 20  Temp: 97.6 F (36.4 C)     Body mass index is 33.39 kg/m.    ECOG FS:0 - Asymptomatic  General:  Well-developed, well-nourished, no acute distress. Eyes: Pink conjunctiva, anicteric sclera. HEENT: Normocephalic, moist mucous membranes, clear oropharnyx. Lungs: Clear to auscultation bilaterally. Heart: Regular rate and rhythm. No rubs, murmurs, or gallops. Abdomen: Soft, nontender, nondistended. No organomegaly noted, normoactive bowel sounds. Musculoskeletal: No edema, cyanosis, or clubbing. Neuro: Alert, answering all questions appropriately. Cranial nerves grossly intact. Skin: No rashes or petechiae noted. Psych: Normal affect.  LAB RESULTS:  Lab Results  Component Value Date   NA 135 01/27/2017   K 4.0 01/27/2017   CL 106 01/27/2017   CO2 19 (L) 01/27/2017   GLUCOSE 188 (H) 01/27/2017   BUN 22 (H) 01/27/2017   CREATININE 1.52 (H) 01/27/2017   CALCIUM 7.7 (L) 01/27/2017   PROT 6.5 01/27/2017   ALBUMIN 3.4 (L) 01/27/2017   AST 39 01/27/2017   ALT 21 01/27/2017   ALKPHOS 108 01/27/2017   BILITOT 0.8 01/27/2017   GFRNONAA 40 (L) 01/27/2017   GFRAA 46 (L) 01/27/2017    Lab Results  Component Value Date   WBC 1.7 (L) 01/27/2017   NEUTROABS 1.0 (L) 01/27/2017   HGB 10.9 (L) 01/27/2017   HCT 32.7 (L) 01/27/2017   MCV 94.8 01/27/2017   PLT 176 01/27/2017     STUDIES: No results found.  ONCOLOGY HISTORY: Initial diagnosis was in  approximately October 2016. This was followed by concurrent chemotherapy with infusional 5-FU and XRT.  Patient received low-dose weekly carboplatinum and Taxol in approximately April and May 2017. He subsequently had a second recurrence and was initiated on Taxol 40 mg/m on days 1, 8, and 15 (Taxol was dose reduced 50% secondary to neuropathy) along with Ramucirumab 8 mg/m on days 1 and 15 starting September 27, 2015 through January 24, 2016. This was a 28 day cycle.   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 Recent biopsy of the lesion in his liver consistent with his original diagnosis. PET scan results from November 24, 2016 reviewed independently with widespread metastatic disease in his liver, lymph nodes, and bones. Given patient's persistent pancytopenia, he likely can only continue with single agent Taxol only. Proceed with cycle 2, day 15 of Taxol 40 mg/m only today. Ramucirumab has been discontinued. Will discontinue Xgeva given his recent hypocalcemia. Return to clinic in 2 weeks for further evaluation and consideration of cycle 3, day 1. Will reimage with PET scan at the conclusion of cycle 3. 2. Neuropathy: Continue gabapentin and hydrocodone as prescribed. 3. Esophageal stricture: Patient has a history of multiple esophageal dilations. He reports he has a GI appointment near the end of October. 4. Right renal cell carcinoma: Patient had cryoablation performed on July 23, 2016. Repeat MRI November 03, 2016. Continue follow-up with interventional radiology as indicated. 5. Renal insufficiency: Patient's creatinine is approximately his baseline, monitor. 6. Bony metastatic disease: Discontinue Xgeva as above secondary to history of severe hypocalcemia. 7. Chest pain: Patient does not complain of this today. Possibly related to his known esophageal  stricture or bony disease. Continue follow-up with cardiology as indicated.  8. Poor appetite: Continue Megace. 9. Anxiety/insomnia: Continue Xanax. 10. Hypocalcemia: Significantly improved. Continue oral calcium supplementation. No Xgeva as above. 11. Neutropenia: Proceed with single agent Taxol as above.   Patient expressed understanding and was in agreement with this plan. He also understands that He can call clinic at any time with any questions, concerns, or  complaints.   Cancer Staging Squamous cell esophageal cancer [facility]) Staging form: Esophagus - Squamous Cell Carcinoma, AJCC 7th Edition - Clinical stage from 02/27/2016: Stage IV (T3, N2, M1) - Signed by Lloyd Huger, MD on 12/02/2016   Lloyd Huger, MD   01/27/2017 12:40 PM

## 2017-01-26 ENCOUNTER — Encounter: Payer: Self-pay | Admitting: Interventional Radiology

## 2017-01-27 ENCOUNTER — Inpatient Hospital Stay: Payer: Medicare Other

## 2017-01-27 ENCOUNTER — Inpatient Hospital Stay (HOSPITAL_BASED_OUTPATIENT_CLINIC_OR_DEPARTMENT_OTHER): Payer: Medicare Other | Admitting: Oncology

## 2017-01-27 ENCOUNTER — Other Ambulatory Visit: Payer: Self-pay | Admitting: Oncology

## 2017-01-27 VITALS — BP 115/77 | HR 101 | Temp 97.6°F | Resp 20 | Wt 239.4 lb

## 2017-01-27 DIAGNOSIS — N289 Disorder of kidney and ureter, unspecified: Secondary | ICD-10-CM

## 2017-01-27 DIAGNOSIS — F419 Anxiety disorder, unspecified: Secondary | ICD-10-CM | POA: Diagnosis not present

## 2017-01-27 DIAGNOSIS — Z5111 Encounter for antineoplastic chemotherapy: Secondary | ICD-10-CM | POA: Diagnosis not present

## 2017-01-27 DIAGNOSIS — C641 Malignant neoplasm of right kidney, except renal pelvis: Secondary | ICD-10-CM | POA: Diagnosis not present

## 2017-01-27 DIAGNOSIS — C7951 Secondary malignant neoplasm of bone: Secondary | ICD-10-CM | POA: Diagnosis not present

## 2017-01-27 DIAGNOSIS — C787 Secondary malignant neoplasm of liver and intrahepatic bile duct: Secondary | ICD-10-CM | POA: Diagnosis not present

## 2017-01-27 DIAGNOSIS — C159 Malignant neoplasm of esophagus, unspecified: Secondary | ICD-10-CM

## 2017-01-27 DIAGNOSIS — Z79899 Other long term (current) drug therapy: Secondary | ICD-10-CM | POA: Diagnosis not present

## 2017-01-27 DIAGNOSIS — D709 Neutropenia, unspecified: Secondary | ICD-10-CM

## 2017-01-27 DIAGNOSIS — C779 Secondary and unspecified malignant neoplasm of lymph node, unspecified: Secondary | ICD-10-CM | POA: Diagnosis not present

## 2017-01-27 LAB — COMPREHENSIVE METABOLIC PANEL
ALBUMIN: 3.4 g/dL — AB (ref 3.5–5.0)
ALK PHOS: 108 U/L (ref 38–126)
ALT: 21 U/L (ref 17–63)
ANION GAP: 10 (ref 5–15)
AST: 39 U/L (ref 15–41)
BILIRUBIN TOTAL: 0.8 mg/dL (ref 0.3–1.2)
BUN: 22 mg/dL — ABNORMAL HIGH (ref 6–20)
CALCIUM: 7.7 mg/dL — AB (ref 8.9–10.3)
CO2: 19 mmol/L — ABNORMAL LOW (ref 22–32)
Chloride: 106 mmol/L (ref 101–111)
Creatinine, Ser: 1.52 mg/dL — ABNORMAL HIGH (ref 0.61–1.24)
GFR calc Af Amer: 46 mL/min — ABNORMAL LOW (ref >60)
GFR calc non Af Amer: 40 mL/min — ABNORMAL LOW (ref >60)
GLUCOSE: 188 mg/dL — AB (ref 65–99)
POTASSIUM: 4 mmol/L (ref 3.5–5.1)
Sodium: 135 mmol/L (ref 135–145)
TOTAL PROTEIN: 6.5 g/dL (ref 6.5–8.1)

## 2017-01-27 LAB — CBC WITH DIFFERENTIAL/PLATELET
BASOS ABS: 0 10*3/uL (ref 0–0.1)
BASOS PCT: 2 %
Eosinophils Absolute: 0.1 10*3/uL (ref 0–0.7)
Eosinophils Relative: 4 %
HEMATOCRIT: 32.7 % — AB (ref 40.0–52.0)
HEMOGLOBIN: 10.9 g/dL — AB (ref 13.0–18.0)
Lymphocytes Relative: 28 %
Lymphs Abs: 0.5 10*3/uL — ABNORMAL LOW (ref 1.0–3.6)
MCH: 31.5 pg (ref 26.0–34.0)
MCHC: 33.3 g/dL (ref 32.0–36.0)
MCV: 94.8 fL (ref 80.0–100.0)
Monocytes Absolute: 0.1 10*3/uL — ABNORMAL LOW (ref 0.2–1.0)
Monocytes Relative: 8 %
NEUTROS ABS: 1 10*3/uL — AB (ref 1.4–6.5)
NEUTROS PCT: 58 %
Platelets: 176 10*3/uL (ref 150–440)
RBC: 3.45 MIL/uL — ABNORMAL LOW (ref 4.40–5.90)
RDW: 16.5 % — AB (ref 11.5–14.5)
WBC: 1.7 10*3/uL — ABNORMAL LOW (ref 3.8–10.6)

## 2017-01-27 MED ORDER — DIPHENHYDRAMINE HCL 50 MG/ML IJ SOLN
25.0000 mg | Freq: Once | INTRAMUSCULAR | Status: AC
  Administered 2017-01-27: 25 mg via INTRAVENOUS
  Filled 2017-01-27: qty 1

## 2017-01-27 MED ORDER — ACETAMINOPHEN 325 MG PO TABS
650.0000 mg | ORAL_TABLET | Freq: Once | ORAL | Status: AC
  Administered 2017-01-27: 650 mg via ORAL
  Filled 2017-01-27: qty 2

## 2017-01-27 MED ORDER — DEXAMETHASONE SODIUM PHOSPHATE 10 MG/ML IJ SOLN
10.0000 mg | Freq: Once | INTRAMUSCULAR | Status: AC
Start: 2017-01-27 — End: 2017-01-27
  Administered 2017-01-27: 10 mg via INTRAVENOUS
  Filled 2017-01-27: qty 1

## 2017-01-27 MED ORDER — SODIUM CHLORIDE 0.9% FLUSH
10.0000 mL | INTRAVENOUS | Status: DC | PRN
  Administered 2017-01-27: 10 mL via INTRAVENOUS
  Filled 2017-01-27: qty 10

## 2017-01-27 MED ORDER — SODIUM CHLORIDE 0.9 % IV SOLN
Freq: Once | INTRAVENOUS | Status: AC
  Administered 2017-01-27: 12:00:00 via INTRAVENOUS
  Filled 2017-01-27: qty 1000

## 2017-01-27 MED ORDER — HEPARIN SOD (PORK) LOCK FLUSH 100 UNIT/ML IV SOLN
500.0000 [IU] | Freq: Once | INTRAVENOUS | Status: AC
  Administered 2017-01-27: 500 [IU] via INTRAVENOUS
  Filled 2017-01-27: qty 5

## 2017-01-27 MED ORDER — PACLITAXEL CHEMO INJECTION 300 MG/50ML
40.0000 mg/m2 | Freq: Once | INTRAVENOUS | Status: AC
  Administered 2017-01-27: 96 mg via INTRAVENOUS
  Filled 2017-01-27: qty 16

## 2017-01-27 MED ORDER — FAMOTIDINE IN NACL 20-0.9 MG/50ML-% IV SOLN
20.0000 mg | Freq: Once | INTRAVENOUS | Status: AC
  Administered 2017-01-27: 20 mg via INTRAVENOUS
  Filled 2017-01-27: qty 50

## 2017-01-27 NOTE — Progress Notes (Signed)
Creatinine 1.52. Per Dr Grayland Ormond proceed with taxol today

## 2017-01-27 NOTE — Progress Notes (Signed)
Pt in for follow today with wife and son.  Reports falling 2 nights ago, EMS had to come and get patient up out of floor.  Large bruise to left hand, skin tear to left elbow.  Wife reports 2 small pressure sores to buttocks that she is concerned about.

## 2017-01-27 NOTE — Progress Notes (Signed)
CR =1.52, ok to proceed with treatment

## 2017-01-28 ENCOUNTER — Other Ambulatory Visit: Payer: Self-pay | Admitting: Family Medicine

## 2017-01-28 NOTE — Telephone Encounter (Signed)
Send 90 day supply with refills [facility]  metFORMIN (GLUCOPHAGE) 1000 MG tablet #90

## 2017-01-28 NOTE — Telephone Encounter (Signed)
Last Ov 11/26/2016 filed under historical

## 2017-01-29 ENCOUNTER — Ambulatory Visit (INDEPENDENT_AMBULATORY_CARE_PROVIDER_SITE_OTHER): Payer: Medicare Other | Admitting: Podiatry

## 2017-01-29 ENCOUNTER — Encounter: Payer: Self-pay | Admitting: Podiatry

## 2017-01-29 DIAGNOSIS — M79676 Pain in unspecified toe(s): Secondary | ICD-10-CM

## 2017-01-29 DIAGNOSIS — B351 Tinea unguium: Secondary | ICD-10-CM

## 2017-01-29 DIAGNOSIS — E1142 Type 2 diabetes mellitus with diabetic polyneuropathy: Secondary | ICD-10-CM

## 2017-01-29 NOTE — Telephone Encounter (Signed)
Please confirm dosage with patient. Thanks.

## 2017-01-29 NOTE — Progress Notes (Addendum)
Complaint:  Visit Type: Patient returns to my office for continued preventative foot care services. Complaint: Patient states" my nails have grown long and thick and become painful to walk and wear shoes" Patient has been diagnosed with DM with neuropathy. The patient presents for preventative foot care services. No changes to ROS  Podiatric Exam: Vascular: dorsalis pedis and posterior tibial pulses are palpable bilateral. Capillary return is immediate. Temperature gradient is WNL. Skin turgor WNL  Sensorium: Normal Semmes Weinstein monofilament test. Normal tactile sensation bilaterally. Nail Exam: Pt has thick disfigured discolored nails with subungual debris noted bilateral entire nail hallux through fifth toenails Ulcer Exam: There is no evidence of ulcer or pre-ulcerative changes or infection. Orthopedic Exam: Muscle tone and strength are WNL. No limitations in general ROM. No crepitus or effusions noted. Foot type and digits show no abnormalities. Bony prominences are unremarkable. Skin: No Porokeratosis. No infection or ulcers  Diagnosis:  Onychomycosis, , Pain in right toe, pain in left toes  Treatment & Plan Procedures and Treatment: Consent by patient was obtained for treatment procedures. The patient understood the discussion of treatment and procedures well. All questions were answered thoroughly reviewed. Debridement of mycotic and hypertrophic toenails, 1 through 5 bilateral and clearing of subungual debris. No ulceration, no infection noted. Signed ABN for 2018.  Return Visit-Office Procedure: Patient instructed to return to the office for a follow up visit 3 months for continued evaluation and treatment.    Gardiner Barefoot DPM

## 2017-01-31 ENCOUNTER — Inpatient Hospital Stay
Admission: EM | Admit: 2017-01-31 | Discharge: 2017-02-08 | DRG: 641 | Disposition: A | Discharge status: Hospice | Payer: Medicare Other | Attending: Internal Medicine | Admitting: Internal Medicine

## 2017-01-31 ENCOUNTER — Emergency Department: Payer: Medicare Other

## 2017-01-31 ENCOUNTER — Other Ambulatory Visit: Payer: Self-pay

## 2017-01-31 DIAGNOSIS — F1021 Alcohol dependence, in remission: Secondary | ICD-10-CM | POA: Diagnosis present

## 2017-01-31 DIAGNOSIS — E876 Hypokalemia: Secondary | ICD-10-CM | POA: Diagnosis present

## 2017-01-31 DIAGNOSIS — Z66 Do not resuscitate: Secondary | ICD-10-CM | POA: Diagnosis present

## 2017-01-31 DIAGNOSIS — R531 Weakness: Secondary | ICD-10-CM | POA: Diagnosis not present

## 2017-01-31 DIAGNOSIS — D701 Agranulocytosis secondary to cancer chemotherapy: Secondary | ICD-10-CM | POA: Diagnosis present

## 2017-01-31 DIAGNOSIS — I472 Ventricular tachycardia: Secondary | ICD-10-CM | POA: Diagnosis not present

## 2017-01-31 DIAGNOSIS — C787 Secondary malignant neoplasm of liver and intrahepatic bile duct: Secondary | ICD-10-CM | POA: Diagnosis present

## 2017-01-31 DIAGNOSIS — I4891 Unspecified atrial fibrillation: Secondary | ICD-10-CM | POA: Diagnosis present

## 2017-01-31 DIAGNOSIS — G629 Polyneuropathy, unspecified: Secondary | ICD-10-CM | POA: Diagnosis present

## 2017-01-31 DIAGNOSIS — T451X5S Adverse effect of antineoplastic and immunosuppressive drugs, sequela: Secondary | ICD-10-CM | POA: Diagnosis not present

## 2017-01-31 DIAGNOSIS — Z7984 Long term (current) use of oral hypoglycemic drugs: Secondary | ICD-10-CM

## 2017-01-31 DIAGNOSIS — T451X5A Adverse effect of antineoplastic and immunosuppressive drugs, initial encounter: Secondary | ICD-10-CM | POA: Diagnosis present

## 2017-01-31 DIAGNOSIS — Z7982 Long term (current) use of aspirin: Secondary | ICD-10-CM

## 2017-01-31 DIAGNOSIS — Z7951 Long term (current) use of inhaled steroids: Secondary | ICD-10-CM

## 2017-01-31 DIAGNOSIS — R5381 Other malaise: Secondary | ICD-10-CM | POA: Diagnosis not present

## 2017-01-31 DIAGNOSIS — I481 Persistent atrial fibrillation: Secondary | ICD-10-CM | POA: Diagnosis present

## 2017-01-31 DIAGNOSIS — C641 Malignant neoplasm of right kidney, except renal pelvis: Secondary | ICD-10-CM | POA: Diagnosis not present

## 2017-01-31 DIAGNOSIS — D638 Anemia in other chronic diseases classified elsewhere: Secondary | ICD-10-CM | POA: Diagnosis present

## 2017-01-31 DIAGNOSIS — E1142 Type 2 diabetes mellitus with diabetic polyneuropathy: Secondary | ICD-10-CM | POA: Diagnosis present

## 2017-01-31 DIAGNOSIS — Z87891 Personal history of nicotine dependence: Secondary | ICD-10-CM

## 2017-01-31 DIAGNOSIS — E785 Hyperlipidemia, unspecified: Secondary | ICD-10-CM | POA: Diagnosis present

## 2017-01-31 DIAGNOSIS — E860 Dehydration: Principal | ICD-10-CM | POA: Diagnosis present

## 2017-01-31 DIAGNOSIS — N183 Chronic kidney disease, stage 3 unspecified: Secondary | ICD-10-CM | POA: Diagnosis present

## 2017-01-31 DIAGNOSIS — J449 Chronic obstructive pulmonary disease, unspecified: Secondary | ICD-10-CM | POA: Diagnosis present

## 2017-01-31 DIAGNOSIS — Z515 Encounter for palliative care: Secondary | ICD-10-CM | POA: Diagnosis present

## 2017-01-31 DIAGNOSIS — R627 Adult failure to thrive: Secondary | ICD-10-CM | POA: Diagnosis present

## 2017-01-31 DIAGNOSIS — I340 Nonrheumatic mitral (valve) insufficiency: Secondary | ICD-10-CM | POA: Diagnosis not present

## 2017-01-31 DIAGNOSIS — R410 Disorientation, unspecified: Secondary | ICD-10-CM | POA: Diagnosis present

## 2017-01-31 DIAGNOSIS — R197 Diarrhea, unspecified: Secondary | ICD-10-CM | POA: Diagnosis present

## 2017-01-31 DIAGNOSIS — H3530 Unspecified macular degeneration: Secondary | ICD-10-CM | POA: Diagnosis present

## 2017-01-31 DIAGNOSIS — E1122 Type 2 diabetes mellitus with diabetic chronic kidney disease: Secondary | ICD-10-CM | POA: Diagnosis present

## 2017-01-31 DIAGNOSIS — Z85528 Personal history of other malignant neoplasm of kidney: Secondary | ICD-10-CM

## 2017-01-31 DIAGNOSIS — C7951 Secondary malignant neoplasm of bone: Secondary | ICD-10-CM | POA: Diagnosis present

## 2017-01-31 DIAGNOSIS — C159 Malignant neoplasm of esophagus, unspecified: Secondary | ICD-10-CM | POA: Diagnosis present

## 2017-01-31 DIAGNOSIS — K219 Gastro-esophageal reflux disease without esophagitis: Secondary | ICD-10-CM | POA: Diagnosis present

## 2017-01-31 DIAGNOSIS — F0390 Unspecified dementia without behavioral disturbance: Secondary | ICD-10-CM | POA: Diagnosis present

## 2017-01-31 DIAGNOSIS — F419 Anxiety disorder, unspecified: Secondary | ICD-10-CM | POA: Diagnosis present

## 2017-01-31 DIAGNOSIS — Z7189 Other specified counseling: Secondary | ICD-10-CM

## 2017-01-31 DIAGNOSIS — I5030 Unspecified diastolic (congestive) heart failure: Secondary | ICD-10-CM | POA: Diagnosis present

## 2017-01-31 DIAGNOSIS — R5383 Other fatigue: Secondary | ICD-10-CM | POA: Diagnosis not present

## 2017-01-31 DIAGNOSIS — R413 Other amnesia: Secondary | ICD-10-CM | POA: Diagnosis not present

## 2017-01-31 DIAGNOSIS — D709 Neutropenia, unspecified: Secondary | ICD-10-CM | POA: Diagnosis present

## 2017-01-31 DIAGNOSIS — R630 Anorexia: Secondary | ICD-10-CM | POA: Diagnosis not present

## 2017-01-31 DIAGNOSIS — B370 Candidal stomatitis: Secondary | ICD-10-CM | POA: Diagnosis present

## 2017-01-31 DIAGNOSIS — Z9221 Personal history of antineoplastic chemotherapy: Secondary | ICD-10-CM | POA: Diagnosis not present

## 2017-01-31 DIAGNOSIS — L8990 Pressure ulcer of unspecified site, unspecified stage: Secondary | ICD-10-CM | POA: Insufficient documentation

## 2017-01-31 DIAGNOSIS — Z79899 Other long term (current) drug therapy: Secondary | ICD-10-CM

## 2017-01-31 HISTORY — DX: Other persistent atrial fibrillation: I48.19

## 2017-01-31 LAB — COMPREHENSIVE METABOLIC PANEL
ALT: 23 U/L (ref 17–63)
AST: 31 U/L (ref 15–41)
Albumin: 3.4 g/dL — ABNORMAL LOW (ref 3.5–5.0)
Alkaline Phosphatase: 93 U/L (ref 38–126)
Anion gap: 9 (ref 5–15)
BUN: 25 mg/dL — ABNORMAL HIGH (ref 6–20)
CALCIUM: 8 mg/dL — AB (ref 8.9–10.3)
CHLORIDE: 104 mmol/L (ref 101–111)
CO2: 24 mmol/L (ref 22–32)
CREATININE: 1.42 mg/dL — AB (ref 0.61–1.24)
GFR, EST AFRICAN AMERICAN: 50 mL/min — AB (ref >60)
GFR, EST NON AFRICAN AMERICAN: 43 mL/min — AB (ref >60)
Glucose, Bld: 141 mg/dL — ABNORMAL HIGH (ref 65–99)
Potassium: 4.1 mmol/L (ref 3.5–5.1)
Sodium: 137 mmol/L (ref 135–145)
TOTAL PROTEIN: 6.7 g/dL (ref 6.5–8.1)
Total Bilirubin: 1.5 mg/dL — ABNORMAL HIGH (ref 0.3–1.2)

## 2017-01-31 LAB — URINALYSIS, ROUTINE W REFLEX MICROSCOPIC
Bilirubin Urine: NEGATIVE
Glucose, UA: NEGATIVE mg/dL
Hgb urine dipstick: NEGATIVE
KETONES UR: NEGATIVE mg/dL
LEUKOCYTES UA: NEGATIVE
Nitrite: NEGATIVE
PROTEIN: NEGATIVE mg/dL
Specific Gravity, Urine: 1.015 (ref 1.005–1.030)
pH: 5 (ref 5.0–8.0)

## 2017-01-31 LAB — CBC WITH DIFFERENTIAL/PLATELET
BASOS ABS: 0 10*3/uL (ref 0–0.1)
Basophils Relative: 2 %
EOS ABS: 0 10*3/uL (ref 0–0.7)
Eosinophils Relative: 2 %
HCT: 32.5 % — ABNORMAL LOW (ref 40.0–52.0)
Hemoglobin: 11.3 g/dL — ABNORMAL LOW (ref 13.0–18.0)
Lymphocytes Relative: 34 %
Lymphs Abs: 0.2 10*3/uL — ABNORMAL LOW (ref 1.0–3.6)
MCH: 32.4 pg (ref 26.0–34.0)
MCHC: 34.7 g/dL (ref 32.0–36.0)
MCV: 93.6 fL (ref 80.0–100.0)
MONO ABS: 0 10*3/uL — AB (ref 0.2–1.0)
Monocytes Relative: 8 %
NEUTROS PCT: 54 %
Neutro Abs: 0.4 10*3/uL — ABNORMAL LOW (ref 1.4–6.5)
PLATELETS: 199 10*3/uL (ref 150–440)
RBC: 3.48 MIL/uL — AB (ref 4.40–5.90)
RDW: 16.9 % — ABNORMAL HIGH (ref 11.5–14.5)
WBC: 0.6 10*3/uL — AB (ref 3.8–10.6)

## 2017-01-31 LAB — LACTIC ACID, PLASMA: LACTIC ACID, VENOUS: 1.7 mmol/L (ref 0.5–1.9)

## 2017-01-31 LAB — TROPONIN I: Troponin I: 0.03 ng/mL (ref <0.03)

## 2017-01-31 LAB — GLUCOSE, CAPILLARY: Glucose-Capillary: 128 mg/dL — ABNORMAL HIGH (ref 65–99)

## 2017-01-31 LAB — LIPASE, BLOOD: LIPASE: 17 U/L (ref 11–51)

## 2017-01-31 MED ORDER — METOPROLOL TARTRATE 25 MG PO TABS
12.5000 mg | ORAL_TABLET | Freq: Two times a day (BID) | ORAL | Status: DC
  Administered 2017-01-31 – 2017-02-02 (×4): 12.5 mg via ORAL
  Filled 2017-01-31 (×4): qty 1

## 2017-01-31 MED ORDER — ACETAMINOPHEN 650 MG RE SUPP: 650.0000 mg | Freq: Four times a day (QID) | RECTAL | Status: DC | PRN

## 2017-01-31 MED ORDER — INSULIN ASPART 100 UNIT/ML ~~LOC~~ SOLN: 0.0000 [IU] | Freq: Every day | SUBCUTANEOUS | Status: DC

## 2017-01-31 MED ORDER — SODIUM CHLORIDE 0.9 % IV SOLN
INTRAVENOUS | Status: AC
  Administered 2017-01-31: via INTRAVENOUS

## 2017-01-31 MED ORDER — QUETIAPINE FUMARATE 25 MG PO TABS
50.0000 mg | ORAL_TABLET | Freq: Every day | ORAL | Status: DC
  Administered 2017-01-31 – 2017-02-04 (×5): 50 mg via ORAL
  Filled 2017-01-31 (×5): qty 2

## 2017-01-31 MED ORDER — METOPROLOL TARTRATE 5 MG/5ML IV SOLN
2.5000 mg | Freq: Once | INTRAVENOUS | Status: AC
  Administered 2017-01-31: 2.5 mg via INTRAVENOUS
  Filled 2017-01-31: qty 5

## 2017-01-31 MED ORDER — ONDANSETRON HCL 4 MG PO TABS: 4.0000 mg | ORAL_TABLET | Freq: Four times a day (QID) | ORAL | Status: DC | PRN

## 2017-01-31 MED ORDER — PANTOPRAZOLE SODIUM 40 MG PO TBEC
40.0000 mg | DELAYED_RELEASE_TABLET | Freq: Two times a day (BID) | ORAL | Status: DC
  Administered 2017-01-31 – 2017-02-05 (×10): 40 mg via ORAL
  Filled 2017-01-31 (×10): qty 1

## 2017-01-31 MED ORDER — SODIUM CHLORIDE 0.9 % IV BOLUS (SEPSIS)
500.0000 mL | Freq: Once | INTRAVENOUS | Status: AC
  Administered 2017-01-31: 500 mL via INTRAVENOUS

## 2017-01-31 MED ORDER — ONDANSETRON HCL 4 MG/2ML IJ SOLN
4.0000 mg | Freq: Four times a day (QID) | INTRAMUSCULAR | Status: DC | PRN
  Administered 2017-02-02: 4 mg via INTRAVENOUS
  Filled 2017-01-31: qty 2

## 2017-01-31 MED ORDER — MOMETASONE FURO-FORMOTEROL FUM 200-5 MCG/ACT IN AERO
2.0000 | INHALATION_SPRAY | Freq: Two times a day (BID) | RESPIRATORY_TRACT | Status: DC
  Administered 2017-01-31 – 2017-02-08 (×15): 2 via RESPIRATORY_TRACT
  Filled 2017-01-31: qty 8.8

## 2017-01-31 MED ORDER — INSULIN ASPART 100 UNIT/ML ~~LOC~~ SOLN
0.0000 [IU] | Freq: Three times a day (TID) | SUBCUTANEOUS | Status: DC
  Administered 2017-02-01 – 2017-02-02 (×2): 1 [IU] via SUBCUTANEOUS
  Administered 2017-02-02: 2 [IU] via SUBCUTANEOUS
  Administered 2017-02-02 – 2017-02-03 (×4): 1 [IU] via SUBCUTANEOUS
  Administered 2017-02-04: 2 [IU] via SUBCUTANEOUS
  Administered 2017-02-04 – 2017-02-05 (×2): 1 [IU] via SUBCUTANEOUS
  Filled 2017-01-31 (×10): qty 1

## 2017-01-31 MED ORDER — ASPIRIN 81 MG PO CHEW
81.0000 mg | CHEWABLE_TABLET | Freq: Every day | ORAL | Status: DC
  Administered 2017-01-31 – 2017-02-05 (×6): 81 mg via ORAL
  Filled 2017-01-31 (×6): qty 1

## 2017-01-31 MED ORDER — MEGESTROL ACETATE 20 MG PO TABS
40.0000 mg | ORAL_TABLET | Freq: Every day | ORAL | Status: DC
  Administered 2017-02-01 – 2017-02-06 (×6): 40 mg via ORAL
  Filled 2017-01-31 (×2): qty 2
  Filled 2017-01-31: qty 1
  Filled 2017-01-31 (×3): qty 2

## 2017-01-31 MED ORDER — ALPRAZOLAM 0.5 MG PO TABS
0.5000 mg | ORAL_TABLET | Freq: Three times a day (TID) | ORAL | Status: DC | PRN
  Administered 2017-02-03 – 2017-02-04 (×3): 0.5 mg via ORAL
  Filled 2017-01-31 (×4): qty 1

## 2017-01-31 MED ORDER — PANTOPRAZOLE SODIUM 20 MG PO TBEC
20.0000 mg | DELAYED_RELEASE_TABLET | Freq: Two times a day (BID) | ORAL | Status: DC
  Filled 2017-01-31: qty 1

## 2017-01-31 MED ORDER — ATORVASTATIN CALCIUM 20 MG PO TABS
40.0000 mg | ORAL_TABLET | Freq: Every day | ORAL | Status: DC
  Administered 2017-02-01 – 2017-02-05 (×5): 40 mg via ORAL
  Filled 2017-01-31 (×5): qty 2

## 2017-01-31 MED ORDER — TAMSULOSIN HCL 0.4 MG PO CAPS
0.4000 mg | ORAL_CAPSULE | Freq: Every day | ORAL | Status: DC
  Administered 2017-02-01 – 2017-02-05 (×4): 0.4 mg via ORAL
  Filled 2017-01-31 (×5): qty 1

## 2017-01-31 MED ORDER — ACETAMINOPHEN 325 MG PO TABS
650.0000 mg | ORAL_TABLET | Freq: Four times a day (QID) | ORAL | Status: DC | PRN
  Administered 2017-02-01 – 2017-02-06 (×3): 650 mg via ORAL
  Filled 2017-01-31 (×3): qty 2

## 2017-01-31 MED ORDER — DONEPEZIL HCL 5 MG PO TABS
10.0000 mg | ORAL_TABLET | Freq: Every day | ORAL | Status: DC
  Administered 2017-01-31 – 2017-02-04 (×5): 10 mg via ORAL
  Filled 2017-01-31 (×6): qty 2

## 2017-01-31 MED ORDER — ALPRAZOLAM 0.5 MG PO TABS
0.5000 mg | ORAL_TABLET | Freq: Once | ORAL | Status: AC
  Administered 2017-01-31: 0.5 mg via ORAL
  Filled 2017-01-31: qty 1

## 2017-01-31 MED ORDER — ENOXAPARIN SODIUM 40 MG/0.4ML ~~LOC~~ SOLN
40.0000 mg | SUBCUTANEOUS | Status: DC
  Administered 2017-01-31 – 2017-02-04 (×5): 40 mg via SUBCUTANEOUS
  Filled 2017-01-31 (×5): qty 0.4

## 2017-01-31 NOTE — ED Provider Notes (Signed)
[facility] Emergency Department Provider Note  ____________________________________________   First MD Initiated Contact with Patient 01/31/17 1718     (approximate)  I have reviewed the triage vital signs and the nursing notes.   HISTORY  Chief Complaint Weakness; Shortness of Breath; and Diarrhea EM caveat: Patient with mild dementia.  History provided is from his wife and son primarily  HPI Miguel [name]. is a 83 y.o. male history of stage IV cancer, actively on chemotherapy  Family reports that he has had a slight shortness of breath with significant weakness worsening over the last 4-5 days.  Seems to have started just after chemotherapy.  He also had some slight more confusion than his baseline.  He has not had a fever, but family has reported that he is felt warm.  He usually runs a slightly lower temperature at home.  He had some slight chills this evening.  No nausea or vomiting.  He has had occasionally loose stools after chemo.  He also reports a decreased appetite is not been taking by mouth well, his wife has really had to encourage him to take fluids and food.   Past Medical History:  Diagnosis Date  . Alcoholism ([place])   . Anxiety   . COPD (chronic obstructive pulmonary disease) ([place])   . Dementia 03/18/2016  . DM type 2 with diabetic peripheral neuropathy ([place]) 03/18/2016  . Dyspnea    slight   . Dysrhythmia    atrial fib   . Esophageal cancer ([place])   . Esophageal stricture   . GERD (gastroesophageal reflux disease) 03/18/2016  . Hyperlipidemia   . Irregular heart rhythm   . Kidney carcinoma ([place])    right, sp ablation  . Macular degeneration   . Neuropathy   . Renal mass     Patient Active Problem List   Diagnosis Date Noted  . Goals of care, counseling/discussion 12/26/2016  . Mobility impaired 11/26/2016  . Chronic pain of right knee 10/28/2016  . (HFpEF) heart failure with preserved ejection fraction ([place]) 07/03/2016    . Atrial fibrillation ([place]) 06/06/2016  . CKD (chronic kidney disease), stage III ([place]) 06/06/2016  . Right renal mass 05/12/2016  . DM type 2 with diabetic peripheral neuropathy ([place]) 03/18/2016  . COPD (chronic obstructive pulmonary disease) ([place]) 03/18/2016  . Anxiety 03/18/2016  . Dementia 03/18/2016  . GERD without esophagitis 03/18/2016  . Macular degeneration 03/18/2016  . 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 OSA (obstructive sleep apnea) 01/22/2015    Past Surgical History:  Procedure Laterality Date  . BACK SURGERY    . CATARACT EXTRACTION    . CYSTOSCOPY W/ RETROGRADES Right 07/21/2016   Procedure: CYSTOSCOPY WITH RETROGRADE PYELOGRAM;  Surgeon: Hollice Espy, MD;  Location: [facility] ORS;  Service: Urology;  Laterality: Right;  . CYSTOSCOPY WITH STENT PLACEMENT Right 07/21/2016   Procedure: CYSTOSCOPY WITH STENT PLACEMENT;  Surgeon: Hollice Espy, MD;  Location: [facility] ORS;  Service: Urology;  Laterality: Right;  . IR RADIOLOGIST EVAL & MGMT  09/09/2016  . IR RADIOLOGIST EVAL & MGMT  06/19/2016  . IR RADIOLOGIST EVAL & MGMT  11/12/2016  . KNEE SURGERY    . RADIOLOGY WITH ANESTHESIA N/A 07/23/2016   Procedure: RENAL CRYOABLATION;  Surgeon: Aletta Edouard, MD;  Location: [facility] ORS;  Service: Radiology;  Laterality: N/A;    Prior to Admission medications   Medication Sig Start Date End Date Taking? Authorizing Provider  ADVAIR DISKUS 250-50 MCG/DOSE AEPB Inhale 1 puff  into the lungs 2 (two) times daily. 10/27/16   Coral Spikes, DO  ALPRAZolam Duanne Moron) 0.5 MG tablet Take 1 tablet (0.5 mg total) by mouth 3 (three) times daily as needed. for anxiety 01/20/17   Lloyd Huger, MD  aspirin 81 MG chewable tablet Chew 1 tablet (81 mg total) by mouth daily. 06/07/16   Theodoro Grist, MD  atorvastatin (LIPITOR) 40 MG tablet Take 1 tablet (40 mg total) by mouth daily. 03/20/16   Coral Spikes, DO  donepezil (ARICEPT) 10 MG tablet TAKE 1 TABLET AT BEDTIME 11/06/16   Coral Spikes, DO   fesoterodine (TOVIAZ) 8 MG TB24 tablet Take 1 tablet (8 mg total) by mouth daily. 08/27/16   Coral Spikes, DO  furosemide (LASIX) 20 MG tablet Take 1 tablet (20 mg total) by mouth daily. 12/18/16   Thersa Salt G, DO  glucose blood test strip Use as instructed 12/18/16   Coral Spikes, DO  ketoconazole (NIZORAL) 2 % cream Apply 1 application topically daily. 03/25/16   Coral Spikes, DO  lidocaine-prilocaine (EMLA) cream Apply to affected area once 12/05/16   Lloyd Huger, MD  megestrol (MEGACE) 40 MG tablet Take 1 tablet (40 mg total) by mouth daily. 12/23/16   Lloyd Huger, MD  metFORMIN (GLUCOPHAGE) 1000 MG tablet Take 1,000 mg by mouth 2 (two) times daily with a meal.    [provider]  metoprolol tartrate (LOPRESSOR) 25 MG tablet Take 0.5 tablets (12.5 mg total) by mouth 2 (two) times daily. 06/06/16   Theodoro Grist, MD  Multiple Vitamins-Minerals (CENTRUM SILVER PO) Take 1 tablet by mouth daily.    [provider]  omeprazole (PRILOSEC) 20 MG capsule Take 1 capsule (20 mg total) by mouth 2 (two) times daily before a meal. 03/18/16   Cook, Jayce G, DO  ondansetron (ZOFRAN) 8 MG tablet Take 1 tablet (8 mg total) by mouth 2 (two) times daily as needed (Nausea or vomiting). 12/05/16   Lloyd Huger, MD  polyethylene glycol (MIRALAX / Floria Raveling) packet Take 17 g by mouth daily. 06/07/16   Theodoro Grist, MD  prochlorperazine (COMPAZINE) 10 MG tablet Take 1 tablet (10 mg total) by mouth every 6 (six) hours as needed (Nausea or vomiting). 12/05/16   Lloyd Huger, MD  QUEtiapine (SEROQUEL) 50 MG tablet Take 1 tablet (50 mg total) by mouth at bedtime. 11/06/16   Coral Spikes, DO  tamsulosin (FLOMAX) 0.4 MG CAPS capsule Take 0.4 mg by mouth daily.    [provider]    Allergies Patient has no known allergies.  Family History  Problem Relation Age of Onset  . Ovarian cancer Sister   . Throat cancer Brother   . Diabetes Mother   . Kidney disease Father    . Prostate cancer Neg Hx   . Kidney cancer Neg Hx     Social History Social History  Substance Use Topics  . Smoking status: Former Smoker    Types: Cigarettes    Quit date: 38  . Smokeless tobacco: Never Used  . Alcohol use No     Comment: quit 2012     Review of Systems Constitutional: No fever/chills, see HPI however Eyes: No visual changes. ENT: No sore throat. Cardiovascular: Denies chest pain. Respiratory: He has noted occasional shortness of breath, especially with walking.  Currently he denies shortness of breath Gastrointestinal: No abdominal pain.  No nausea, no vomiting.  No constipation. Genitourinary: Negative for dysuria.  He  has occasional incontinence which is not new Musculoskeletal: Negative for back pain. Skin: Negative for rash. Neurological: Negative for headaches, focal weakness or numbness.  He has been generally weak all over    ____________________________________________   PHYSICAL EXAM:  VITAL SIGNS: ED Triage Vitals  Enc Vitals Group     BP 01/31/17 1713 120/63     Pulse Rate 01/31/17 1713 (!) 127     Resp 01/31/17 1713 20     Temp 01/31/17 1713 98.2 F (36.8 C)     Temp Source 01/31/17 1713 Oral     SpO2 01/31/17 1713 97 %     Weight 01/31/17 1714 240 lb (108.9 kg)     Height 01/31/17 1714 6\' 1"  (1.854 m)     Head Circumference --      Peak Flow --      Pain Score 01/31/17 1713 0     Pain Loc --      Pain Edu? --      Excl. in [place]? --     Constitutional: Alert and oriented to self but not to date.  He recognizes family. Well appearing and in no acute distress.  Ends. Eyes: Conjunctivae are normal. Head: Atraumatic. Nose: No congestion/rhinnorhea. Mouth/Throat: Mucous membranes are slightly dry. Neck: No stridor.   Cardiovascular: Normal rate, regular rhythm. Grossly normal heart sounds.  Good peripheral circulation. Respiratory: Normal respiratory effort.  No retractions. Lungs CTAB. Gastrointestinal: Soft and nontender.  No distention. Musculoskeletal: No lower extremity tenderness nor edema. Neurologic:  Normal speech and language. No gross focal neurologic deficits are appreciated.  He appears generally weak without focal deficit in all extremities about 4 out of 5. Skin:  Skin is warm, dry and intact. No rash noted. Psychiatric: Mood and affect are normal. Speech and behavior are normal.  ____________________________________________   LABS (all labs ordered are listed, but only abnormal results are displayed)  Labs Reviewed  COMPREHENSIVE METABOLIC PANEL - Abnormal; Notable for the following:       Result Value   Glucose, Bld 141 (*)    BUN 25 (*)    Creatinine, Ser 1.42 (*)    Calcium 8.0 (*)    Albumin 3.4 (*)    Total Bilirubin 1.5 (*)    GFR calc non Af Amer 43 (*)    GFR calc Af Amer 50 (*)    All other components within normal limits  CBC WITH DIFFERENTIAL/PLATELET - Abnormal; Notable for the following:    WBC 0.6 (*)    RBC 3.48 (*)    Hemoglobin 11.3 (*)    HCT 32.5 (*)    RDW 16.9 (*)    Neutro Abs 0.4 (*)    Lymphs Abs 0.2 (*)    Monocytes Absolute 0.0 (*)    All other components within normal limits  URINALYSIS, ROUTINE W REFLEX MICROSCOPIC - Abnormal; Notable for the following:    Color, Urine YELLOW (*)    APPearance CLEAR (*)    All other components within normal limits  TROPONIN I - Abnormal; Notable for the following:    Troponin I 0.03 (*)    All other components within normal limits  CULTURE, BLOOD (ROUTINE X 2)  CULTURE, BLOOD (ROUTINE X 2)  URINE CULTURE  LACTIC ACID, PLASMA  LIPASE, BLOOD  LACTIC ACID, PLASMA   ____________________________________________  EKG  Reviewed and entered by me at 1725 Heart rate 125 Cures 100 QTC 460 Atrial fibrillation with rapid ventricular response, nonspecific T wave abnormality. ____________________________________________  RADIOLOGY  Ct Head Wo Contrast  Result Date: 01/31/2017 CLINICAL DATA:  Altered level of  consciousness. 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 Patient is on chemotherapy and also has dementia. Dyspnea, tachycardia and diarrhea times 4-5 days. EXAM: CT HEAD WITHOUT CONTRAST TECHNIQUE: Contiguous axial images were obtained from the base of the skull through the vertex without intravenous contrast. COMPARISON:  None. FINDINGS: Brain: No evidence of acute infarction, hemorrhage, hydrocephalus, extra-axial collection or mass lesion/mass effect. Generalized superficial and central atrophy with mild to moderate small vessel ischemic disease of periventricular white matter. No acute intracranial hemorrhage, midline shift or edema. Basal ganglial calcifications are noted bilaterally. No intra-axial mass nor extra-axial fluid collections. The basal cisterns and fourth ventricle are midline without effacement. Mild cerebellar atrophy is noted as well. Vascular: No hyperdense vessels. ) Sclerosis the carotid siphons. Skull: No fracture or suspicious osseous lesions. Sinuses/Orbits: Status post bilateral cataract extractions. Intact globes. No retrobulbar abnormality. Clear paranasal sinuses. Other: Clear mastoids. IMPRESSION: Atrophy with chronic appearing small vessel ischemic disease. No acute intracranial abnormality noted. Electronically Signed   By: Ashley Royalty M.D.   On: 01/31/2017 19:30   Dg Chest Port 1 View  Result Date: 01/31/2017 CLINICAL DATA:  Weakness and shortness of breath for 5 days. Ex-smoker. Diabetes. EXAM: PORTABLE CHEST 1 VIEW COMPARISON:  PET of 12/08/2016.  Chest radiograph of 07/18/2016 FINDINGS: Two frontal radiographs. A right Port-A-Cath terminates at the low SVC. Remote right rib trauma. Midline trachea. Mild cardiomegaly with transverse aortic atherosclerosis. Possible residual small left pleural effusion. No pneumothorax. Mild hyperinflation. No lobar consolidation. Mild left base scarring. IMPRESSION: No  acute cardiopulmonary disease. Cardiomegaly.  Aortic Atherosclerosis (ICD10-I70.0). Possible small left pleural effusion. Electronically Signed   By: Abigail Miyamoto M.D.   On: 01/31/2017 17:47     CT of the head demonstrates no acute.  Reviewed CT of the head, also chest x-ray which was noted to have a small left pleural effusion ____________________________________________   PROCEDURES  Procedure(s) performed: None  Procedures  Critical Care performed: No  ____________________________________________   INITIAL IMPRESSION / ASSESSMENT AND PLAN / ED COURSE  Pertinent labs & imaging results that were available during my care of the patient were reviewed by me and considered in my medical decision making (see chart for details).  Patient presents for increasing generalized weakness, loose stools at home.  Noted to have mild tachycardia as well a slight increase in his confusion reported by family.  They report he is also had slight shortness of breath, and all the seems to be tied to starting and progressing after his last chemotherapy.  Clinical Course as of Feb 01 2008  Sat Jan 31, 2017  1807 Discussed case and care with the patient's son and his wife were at the bedside provide history.  They report he had some slight confusion also noticed over the last 4-5 days.  He fell about a week ago and injured his left hand, but was told no concerns for broken bones.  He had chemotherapy just a few days ago and reports that around that time his not wanting to eat or drink anything, has had loose stools after that and they are concerned he may very well be dehydrated.  [MQ]    Clinical Course User Index [MQ] Delman Kitten, MD   Case discussed with Dr. Janese Banks recommends the patient be admitted for observation overnight at present.  If he develops any fever  than she would recommend initiating antibiotics.  Initially started as a code sepsis, but no source is revealed at this time.  Blood cultures and  urine culture are pending, based on discussion with oncology I do not find a source at present and will withhold antibiotic.  Further workup and the patient will be seen by her internal medicine for further treatment recommendations and observation.  Patient with mild improvement in rate after metoprolol.  Blood pressure 110 over 90s, continue to watch the patient and observe him closely.  Thus far no obvious etiology for the patient's slowly worsening confusion and loose stools.  I suspect this may be related to dehydration and his chemotherapy, but the patient will be observed and seen by the hospitalist service for further workup ____________________________________________   FINAL CLINICAL IMPRESSION(S) / ED DIAGNOSES  Final diagnoses:  Generalized weakness  Chemotherapy-induced neutropenia ([facility])  Atrial fibrillation with rapid ventricular response ([place])  Confusion with non-focal neuro exam      NEW MEDICATIONS STARTED DURING THIS VISIT:  New Prescriptions   No medications on file     Note:  This document was prepared using Dragon voice recognition software and may include unintentional dictation errors.     Delman Kitten, MD 01/31/17 2009

## 2017-01-31 NOTE — ED Notes (Signed)
Attempted to call report; RN informed that she is unable to take report at this time, she took the phone number of this RN and stated she will call back for the report.

## 2017-01-31 NOTE — ED Notes (Signed)
Pt placed on Neutropenic precautions.

## 2017-01-31 NOTE — H&P (Signed)
[facility] [place] NAME: Miguel [name]    MR#:  [id number]  DATE OF BIRTH:  [birth year]  DATE OF ADMISSION:  01/31/2017  PRIMARY CARE PHYSICIAN: Leone Haven, MD   REQUESTING/REFERRING PHYSICIAN: Jacqualine Code, MD  CHIEF COMPLAINT:   Chief Complaint  Patient presents with  . Weakness  . Shortness of Breath  . Diarrhea    HISTORY OF PRESENT ILLNESS:  Miguel [name]  is a 84 y.o. male who presents with weakness, mildly increased confusion, A. fib with RVR. Patient's history is given mostly by his wife is a patient is unable to contribute very clearly to his own history of present illness. Wife states that he got chemotherapy earlier this week. She states he typically will get somewhat weak after chemotherapy, but this time he has gotten much weaker than normal. He also is more confused than normal. He has had significant diarrhea and is somewhat dehydrated, has had poor by mouth appetite. Here in the ED is in A. fib with RVR, he has neutropenia, but workup otherwise is within normal limits. Hospitals were called for admission  PAST MEDICAL HISTORY:   Past Medical History:  Diagnosis Date  . Alcoholism ([place])   . Anxiety   . COPD (chronic obstructive pulmonary disease) ([place])   . Dementia 03/18/2016  . DM type 2 with diabetic peripheral neuropathy ([place]) 03/18/2016  . Dyspnea    slight   . Dysrhythmia    atrial fib   . Esophageal cancer ([place])   . Esophageal stricture   . GERD (gastroesophageal reflux disease) 03/18/2016  . Hyperlipidemia   . Irregular heart rhythm   . Kidney carcinoma ([place])    right, sp ablation  . Macular degeneration   . Neuropathy   . Renal mass     PAST SURGICAL HISTORY:   Past Surgical History:  Procedure Laterality Date  . BACK SURGERY    . CATARACT EXTRACTION    . CYSTOSCOPY W/ RETROGRADES Right 07/21/2016   Procedure: CYSTOSCOPY WITH RETROGRADE PYELOGRAM;  Surgeon: Hollice Espy, MD;  Location: [facility]  ORS;  Service: Urology;  Laterality: Right;  . CYSTOSCOPY WITH STENT PLACEMENT Right 07/21/2016   Procedure: CYSTOSCOPY WITH STENT PLACEMENT;  Surgeon: Hollice Espy, MD;  Location: [facility] ORS;  Service: Urology;  Laterality: Right;  . IR RADIOLOGIST EVAL & MGMT  09/09/2016  . IR RADIOLOGIST EVAL & MGMT  06/19/2016  . IR RADIOLOGIST EVAL & MGMT  11/12/2016  . KNEE SURGERY    . RADIOLOGY WITH ANESTHESIA N/A 07/23/2016   Procedure: RENAL CRYOABLATION;  Surgeon: Aletta Edouard, MD;  Location: [facility] ORS;  Service: Radiology;  Laterality: N/A;    SOCIAL HISTORY:   Social History  Substance Use Topics  . Smoking status: Former Smoker    Types: Cigarettes    Quit date: 56  . Smokeless tobacco: Never Used  . Alcohol use No     Comment: quit 2012     FAMILY HISTORY:   Family History  Problem Relation Age of Onset  . Ovarian cancer Sister   . Throat cancer Brother   . Diabetes Mother   . Kidney disease Father   . Prostate cancer Neg Hx   . Kidney cancer Neg Hx     DRUG ALLERGIES:  No Known Allergies  MEDICATIONS AT HOME:   Prior to Admission medications   Medication Sig Start Date End Date Taking? Authorizing Provider  ADVAIR DISKUS 250-50 MCG/DOSE AEPB Inhale 1 puff into the  lungs 2 (two) times daily. 10/27/16   Coral Spikes, DO  ALPRAZolam Duanne Moron) 0.5 MG tablet Take 1 tablet (0.5 mg total) by mouth 3 (three) times daily as needed. for anxiety 01/20/17   Lloyd Huger, MD  aspirin 81 MG chewable tablet Chew 1 tablet (81 mg total) by mouth daily. 06/07/16   Theodoro Grist, MD  atorvastatin (LIPITOR) 40 MG tablet Take 1 tablet (40 mg total) by mouth daily. 03/20/16   Coral Spikes, DO  donepezil (ARICEPT) 10 MG tablet TAKE 1 TABLET AT BEDTIME 11/06/16   Coral Spikes, DO  fesoterodine (TOVIAZ) 8 MG TB24 tablet Take 1 tablet (8 mg total) by mouth daily. 08/27/16   Coral Spikes, DO  furosemide (LASIX) 20 MG tablet Take 1 tablet (20 mg total) by mouth daily. 12/18/16   Thersa Salt G, DO   glucose blood test strip Use as instructed 12/18/16   Coral Spikes, DO  ketoconazole (NIZORAL) 2 % cream Apply 1 application topically daily. 03/25/16   Coral Spikes, DO  lidocaine-prilocaine (EMLA) cream Apply to affected area once 12/05/16   Lloyd Huger, MD  megestrol (MEGACE) 40 MG tablet Take 1 tablet (40 mg total) by mouth daily. 12/23/16   Lloyd Huger, MD  metFORMIN (GLUCOPHAGE) 1000 MG tablet Take 1,000 mg by mouth 2 (two) times daily with a meal.    [provider]  metoprolol tartrate (LOPRESSOR) 25 MG tablet Take 0.5 tablets (12.5 mg total) by mouth 2 (two) times daily. 06/06/16   Theodoro Grist, MD  Multiple Vitamins-Minerals (CENTRUM SILVER PO) Take 1 tablet by mouth daily.    [provider]  omeprazole (PRILOSEC) 20 MG capsule Take 1 capsule (20 mg total) by mouth 2 (two) times daily before a meal. 03/18/16   Cook, Jayce G, DO  ondansetron (ZOFRAN) 8 MG tablet Take 1 tablet (8 mg total) by mouth 2 (two) times daily as needed (Nausea or vomiting). 12/05/16   Lloyd Huger, MD  polyethylene glycol (MIRALAX / Floria Raveling) packet Take 17 g by mouth daily. 06/07/16   Theodoro Grist, MD  prochlorperazine (COMPAZINE) 10 MG tablet Take 1 tablet (10 mg total) by mouth every 6 (six) hours as needed (Nausea or vomiting). 12/05/16   Lloyd Huger, MD  QUEtiapine (SEROQUEL) 50 MG tablet Take 1 tablet (50 mg total) by mouth at bedtime. 11/06/16   Coral Spikes, DO  tamsulosin (FLOMAX) 0.4 MG CAPS capsule Take 0.4 mg by mouth daily.    [provider]    REVIEW OF SYSTEMS:  Review of Systems  Unable to perform ROS: Acuity of condition     VITAL SIGNS:   Vitals:   01/31/17 1714 01/31/17 1905 01/31/17 1906 01/31/17 1931  BP:  128/65  (!) 111/94  Pulse:   (!) 125   Resp:  (!) 24 (!) 22 (!) 29  Temp:      TempSrc:      SpO2:   100%   Weight: 108.9 kg (240 lb)     Height: 6\' 1"  (1.854 m)      Wt Readings from Last 3 Encounters:  01/31/17 108.9  kg (240 lb)  01/27/17 108.6 kg (239 lb 6 oz)  01/20/17 110.8 kg (244 lb 4 oz)    PHYSICAL EXAMINATION:  Physical Exam  Vitals reviewed. Constitutional: He appears well-developed and well-nourished. No distress.  HENT:  Head: Normocephalic and atraumatic.  Mouth/Throat: Oropharynx is clear and moist.  Eyes: Pupils are equal,  round, and reactive to light. Conjunctivae and EOM are normal. No scleral icterus.  Neck: Normal range of motion. Neck supple. No JVD present. No thyromegaly present.  Cardiovascular: Intact distal pulses.  Exam reveals no gallop and no friction rub.   No murmur heard. Tachycardic, irregular rhythm  Respiratory: Effort normal and breath sounds normal. No respiratory distress. He has no wheezes. He has no rales.  GI: Soft. Bowel sounds are normal. He exhibits no distension. There is no tenderness.  Musculoskeletal: Normal range of motion. He exhibits no edema.  No arthritis, no gout  Lymphadenopathy:    He has no cervical adenopathy.  Neurological: He is alert. No cranial nerve deficit.  No dysarthria, no aphasia  Skin: Skin is warm and dry. No rash noted. No erythema.  Psychiatric:  Unable to assess due to patient condition    LABORATORY PANEL:   CBC  Recent Labs Lab 01/31/17 1730  WBC 0.6*  HGB 11.3*  HCT 32.5*  PLT 199   ------------------------------------------------------------------------------------------------------------------  Chemistries   Recent Labs Lab 01/31/17 1730  NA 137  K 4.1  CL 104  CO2 24  GLUCOSE 141*  BUN 25*  CREATININE 1.42*  CALCIUM 8.0*  AST 31  ALT 23  ALKPHOS 93  BILITOT 1.5*   ------------------------------------------------------------------------------------------------------------------  Cardiac Enzymes  Recent Labs Lab 01/31/17 1730  TROPONINI 0.03*   ------------------------------------------------------------------------------------------------------------------  RADIOLOGY:  Ct Head Wo  Contrast  Result Date: 01/31/2017 CLINICAL DATA:  Altered level of consciousness. 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 Patient is on chemotherapy and also has dementia. Dyspnea, tachycardia and diarrhea times 4-5 days. EXAM: CT HEAD WITHOUT CONTRAST TECHNIQUE: Contiguous axial images were obtained from the base of the skull through the vertex without intravenous contrast. COMPARISON:  None. FINDINGS: Brain: No evidence of acute infarction, hemorrhage, hydrocephalus, extra-axial collection or mass lesion/mass effect. Generalized superficial and central atrophy with mild to moderate small vessel ischemic disease of periventricular white matter. No acute intracranial hemorrhage, midline shift or edema. Basal ganglial calcifications are noted bilaterally. No intra-axial mass nor extra-axial fluid collections. The basal cisterns and fourth ventricle are midline without effacement. Mild cerebellar atrophy is noted as well. Vascular: No hyperdense vessels. ) Sclerosis the carotid siphons. Skull: No fracture or suspicious osseous lesions. Sinuses/Orbits: Status post bilateral cataract extractions. Intact globes. No retrobulbar abnormality. Clear paranasal sinuses. Other: Clear mastoids. IMPRESSION: Atrophy with chronic appearing small vessel ischemic disease. No acute intracranial abnormality noted. Electronically Signed   By: Ashley Royalty M.D.   On: 01/31/2017 19:30   Dg Chest Port 1 View  Result Date: 01/31/2017 CLINICAL DATA:  Weakness and shortness of breath for 5 days. Ex-smoker. Diabetes. EXAM: PORTABLE CHEST 1 VIEW COMPARISON:  PET of 12/08/2016.  Chest radiograph of 07/18/2016 FINDINGS: Two frontal radiographs. A right Port-A-Cath terminates at the low SVC. Remote right rib trauma. Midline trachea. Mild cardiomegaly with transverse aortic atherosclerosis. Possible residual small left pleural effusion. No pneumothorax. Mild  hyperinflation. No lobar consolidation. Mild left base scarring. IMPRESSION: No acute cardiopulmonary disease. Cardiomegaly.  Aortic Atherosclerosis (ICD10-I70.0). Possible small left pleural effusion. Electronically Signed   By: Abigail Miyamoto M.D.   On: 01/31/2017 17:47    EKG:   Orders placed or performed during the hospital encounter of 01/31/17  . ED EKG 12-Lead  . ED EKG 12-Lead    IMPRESSION AND PLAN:  Principal Problem:   General weakness - likely due to deconditioning, his chemotherapy, profound  dehydration, poor by mouth intake. We will hydrate him tonight, and treat other conditions as below Active Problems:   Atrial fibrillation with RVR ([place]) - IV Lopressor has improved his rate somewhat, we will continue this tonight and continue his home meds for rate control as well.   Squamous cell esophageal cancer (HCC) - oncology consult   DM type 2 with diabetic peripheral neuropathy (HCC) - siding scale insulin with corresponding glucose checks   COPD (chronic obstructive pulmonary disease) ([place]) - continue home meds   (HFpEF) heart failure with preserved ejection fraction ([place]) - continue home meds, cautious administration of IV fluids as above   GERD without esophagitis - home dose PPI   CKD (chronic kidney disease), stage III ([place]) - avoid nephrotoxins, monitor  All the records are reviewed and case discussed with ED provider. Management plans discussed with the patient and/or family.  DVT PROPHYLAXIS: SubQ lovenox  GI PROPHYLAXIS: PPI  ADMISSION STATUS: Inpatient  CODE STATUS: Full Code Status History    Date Active Date Inactive Code Status Order ID Comments User Context   06/03/2016  3:33 PM 06/06/2016  6:46 PM Full Code [id number]  Dustin Flock, MD Inpatient      TOTAL TIME TAKING CARE OF THIS PATIENT: 45 minutes.   Rich Paprocki [place] 01/31/2017, 9:28 PM  [employer]  [phone]  CC: Primary care physician; Leone Haven,  MD  Note:  This document was prepared using Dragon voice recognition software and may include unintentional dictation errors.

## 2017-01-31 NOTE — ED Triage Notes (Signed)
Pt arrives to ED via [facility] from [place] with c/o weakness, SHOB, tachycardia, and diarrhea x4-5 days. Pt lives with wife and has h/x of dementia. Pt denies any c/o pain at this time. Dr Jacqualine Code at bedside upon pt's arrival to ED Rm 4.

## 2017-02-01 ENCOUNTER — Other Ambulatory Visit: Payer: Self-pay | Admitting: Oncology

## 2017-02-01 DIAGNOSIS — G4733 Obstructive sleep apnea (adult) (pediatric): Secondary | ICD-10-CM

## 2017-02-01 DIAGNOSIS — B370 Candidal stomatitis: Secondary | ICD-10-CM

## 2017-02-01 DIAGNOSIS — D701 Agranulocytosis secondary to cancer chemotherapy: Secondary | ICD-10-CM

## 2017-02-01 DIAGNOSIS — K219 Gastro-esophageal reflux disease without esophagitis: Secondary | ICD-10-CM

## 2017-02-01 DIAGNOSIS — R5381 Other malaise: Secondary | ICD-10-CM

## 2017-02-01 DIAGNOSIS — R5383 Other fatigue: Secondary | ICD-10-CM

## 2017-02-01 DIAGNOSIS — R531 Weakness: Secondary | ICD-10-CM

## 2017-02-01 DIAGNOSIS — C787 Secondary malignant neoplasm of liver and intrahepatic bile duct: Secondary | ICD-10-CM

## 2017-02-01 DIAGNOSIS — Z800 Family history of malignant neoplasm of digestive organs: Secondary | ICD-10-CM

## 2017-02-01 DIAGNOSIS — C641 Malignant neoplasm of right kidney, except renal pelvis: Secondary | ICD-10-CM

## 2017-02-01 DIAGNOSIS — Z79899 Other long term (current) drug therapy: Secondary | ICD-10-CM

## 2017-02-01 DIAGNOSIS — F0390 Unspecified dementia without behavioral disturbance: Secondary | ICD-10-CM

## 2017-02-01 DIAGNOSIS — C159 Malignant neoplasm of esophagus, unspecified: Secondary | ICD-10-CM

## 2017-02-01 DIAGNOSIS — I4891 Unspecified atrial fibrillation: Secondary | ICD-10-CM

## 2017-02-01 DIAGNOSIS — N183 Chronic kidney disease, stage 3 (moderate): Secondary | ICD-10-CM

## 2017-02-01 DIAGNOSIS — Z9221 Personal history of antineoplastic chemotherapy: Secondary | ICD-10-CM

## 2017-02-01 DIAGNOSIS — C7951 Secondary malignant neoplasm of bone: Secondary | ICD-10-CM

## 2017-02-01 DIAGNOSIS — Z87891 Personal history of nicotine dependence: Secondary | ICD-10-CM

## 2017-02-01 DIAGNOSIS — E1122 Type 2 diabetes mellitus with diabetic chronic kidney disease: Secondary | ICD-10-CM

## 2017-02-01 DIAGNOSIS — E860 Dehydration: Principal | ICD-10-CM

## 2017-02-01 DIAGNOSIS — L8990 Pressure ulcer of unspecified site, unspecified stage: Secondary | ICD-10-CM | POA: Insufficient documentation

## 2017-02-01 DIAGNOSIS — J449 Chronic obstructive pulmonary disease, unspecified: Secondary | ICD-10-CM

## 2017-02-01 DIAGNOSIS — Z8041 Family history of malignant neoplasm of ovary: Secondary | ICD-10-CM

## 2017-02-01 DIAGNOSIS — T451X5S Adverse effect of antineoplastic and immunosuppressive drugs, sequela: Secondary | ICD-10-CM

## 2017-02-01 DIAGNOSIS — R197 Diarrhea, unspecified: Secondary | ICD-10-CM

## 2017-02-01 LAB — CBC
HEMATOCRIT: 30.9 % — AB (ref 40.0–52.0)
Hemoglobin: 10.4 g/dL — ABNORMAL LOW (ref 13.0–18.0)
MCH: 31.6 pg (ref 26.0–34.0)
MCHC: 33.5 g/dL (ref 32.0–36.0)
MCV: 94.3 fL (ref 80.0–100.0)
Platelets: 151 10*3/uL (ref 150–440)
RBC: 3.28 MIL/uL — ABNORMAL LOW (ref 4.40–5.90)
RDW: 17.1 % — AB (ref 11.5–14.5)
WBC: 0.9 10*3/uL — CL (ref 3.8–10.6)

## 2017-02-01 LAB — BASIC METABOLIC PANEL
Anion gap: 9 (ref 5–15)
BUN: 26 mg/dL — AB (ref 6–20)
CALCIUM: 7 mg/dL — AB (ref 8.9–10.3)
CO2: 19 mmol/L — ABNORMAL LOW (ref 22–32)
CREATININE: 1.61 mg/dL — AB (ref 0.61–1.24)
Chloride: 108 mmol/L (ref 101–111)
GFR calc Af Amer: 43 mL/min — ABNORMAL LOW (ref >60)
GFR, EST NON AFRICAN AMERICAN: 37 mL/min — AB (ref >60)
GLUCOSE: 125 mg/dL — AB (ref 65–99)
POTASSIUM: 3.7 mmol/L (ref 3.5–5.1)
Sodium: 136 mmol/L (ref 135–145)

## 2017-02-01 LAB — GLUCOSE, CAPILLARY
GLUCOSE-CAPILLARY: 121 mg/dL — AB (ref 65–99)
GLUCOSE-CAPILLARY: 127 mg/dL — AB (ref 65–99)
GLUCOSE-CAPILLARY: 117 mg/dL — AB (ref 65–99)
Glucose-Capillary: 133 mg/dL — ABNORMAL HIGH (ref 65–99)

## 2017-02-01 MED ORDER — METOPROLOL TARTRATE 5 MG/5ML IV SOLN: 2.5000 mg | Freq: Once | INTRAVENOUS | Status: DC

## 2017-02-01 MED ORDER — NYSTATIN 100000 UNIT/ML MT SUSP
5.0000 mL | Freq: Four times a day (QID) | OROMUCOSAL | Status: DC
  Administered 2017-02-01 – 2017-02-05 (×14): 500000 [IU] via ORAL
  Filled 2017-02-01 (×14): qty 5

## 2017-02-01 MED ORDER — METOPROLOL TARTRATE 5 MG/5ML IV SOLN
5.0000 mg | INTRAVENOUS | Status: DC | PRN
  Administered 2017-02-01 – 2017-02-03 (×2): 5 mg via INTRAVENOUS
  Filled 2017-02-01 (×2): qty 5

## 2017-02-01 MED ORDER — GLUCERNA SHAKE PO LIQD
237.0000 mL | Freq: Three times a day (TID) | ORAL | Status: DC
  Administered 2017-02-01 – 2017-02-08 (×12): 237 mL via ORAL

## 2017-02-01 MED ORDER — SODIUM CHLORIDE 0.9% FLUSH
3.0000 mL | Freq: Two times a day (BID) | INTRAVENOUS | Status: DC
  Administered 2017-02-02 – 2017-02-08 (×12): 3 mL via INTRAVENOUS

## 2017-02-01 MED ORDER — SODIUM CHLORIDE 0.9 % IV BOLUS (SEPSIS)
500.0000 mL | Freq: Once | INTRAVENOUS | Status: AC
  Administered 2017-02-01: 500 mL via INTRAVENOUS

## 2017-02-01 MED ORDER — MORPHINE SULFATE (PF) 2 MG/ML IV SOLN: 1.0000 mg | INTRAVENOUS | Status: DC | PRN

## 2017-02-01 MED ORDER — DIPHENOXYLATE-ATROPINE 2.5-0.025 MG PO TABS
1.0000 | ORAL_TABLET | Freq: Three times a day (TID) | ORAL | Status: DC | PRN
  Administered 2017-02-02: 1 via ORAL
  Filled 2017-02-01 (×2): qty 1

## 2017-02-01 MED ORDER — METOPROLOL TARTRATE 5 MG/5ML IV SOLN
5.0000 mg | Freq: Once | INTRAVENOUS | Status: DC
  Filled 2017-02-01: qty 5

## 2017-02-01 MED ORDER — SODIUM CHLORIDE 0.9 % IV SOLN: INTRAVENOUS | Status: AC

## 2017-02-01 MED ORDER — METOPROLOL TARTRATE 5 MG/5ML IV SOLN
5.0000 mg | INTRAVENOUS | Status: DC
  Administered 2017-02-01: 5 mg via INTRAVENOUS

## 2017-02-01 NOTE — Progress Notes (Signed)
[facility] [place] NAME: Miguel [name]    MR#:  [id number]  DATE OF BIRTH:  Nov 29, 1929  SUBJECTIVE:   Weakness and poor appetite REVIEW OF SYSTEMS:   Review of Systems  Unable to perform ROS: Mental acuity   Tolerating Diet: Tolerating PT:   DRUG ALLERGIES:  No Known Allergies  VITALS:  Blood pressure (!) 106/53, pulse (!) 103, temperature 98.6 F (37 C), resp. rate 19, height 6\' 1"  (1.854 m), weight 106.3 kg (234 lb 6.4 oz), SpO2 94 %.  PHYSICAL EXAMINATION:   Physical Exam  GENERAL:  83 y.o.-year-old patient lying in the bed with no acute distress. Weak, ill appearing EYES: Pupils equal, round, reactive to light and accommodation. No scleral icterus. Extraocular muscles intact.  HEENT: Head atraumatic, normocephalic.thrush NECK:  Supple, no jugular venous distention. No thyroid enlargement, no tenderness.  LUNGS: Normal breath sounds bilaterally, no wheezing, rales, rhonchi. No use of accessory muscles of respiration.  CARDIOVASCULAR: S1, S2 normal. No murmurs, rubs, or gallops.  ABDOMEN: Soft, nontender, nondistended. Bowel sounds present. No organomegaly or mass.  EXTREMITIES: No cyanosis, clubbing or edema b/l.    NEUROLOGIC: Cranial nerves II through XII are intact. No focal Motor or sensory deficits b/l.   PSYCHIATRIC:  patient is alert and oriented x 3.  SKIN: No obvious rash, lesion, or ulcer.   LABORATORY PANEL:  CBC  Recent Labs Lab 02/01/17 0457  WBC 0.9*  HGB 10.4*  HCT 30.9*  PLT 151    Chemistries   Recent Labs Lab 01/31/17 1730 02/01/17 0457  NA 137 136  K 4.1 3.7  CL 104 108  CO2 24 19*  GLUCOSE 141* 125*  BUN 25* 26*  CREATININE 1.42* 1.61*  CALCIUM 8.0* 7.0*  AST 31  --   ALT 23  --   ALKPHOS 93  --   BILITOT 1.5*  --    Cardiac Enzymes  Recent Labs Lab 01/31/17 1730  TROPONINI 0.03*   RADIOLOGY:  Ct Head Wo Contrast  Result Date: 01/31/2017 CLINICAL DATA:  Altered  level of consciousness. 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 Patient is on chemotherapy and also has dementia. Dyspnea, tachycardia and diarrhea times 4-5 days. EXAM: CT HEAD WITHOUT CONTRAST TECHNIQUE: Contiguous axial images were obtained from the base of the skull through the vertex without intravenous contrast. COMPARISON:  None. FINDINGS: Brain: No evidence of acute infarction, hemorrhage, hydrocephalus, extra-axial collection or mass lesion/mass effect. Generalized superficial and central atrophy with mild to moderate small vessel ischemic disease of periventricular white matter. No acute intracranial hemorrhage, midline shift or edema. Basal ganglial calcifications are noted bilaterally. No intra-axial mass nor extra-axial fluid collections. The basal cisterns and fourth ventricle are midline without effacement. Mild cerebellar atrophy is noted as well. Vascular: No hyperdense vessels. ) Sclerosis the carotid siphons. Skull: No fracture or suspicious osseous lesions. Sinuses/Orbits: Status post bilateral cataract extractions. Intact globes. No retrobulbar abnormality. Clear paranasal sinuses. Other: Clear mastoids. IMPRESSION: Atrophy with chronic appearing small vessel ischemic disease. No acute intracranial abnormality noted. Electronically Signed   By: Ashley Royalty M.D.   On: 01/31/2017 19:30   Dg Chest Port 1 View  Result Date: 01/31/2017 CLINICAL DATA:  Weakness and shortness of breath for 5 days. Ex-smoker. Diabetes. EXAM: PORTABLE CHEST 1 VIEW COMPARISON:  PET of 12/08/2016.  Chest radiograph of 07/18/2016 FINDINGS: Two frontal radiographs. A right Port-A-Cath terminates at the  low SVC. Remote right rib trauma. Midline trachea. Mild cardiomegaly with transverse aortic atherosclerosis. Possible residual small left pleural effusion. No pneumothorax. Mild hyperinflation. No lobar consolidation. Mild left base scarring.  IMPRESSION: No acute cardiopulmonary disease. Cardiomegaly.  Aortic Atherosclerosis (ICD10-I70.0). Possible small left pleural effusion. Electronically Signed   By: Abigail Miyamoto M.D.   On: 01/31/2017 17:47   ASSESSMENT AND PLAN:   Miguel [name]  is a 83 y.o. male who presents with weakness, mildly increased confusion, A. fib with RVR. Patient's history is given mostly by his wife is a patient is unable to contribute very clearly to his own history of present illness. Wife states that he got chemotherapy earlier this week. She states he typically will get somewhat weak after chemotherapy, but this time he has gotten much weaker than normal. He also is more confused than normal  *General weakness - likely due to deconditioning, his chemotherapy, profound dehydration, poor by mouth intake. We will hydrate him tonight, and treat other conditions as below  *  Atrial fibrillation with RVR ([place]) - IV Lopressor has improved his rate somewhat, we will continue this tonight and continue his home meds for rate control as well. -HR better  * Squamous cell esophageal cancer [facility]) - oncology consult appreciated -pt is neutropenic due to recent chemo -no fever -cont to monitor  *  DM type 2 with diabetic peripheral neuropathy (HCC) - siding scale insulin with corresponding glucose checks  *  COPD (chronic obstructive pulmonary disease) ([place]) - continue home meds  *  GERD without esophagitis - home dose PPI  * Oral thrush nystatin S and S  *  CKD (chronic kidney disease), stage III ([place]) - avoid nephrotoxins, monitor  Case discussed with Care Management/Social Worker. Management plans discussed with the patient, family and they are in agreement.  CODE STATUS: full  DVT Prophylaxis: scd  TOTAL TIME TAKING CARE OF THIS PATIENT: *30* minutes.  >50% time spent on counselling and coordination of care  POSSIBLE D/C IN **30 DAYS, DEPENDING ON CLINICAL CONDITION.  Note: This dictation was prepared with  Dragon dictation along with smaller phrase technology. Any transcriptional errors that result from this process are unintentional.  Carole Doner M.D on 02/01/2017 at 4:52 PM  Between 7am to 6pm - Pager - [phone]  After 6pm go to [email or web address] - password [email or web address] [facility]  Office  [phone]  CC: Primary care physician; Leone Haven, MD

## 2017-02-01 NOTE — Progress Notes (Signed)
Patients family request something for diarrhea. MD notified. Orders received.  RN will continue to assess.

## 2017-02-01 NOTE — Consult Note (Signed)
Hematology/Oncology Consult note [facility] Telephone:([phone] [phone]  Patient Care Team: Leone Haven, MD as PCP - General (Family Medicine)   Name of the patient: Miguel [name]  [id number]  12/14/1929    Reason for consult: h/o metastatic esophageal cancer   Requesting physician: Dr. 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 He sees Dr. Grayland Ormond and is being treated with single agent taxol. Last dose was on 01/27/17. He has baseline SOB and fatigue due to chemotherapy and also has baseline leukopenia/neutropenia since the start of taxol chemotherapy in September 2018  He has been feeling progressively weaker since his chemotherapy on 01/26/17. He has not had much to eat since last 1-2 days. Had 1-2 episodes of loose stools today. No fever at home. He had 1 reading of 38.2 while in the hospital but none since then. Blood cultures, CXR and UA negative  ECOG PS- 2-3  Pain scale- 0  Review of systems- Review of Systems  Constitutional: Positive for malaise/fatigue. Negative for chills, fever and weight loss.  HENT: Negative for congestion, ear discharge and nosebleeds.   Eyes: Negative for blurred vision.  Respiratory: Negative for cough, hemoptysis, sputum production, shortness of breath and wheezing.   Cardiovascular: Negative for chest pain, palpitations, orthopnea and claudication.  Gastrointestinal: Positive for diarrhea. Negative for abdominal pain, blood in stool, constipation, heartburn, melena, nausea and vomiting.  Genitourinary: Negative for dysuria, flank pain, frequency, hematuria and urgency.  Musculoskeletal: Negative for back pain, joint pain and myalgias.  Skin: Negative for rash.  Neurological: Positive for weakness. Negative for dizziness, tingling, focal weakness,  seizures and headaches.  Endo/Heme/Allergies: Does not bruise/bleed easily.  Psychiatric/Behavioral: Negative for depression and suicidal ideas. The patient does not have insomnia.     No Known Allergies  Patient Active Problem List   Diagnosis Date Noted  . Pressure injury of skin 02/01/2017  . Dehydration 01/31/2017  . Atrial fibrillation with RVR ([place]) 01/31/2017  . Goals of care, counseling/discussion 12/26/2016  . Mobility impaired 11/26/2016  . Chronic pain of right knee 10/28/2016  . (HFpEF) heart failure with preserved ejection fraction ([place]) 07/03/2016  . Atrial fibrillation ([place]) 06/06/2016  . CKD (chronic kidney disease), stage III ([place]) 06/06/2016  . General weakness 06/06/2016  . Right renal mass 05/12/2016  . DM type 2 with diabetic peripheral neuropathy ([place]) 03/18/2016  . COPD (chronic obstructive pulmonary disease) ([place]) 03/18/2016  . Anxiety 03/18/2016  . Dementia 03/18/2016  . GERD without esophagitis 03/18/2016  . Macular degeneration 03/18/2016  . 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 OSA (obstructive sleep apnea) 01/22/2015     Past Medical History:  Diagnosis Date  . Alcoholism ([place])   . Anxiety   . COPD (chronic obstructive pulmonary disease) ([place])   . Dementia 03/18/2016  . DM type 2 with diabetic peripheral neuropathy ([place]) 03/18/2016  . Dyspnea    slight   . Dysrhythmia    atrial fib   . Esophageal cancer ([place])   . Esophageal stricture   . GERD (gastroesophageal reflux disease) 03/18/2016  . Hyperlipidemia   . Irregular heart rhythm   . Kidney carcinoma ([place])    right, sp ablation  . Macular degeneration   . Neuropathy   . Renal mass      Past Surgical History:  Procedure  Laterality Date  . BACK SURGERY    . CATARACT EXTRACTION    . CYSTOSCOPY W/ RETROGRADES Right 07/21/2016   Procedure: CYSTOSCOPY WITH RETROGRADE PYELOGRAM;  Surgeon: Hollice Espy, MD;  Location: [facility] ORS;  Service: Urology;  Laterality: Right;  .  CYSTOSCOPY WITH STENT PLACEMENT Right 07/21/2016   Procedure: CYSTOSCOPY WITH STENT PLACEMENT;  Surgeon: Hollice Espy, MD;  Location: [facility] ORS;  Service: Urology;  Laterality: Right;  . IR RADIOLOGIST EVAL & MGMT  09/09/2016  . IR RADIOLOGIST EVAL & MGMT  06/19/2016  . IR RADIOLOGIST EVAL & MGMT  11/12/2016  . KNEE SURGERY    . RADIOLOGY WITH ANESTHESIA N/A 07/23/2016   Procedure: RENAL CRYOABLATION;  Surgeon: Aletta Edouard, MD;  Location: [facility] ORS;  Service: Radiology;  Laterality: N/A;    Social History   Social History  . Marital status: Married    Spouse name: N/A  . Number of children: N/A  . Years of education: N/A   Occupational History  . Not on file.   Social History Main Topics  . Smoking status: Former Smoker    Types: Cigarettes    Quit date: 57  . Smokeless tobacco: Never Used  . Alcohol use No     Comment: quit 2012   . Drug use: No  . Sexual activity: Not Currently    Partners: Female   Other Topics Concern  . Not on file   Social History Narrative  . No narrative on file     Family History  Problem Relation Age of Onset  . Ovarian cancer Sister   . Throat cancer Brother   . Diabetes Mother   . Kidney disease Father   . Prostate cancer Neg Hx   . Kidney cancer Neg Hx      Current Facility-Administered Medications:  .  0.9 %  sodium chloride infusion, , Intravenous, Continuous, Fritzi Mandes, MD .  acetaminophen (TYLENOL) tablet 650 mg, 650 mg, Oral, Q6H PRN, 650 mg at 02/01/17 0419 **OR** acetaminophen (TYLENOL) suppository 650 mg, 650 mg, Rectal, Q6H PRN, Lance Coon, MD .  ALPRAZolam Duanne Moron) tablet 0.5 mg, 0.5 mg, Oral, TID PRN, Lance Coon, MD .  aspirin chewable tablet 81 mg, 81 mg, Oral, Daily, Lance Coon, MD, 81 mg at 02/01/17 1046 .  atorvastatin (LIPITOR) tablet 40 mg, 40 mg, Oral, Daily, Lance Coon, MD, 40 mg at 02/01/17 1045 .  donepezil (ARICEPT) tablet 10 mg, 10 mg, Oral, Corwin Levins, MD, 10 mg at 01/31/17 2341 .   enoxaparin (LOVENOX) injection 40 mg, 40 mg, Subcutaneous, Q24H, Lance Coon, MD, 40 mg at 01/31/17 2342 .  feeding supplement (GLUCERNA SHAKE) (GLUCERNA SHAKE) liquid 237 mL, 237 mL, Oral, TID BM, Fritzi Mandes, MD .  insulin aspart (novoLOG) injection 0-5 Units, 0-5 Units, Subcutaneous, QHS, Lance Coon, MD .  insulin aspart (novoLOG) injection 0-9 Units, 0-9 Units, Subcutaneous, TID WC, Lance Coon, MD .  megestrol (MEGACE) tablet 40 mg, 40 mg, Oral, Daily, Lance Coon, MD, 40 mg at 02/01/17 1045 .  metoprolol tartrate (LOPRESSOR) injection 5 mg, 5 mg, Intravenous, Once, Lance Coon, MD .  metoprolol tartrate (LOPRESSOR) injection 5 mg, 5 mg, Intravenous, Q2H PRN, Harrie Foreman, MD, 5 mg at 02/01/17 0604 .  metoprolol tartrate (LOPRESSOR) tablet 12.5 mg, 12.5 mg, Oral, BID, Lance Coon, MD, 12.5 mg at 02/01/17 1045 .  mometasone-formoterol (DULERA) 200-5 MCG/ACT inhaler 2 puff, 2 puff, Inhalation, BID, Lance Coon, MD, 2 puff at 02/01/17 [phone] .  morphine 2 MG/ML  injection 1-2 mg, 1-2 mg, Intravenous, Q4H PRN, Harrie Foreman, MD .  nystatin (MYCOSTATIN) 100000 UNIT/ML suspension 500,000 Units, 5 mL, Oral, QID, Fritzi Mandes, MD .  ondansetron [facility]) tablet 4 mg, 4 mg, Oral, Q6H PRN **OR** ondansetron (ZOFRAN) injection 4 mg, 4 mg, Intravenous, Q6H PRN, Lance Coon, MD .  pantoprazole (PROTONIX) EC tablet 40 mg, 40 mg, Oral, BID, Lance Coon, MD, 40 mg at 02/01/17 1046 .  QUEtiapine (SEROQUEL) tablet 50 mg, 50 mg, Oral, Corwin Levins, MD, 50 mg at 01/31/17 2342 .  tamsulosin (FLOMAX) capsule 0.4 mg, 0.4 mg, Oral, Daily, Lance Coon, MD, 0.4 mg at 02/01/17 1045   Physical exam:  Vitals:   01/31/17 2255 02/01/17 0351 02/01/17 0555 02/01/17 0900  BP: 122/62 (!) 105/53 (!) 102/50 118/65  Pulse: 97 (!) 142 (!) 120 (!) 114  Resp: 18 18  20   Temp: 100 F (37.8 C) (!) 100.7 F (38.2 C) 97.7 F (36.5 C) 97.8 F (36.6 C)  TempSrc: Oral Oral Oral Oral  SpO2: 95% 95%   95%  Weight: 233 lb 3.2 oz (105.8 kg) 234 lb 6.4 oz (106.3 kg)    Height:       Physical Exam  Constitutional: He is oriented to person, place, and time.  Appears fatigued.  HENT:  Head: Normocephalic and atraumatic.  Thrush +  Eyes: Pupils are equal, round, and reactive to light. EOM are normal.  Neck: Normal range of motion.  Cardiovascular: Normal rate and normal heart sounds.   HR irregular  Pulmonary/Chest: Effort normal and breath sounds normal.  Abdominal: Soft. Bowel sounds are normal.  Neurological: He is alert and oriented to person, place, and time.  Skin: Skin is warm and dry.       CMP Latest Ref Rng & Units 02/01/2017  Glucose 65 - 99 mg/dL 125(H)  BUN 6 - 20 mg/dL 26(H)  Creatinine 0.61 - 1.24 mg/dL 1.61(H)  Sodium 135 - 145 mmol/L 136  Potassium 3.5 - 5.1 mmol/L 3.7  Chloride 101 - 111 mmol/L 108  CO2 22 - 32 mmol/L 19(L)  Calcium 8.9 - 10.3 mg/dL 7.0(L)  Total Protein 6.5 - 8.1 g/dL -  Total Bilirubin 0.3 - 1.2 mg/dL -  Alkaline Phos 38 - 126 U/L -  AST 15 - 41 U/L -  ALT 17 - 63 U/L -   CBC Latest Ref Rng & Units 02/01/2017  WBC 3.8 - 10.6 K/uL 0.9(LL)  Hemoglobin 13.0 - 18.0 g/dL 10.4(L)  Hematocrit 40.0 - 52.0 % 30.9(L)  Platelets 150 - 440 K/uL 151    @IMAGES @  Ct Head Wo Contrast  Result Date: 01/31/2017 CLINICAL DATA:  Altered level of consciousness. 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 Patient is on chemotherapy and also has dementia. Dyspnea, tachycardia and diarrhea times 4-5 days. EXAM: CT HEAD WITHOUT CONTRAST TECHNIQUE: Contiguous axial images were obtained from the base of the skull through the vertex without intravenous contrast. COMPARISON:  None. FINDINGS: Brain: No evidence of acute infarction, hemorrhage, hydrocephalus, extra-axial collection or mass lesion/mass effect. Generalized superficial and central atrophy with mild to moderate small vessel ischemic  disease of periventricular white matter. No acute intracranial hemorrhage, midline shift or edema. Basal ganglial calcifications are noted bilaterally. No intra-axial mass nor extra-axial fluid collections. The basal cisterns and fourth ventricle are midline without effacement. Mild cerebellar atrophy is noted as well. Vascular: No hyperdense vessels. ) Sclerosis the carotid siphons. Skull: No  fracture or suspicious osseous lesions. Sinuses/Orbits: Status post bilateral cataract extractions. Intact globes. No retrobulbar abnormality. Clear paranasal sinuses. Other: Clear mastoids. IMPRESSION: Atrophy with chronic appearing small vessel ischemic disease. No acute intracranial abnormality noted. Electronically Signed   By: Ashley Royalty M.D.   On: 01/31/2017 19:30   Dg Chest Port 1 View  Result Date: 01/31/2017 CLINICAL DATA:  Weakness and shortness of breath for 5 days. Ex-smoker. Diabetes. EXAM: PORTABLE CHEST 1 VIEW COMPARISON:  PET of 12/08/2016.  Chest radiograph of 07/18/2016 FINDINGS: Two frontal radiographs. A right Port-A-Cath terminates at the low SVC. Remote right rib trauma. Midline trachea. Mild cardiomegaly with transverse aortic atherosclerosis. Possible residual small left pleural effusion. No pneumothorax. Mild hyperinflation. No lobar consolidation. Mild left base scarring. IMPRESSION: No acute cardiopulmonary disease. Cardiomegaly.  Aortic Atherosclerosis (ICD10-I70.0). Possible small left pleural effusion. Electronically Signed   By: Abigail Miyamoto M.D.   On: 01/31/2017 17:47    Assessment and plan- Patient is a 83 y.o. male with Stage IV esophageal cancer with liver and bone mets currently on single agent taxol  Neutropenia due to chemotherapy: He is afebrile. He has 1 reading of 38.2 which did not persist. Infectious work up negative thus far. Wbc 0.9 today 0.6 yesterday.  I will therefore hold off on neupogen at this time. I will speak to Dr. Grayland Ormond in the morning if he would like to  give neupogen in AM  Generalized weakness/ dehydration- due to Stage IV cancer and dehydration. contionue IVF  Oral thrush- will start nystatin swish and spit  CKD- stable at baseline  HR in 120's yesterday and is still around that range   Thank you for the opportunity to participate in the care of this patient. Dr. Grayland Ormond will see him tomorrow   Visit Diagnosis 1. Generalized weakness   2. Chemotherapy-induced neutropenia (HCC)   3. Atrial fibrillation with rapid ventricular response ([place])   4. Confusion with non-focal neuro exam     Dr. Randa Evens, MD, MPH [facility] at [facility] Pager- [phone] 02/01/2017  2:42 PM

## 2017-02-01 NOTE — Progress Notes (Signed)
Patient is admitted to room 251 with he diagnosis of generalized weakness. Alert and oriented to self. Tele box called to CCMD by Doretha Imus  RN and Estill Bamberg NT. Skin assessment done with Levester Fresh RN, see the findings  in assessment flow sheet.   Bed alarm activated and the bed is in the lowest position. Wife is at bedside voice no concerns. Will continue to monitor.

## 2017-02-01 NOTE — Progress Notes (Signed)
Patient HR hanging in the 130 s , Dr. Marcille Blanco notified. New orders  received and implemented. Will continue to monitor.

## 2017-02-02 DIAGNOSIS — F1020 Alcohol dependence, uncomplicated: Secondary | ICD-10-CM

## 2017-02-02 DIAGNOSIS — R413 Other amnesia: Secondary | ICD-10-CM

## 2017-02-02 DIAGNOSIS — R630 Anorexia: Secondary | ICD-10-CM

## 2017-02-02 DIAGNOSIS — E785 Hyperlipidemia, unspecified: Secondary | ICD-10-CM

## 2017-02-02 DIAGNOSIS — E119 Type 2 diabetes mellitus without complications: Secondary | ICD-10-CM

## 2017-02-02 DIAGNOSIS — Z794 Long term (current) use of insulin: Secondary | ICD-10-CM

## 2017-02-02 LAB — GASTROINTESTINAL PANEL BY PCR, STOOL (REPLACES STOOL CULTURE)
Adenovirus F40/41: NOT DETECTED
Astrovirus: NOT DETECTED
CAMPYLOBACTER SPECIES: NOT DETECTED
CRYPTOSPORIDIUM: NOT DETECTED
CYCLOSPORA CAYETANENSIS: NOT DETECTED
Entamoeba histolytica: NOT DETECTED
Enteroaggregative E coli (EAEC): NOT DETECTED
Enteropathogenic E coli (EPEC): NOT DETECTED
Enterotoxigenic E coli (ETEC): NOT DETECTED
Giardia lamblia: NOT DETECTED
Norovirus GI/GII: NOT DETECTED
PLESIMONAS SHIGELLOIDES: NOT DETECTED
ROTAVIRUS A: NOT DETECTED
SALMONELLA SPECIES: NOT DETECTED
SHIGELLA/ENTEROINVASIVE E COLI (EIEC): NOT DETECTED
Sapovirus (I, II, IV, and V): NOT DETECTED
Shiga like toxin producing E coli (STEC): NOT DETECTED
VIBRIO SPECIES: NOT DETECTED
Vibrio cholerae: NOT DETECTED
YERSINIA ENTEROCOLITICA: NOT DETECTED

## 2017-02-02 LAB — GLUCOSE, CAPILLARY
GLUCOSE-CAPILLARY: 142 mg/dL — AB (ref 65–99)
Glucose-Capillary: 153 mg/dL — ABNORMAL HIGH (ref 65–99)
Glucose-Capillary: 136 mg/dL — ABNORMAL HIGH (ref 65–99)
Glucose-Capillary: 125 mg/dL — ABNORMAL HIGH (ref 65–99)

## 2017-02-02 LAB — CBC
HEMATOCRIT: 29.4 % — AB (ref 40.0–52.0)
HEMOGLOBIN: 9.8 g/dL — AB (ref 13.0–18.0)
MCH: 31.7 pg (ref 26.0–34.0)
MCHC: 33.4 g/dL (ref 32.0–36.0)
MCV: 94.9 fL (ref 80.0–100.0)
Platelets: 130 10*3/uL — ABNORMAL LOW (ref 150–440)
RBC: 3.1 MIL/uL — AB (ref 4.40–5.90)
RDW: 17.4 % — ABNORMAL HIGH (ref 11.5–14.5)
WBC: 1.4 10*3/uL — CL (ref 3.8–10.6)

## 2017-02-02 LAB — BASIC METABOLIC PANEL
ANION GAP: 4 — AB (ref 5–15)
BUN: 35 mg/dL — AB (ref 6–20)
CALCIUM: 6.3 mg/dL — AB (ref 8.9–10.3)
CO2: 21 mmol/L — AB (ref 22–32)
Chloride: 113 mmol/L — ABNORMAL HIGH (ref 101–111)
Creatinine, Ser: 1.64 mg/dL — ABNORMAL HIGH (ref 0.61–1.24)
GFR calc Af Amer: 42 mL/min — ABNORMAL LOW (ref >60)
GFR calc non Af Amer: 37 mL/min — ABNORMAL LOW (ref >60)
GLUCOSE: 153 mg/dL — AB (ref 65–99)
POTASSIUM: 3.4 mmol/L — AB (ref 3.5–5.1)
Sodium: 138 mmol/L (ref 135–145)

## 2017-02-02 LAB — ALBUMIN: Albumin: 2.3 g/dL — ABNORMAL LOW (ref 3.5–5.0)

## 2017-02-02 LAB — C DIFFICILE QUICK SCREEN W PCR REFLEX
C DIFFICILE (CDIFF) INTERP: NOT DETECTED
C Diff antigen: NEGATIVE
C Diff toxin: NEGATIVE

## 2017-02-02 LAB — URINE CULTURE
Culture: NO GROWTH
Special Requests: NORMAL

## 2017-02-02 MED ORDER — TBO-FILGRASTIM 300 MCG/0.5ML ~~LOC~~ SOSY
300.0000 ug | PREFILLED_SYRINGE | Freq: Once | SUBCUTANEOUS | Status: AC
  Administered 2017-02-02: 300 ug via SUBCUTANEOUS
  Filled 2017-02-02: qty 0.5

## 2017-02-02 MED ORDER — POTASSIUM CHLORIDE CRYS ER 20 MEQ PO TBCR
40.0000 meq | EXTENDED_RELEASE_TABLET | Freq: Once | ORAL | Status: AC
  Administered 2017-02-02: 40 meq via ORAL
  Filled 2017-02-02: qty 2

## 2017-02-02 MED ORDER — METOPROLOL TARTRATE 25 MG PO TABS
25.0000 mg | ORAL_TABLET | Freq: Two times a day (BID) | ORAL | Status: DC
  Administered 2017-02-02 – 2017-02-03 (×2): 25 mg via ORAL
  Filled 2017-02-02 (×2): qty 1

## 2017-02-02 MED ORDER — METOPROLOL TARTRATE 25 MG PO TABS
12.5000 mg | ORAL_TABLET | Freq: Once | ORAL | Status: AC
  Administered 2017-02-02: 12.5 mg via ORAL
  Filled 2017-02-02: qty 1

## 2017-02-02 NOTE — Progress Notes (Signed)
[place] at [place] NAME: Miguel [name]    MR#:  [id number]  DATE OF BIRTH:  17-Nov-1929  SUBJECTIVE:   Weakness and poor appetite REVIEW OF SYSTEMS:   Review of Systems  Constitutional: Negative for chills, fever and weight loss.  HENT: Positive for sore throat. Negative for ear discharge, ear pain and nosebleeds.   Eyes: Negative for blurred vision, pain and discharge.  Respiratory: Negative for sputum production, shortness of breath, wheezing and stridor.   Cardiovascular: Negative for chest pain, palpitations, orthopnea and PND.  Gastrointestinal: Positive for diarrhea and nausea. Negative for abdominal pain and vomiting.  Genitourinary: Negative for frequency and urgency.  Musculoskeletal: Negative for back pain and joint pain.  Neurological: Positive for weakness. Negative for sensory change, speech change and focal weakness.  Psychiatric/Behavioral: Negative for depression and hallucinations. The patient is not nervous/anxious.    Tolerating Diet:very little Tolerating PT: pending  DRUG ALLERGIES:  No Known Allergies  VITALS:  Blood pressure (!) 108/58, pulse (!) 110, temperature 97.8 F (36.6 C), temperature source Oral, resp. rate 18, height 6\' 1"  (1.854 m), weight 106.3 kg (234 lb 6.4 oz), SpO2 96 %.  PHYSICAL EXAMINATION:   Physical Exam  GENERAL:  83 y.o.-year-old patient lying in the bed with no acute distress. Weak, ill appearing EYES: Pupils equal, round, reactive to light and accommodation. No scleral icterus. Extraocular muscles intact.  HEENT: Head atraumatic, normocephalic, oral thrush NECK:  Supple, no jugular venous distention. No thyroid enlargement, no tenderness.  LUNGS: Normal breath sounds bilaterally, no wheezing, rales, rhonchi. No use of accessory muscles of respiration.  CARDIOVASCULAR: S1, S2 normal. No murmurs, rubs, or gallops.  ABDOMEN: Soft, nontender, nondistended. Bowel sounds present. No  organomegaly or mass.  EXTREMITIES: No cyanosis, clubbing or edema b/l.    NEUROLOGIC: Cranial nerves II through XII are intact. No focal Motor or sensory deficits b/l.   PSYCHIATRIC:  patient is alert and oriented x 3.  SKIN: No obvious rash, lesion, or ulcer.   LABORATORY PANEL:  CBC  Recent Labs Lab 02/02/17 0456  WBC 1.4*  HGB 9.8*  HCT 29.4*  PLT 130*    Chemistries   Recent Labs Lab 01/31/17 1730  02/02/17 0451  NA 137  < > 138  K 4.1  < > 3.4*  CL 104  < > 113*  CO2 24  < > 21*  GLUCOSE 141*  < > 153*  BUN 25*  < > 35*  CREATININE 1.42*  < > 1.64*  CALCIUM 8.0*  < > 6.3*  AST 31  --   --   ALT 23  --   --   ALKPHOS 93  --   --   BILITOT 1.5*  --   --   < > = values in this interval not displayed. Cardiac Enzymes  Recent Labs Lab 01/31/17 1730  TROPONINI 0.03*   RADIOLOGY:  Ct Head Wo Contrast  Result Date: 01/31/2017 CLINICAL DATA:  Altered level of consciousness. 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 Patient is on chemotherapy and also has dementia. Dyspnea, tachycardia and diarrhea times 4-5 days. EXAM: CT HEAD WITHOUT CONTRAST TECHNIQUE: Contiguous axial images were obtained from the base of the skull through the vertex without intravenous contrast. COMPARISON:  None. FINDINGS: Brain: No evidence of acute infarction, hemorrhage, hydrocephalus, extra-axial collection or mass lesion/mass effect. Generalized superficial and central atrophy with mild to moderate  small vessel ischemic disease of periventricular white matter. No acute intracranial hemorrhage, midline shift or edema. Basal ganglial calcifications are noted bilaterally. No intra-axial mass nor extra-axial fluid collections. The basal cisterns and fourth ventricle are midline without effacement. Mild cerebellar atrophy is noted as well. Vascular: No hyperdense vessels. ) Sclerosis the carotid siphons. Skull: No fracture or  suspicious osseous lesions. Sinuses/Orbits: Status post bilateral cataract extractions. Intact globes. No retrobulbar abnormality. Clear paranasal sinuses. Other: Clear mastoids. IMPRESSION: Atrophy with chronic appearing small vessel ischemic disease. No acute intracranial abnormality noted. Electronically Signed   By: Ashley Royalty M.D.   On: 01/31/2017 19:30   Dg Chest Port 1 View  Result Date: 01/31/2017 CLINICAL DATA:  Weakness and shortness of breath for 5 days. Ex-smoker. Diabetes. EXAM: PORTABLE CHEST 1 VIEW COMPARISON:  PET of 12/08/2016.  Chest radiograph of 07/18/2016 FINDINGS: Two frontal radiographs. A right Port-A-Cath terminates at the low SVC. Remote right rib trauma. Midline trachea. Mild cardiomegaly with transverse aortic atherosclerosis. Possible residual small left pleural effusion. No pneumothorax. Mild hyperinflation. No lobar consolidation. Mild left base scarring. IMPRESSION: No acute cardiopulmonary disease. Cardiomegaly.  Aortic Atherosclerosis (ICD10-I70.0). Possible small left pleural effusion. Electronically Signed   By: Abigail Miyamoto M.D.   On: 01/31/2017 17:47   ASSESSMENT AND PLAN:   Miguel [name]  is a 83 y.o. male who presents with weakness, mildly increased confusion, A. fib with RVR. Patient's history is given mostly by his wife is a patient is unable to contribute very clearly to his own history of present illness. Wife states that he got chemotherapy earlier this week. She states he typically will get somewhat weak after chemotherapy, but this time he has gotten much weaker than normal. He also is more confused than normal  *General weakness - likely due to deconditioning, his chemotherapy, profound dehydration, poor by mouth intake. Cont IVF Encourage oral glucerna/ensure  *  Atrial fibrillation with RVR (HCC)  -metoprolol 25 mg bid -HR better -Echo 05/2016 showed EF 55%  *Neutropenia due to chemo for Stage IV Squamous cell esophageal cancer [facility])  - oncology  consult appreciated -pt is neutropenic due to recent chemo -no fever -start Neupogen 300 mcg qd  *Diarrhea -C diff and GI PCR negative -?chemo related  *  DM type 2 with diabetic peripheral neuropathy (HCC) - siding scale insulin with corresponding glucose checks  *  COPD (chronic obstructive pulmonary disease) ([place]) - continue home meds  *  GERD without esophagitis - home dose PPI  * Oral thrush nystatin S and S  *  CKD (chronic kidney disease), stage III ([place]) - avoid nephrotoxins  PT to see pt CM/CSW for d/c planning Spoke with wife and dr Grayland Ormond  Case discussed with Care Management/Social Worker. Management plans discussed with the patient, family and they are in agreement.  CODE STATUS: full  DVT Prophylaxis: scd  TOTAL TIME TAKING CARE OF THIS PATIENT: *30* minutes.  >50% time spent on counselling and coordination of care  POSSIBLE D/C IN **30 DAYS, DEPENDING ON CLINICAL CONDITION.  Note: This dictation was prepared with Dragon dictation along with smaller phrase technology. Any transcriptional errors that result from this process are unintentional.  Yacoub Diltz M.D on 02/02/2017 at 1:05 PM  Between 7am to 6pm - Pager - [phone]  After 6pm go to [email or web address] - password [email or web address]  Office  [phone]  CC: Primary care physician; Leone Haven, MD

## 2017-02-02 NOTE — Evaluation (Signed)
Physical Therapy Evaluation Patient Details Name: Miguel [name]. MRN: [id number] DOB: 1930-03-10 Today's Date: 02/02/2017   History of Present Illness  Pt is an 83 y.o. 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 Pt recently received chemotherapy (01/26/17) and usually experiences slight SOB and generalized weakness following treatment. Pt presents to ED 01/31/17 with SOB, diarrhea, confusion and worsening weakness over the past 4-5 days secondary to chemotherapy-induced neutropenia. CT of head confirms atrophy with chronic appearing small vessel ischemic disease; no other acute abnormalties noted. Chest imaging confirms small L pleural effusion.  Clinical Impression  Prior to hospital admission, pt was living at [facility], receiving some assistance from family for ADLs; ambulatory with quad cane for short distances, but mainly gets around using power wheelchair.  Currently pt is modified independent for bed mobility with generalized strength and balance deficits. Unable to assess transfers and gait secondary to pt refusal to go any further than seated at EOB, stating "I get out of bed and into my chair at home". Anticipates pt has ability to move from seated at EOB to recliner, but plan to formally assess in future sessions. Pt would benefit from skilled PT to address noted impairments and functional limitations (see below for any additional details).  Upon hospital discharge, recommend pt discharge to independent living facility with HHPT.     Follow Up Recommendations Home health PT    Equipment Recommendations  Rolling walker with 5" wheels    Recommendations for Other Services       Precautions / Restrictions Precautions Precautions: Fall;Other (comment) Precaution Comments: protective precautions Restrictions Weight Bearing  Restrictions: No      Mobility  Bed Mobility Overal bed mobility: Modified Independent             General bed mobility comments: Mod I with HOB elevated; increased time to perform, otherwise no difficulties noted  Transfers                 General transfer comment: deferred; pt refused to transfer to chair, stated "I get out of bed into my chair at home"  Ambulation/Gait                Stairs            Wheelchair Mobility    Modified Rankin (Stroke Patients Only)       Balance Overall balance assessment: Needs assistance Sitting-balance support: Bilateral upper extremity supported Sitting balance-Leahy Scale: Fair Sitting balance - Comments: able to reach outside of BOS, unilateral LE AROM without LOB       Standing balance comment: deferred secondary to pt refusal to get up                             Pertinent Vitals/Pain Pain Assessment: No/denies pain    Home Living Family/patient expects to be discharged to:: Other (Comment) ([place])               Home Equipment: Environmental consultant - 4 wheels;Cane - quad;Bedside commode;Shower seat;Grab bars - toilet;Grab bars - tub/shower;Wheelchair - power Technical brewer)      Prior Function Level of Independence: Needs assistance         Comments: receives assistance from wife for dressing and some bathing; uses cane for short distances, but mainly mobile with power chair (able to  transfer from bed to chair independently); meals provided by independent living facility; hx of fall within the previous week     Hand Dominance        Extremity/Trunk Assessment   Upper Extremity Assessment Upper Extremity Assessment: Generalized weakness (at least 3/5 bilaterally, not fully assessed secondary to medical condition)    Lower Extremity Assessment Lower Extremity Assessment: Generalized weakness (at least 3/5 bilaterally, not fully assessed seconary to medical  condition)    Cervical / Trunk Assessment Cervical / Trunk Assessment: Normal  Communication   Communication: No difficulties  Cognition Arousal/Alertness: Awake/alert (pt kept closing his eyes during session) Behavior During Therapy: WFL for tasks assessed/performed Overall Cognitive Status: Within Functional Limits for tasks assessed                                        General Comments      Exercises Total Joint Exercises Ankle Circles/Pumps: AROM;Both;10 reps;Seated Long Arc Quad: AROM;Both;10 reps;Seated Other Exercises Other Exercises: seated hip flexion/marches x10 per leg   Assessment/Plan    PT Assessment Patient needs continued PT services  PT Problem List Decreased strength;Decreased range of motion;Decreased activity tolerance;Decreased balance;Decreased mobility       PT Treatment Interventions DME instruction;Gait training;Stair training;Functional mobility training;Therapeutic activities;Therapeutic exercise;Balance training;Patient/family education    PT Goals (Current goals can be found in the Care Plan section)  Acute Rehab PT Goals Patient Stated Goal: to go home PT Goal Formulation: With patient Time For Goal Achievement: 02/16/17 Potential to Achieve Goals: Fair    Frequency Min 2X/week   Barriers to discharge        Co-evaluation               AM-PAC PT "6 Clicks" Daily Activity  Outcome Measure Difficulty turning over in bed (including adjusting bedclothes, sheets and blankets)?: A Little Difficulty moving from lying on back to sitting on the side of the bed? : A Little Difficulty sitting down on and standing up from a chair with arms (e.g., wheelchair, bedside commode, etc,.)?: A Lot Help needed moving to and from a bed to chair (including a wheelchair)?: A Lot Help needed walking in hospital room?: A Lot Help needed climbing 3-5 steps with a railing? : A Lot 6 Click Score: 14    End of Session   Activity  Tolerance: Patient limited by lethargy Patient left: in bed;with call bell/phone within reach;with bed alarm set;with nursing/sitter in room;with family/visitor present (CNA in room) Nurse Communication: Mobility status PT Visit Diagnosis: Unsteadiness on feet (R26.81);Muscle weakness (generalized) (M62.81);History of falling (Z91.81);Other abnormalities of gait and mobility (R26.89)    Time: 3546-5681 PT Time Calculation (min) (ACUTE ONLY): 24 min   Charges:         PT G CodesWetzel Bjornstad, SPT 02/02/2017, 1:14 PM

## 2017-02-02 NOTE — Care Management (Signed)
Patient admitted for weakness and afib.  Patient lives at [place]  With his wife.   PCP Sonn[place].  PT has assessed patient and recommends home health PT.  Patient has been open with [facility] previously and would like to use them again.  Referral made to [facility] with [facility].  Patient has rollator, quad cane, and electric scooter in the home.  RNCM following.

## 2017-02-02 NOTE — Progress Notes (Signed)
Ca level =6.3 this morning. Dr. Marcille Blanco notified without any new order. Patient has not reported any muscle cramp or spasms.  Will continue to monitor.

## 2017-02-02 NOTE — Progress Notes (Addendum)
Initial Nutrition Assessment  DOCUMENTATION CODES:   Obesity unspecified  INTERVENTION:  1. Recommend Ensure Enlive po BID, each supplement provides 350 kcal and 20 grams of protein  NUTRITION DIAGNOSIS:   Increased nutrient needs related to chronic illness as evidenced by estimated needs.  GOAL:   Patient will meet greater than or equal to 90% of their needs  MONITOR:   PO intake, I & O's, Labs, Weight trends, Supplement acceptance  REASON FOR ASSESSMENT:   Malnutrition Screening Tool    ASSESSMENT:   Miguel [name] has a PMH of Stg IV Esophgeal cancer, COPD, GERD, CKD III, Dementia, presents with generalized weakness due to deconditioning, chemotherapy, dehydration.   Spoke with patient, patient's Son at bedside. He reports patient has ahd an on and off appetite for a while now. Patient and his wife are residents at [facility]. All meals are made for them. Son reports good appetite. Patient unable to give history due to dementia. Appetite ok x1 week. Son denies weight loss, but patient exhibits a 13 pound/5.2% severe weight loss within 1 month per chart. Has some diarrhea today. Had a few bites of steak and potatoes, some ensure for lunch. Patient not hungry currently but seems to be meeting needs at [facility]. Weight has been relatively stable for 2 months. Also has thrush currently.  Nutrition-Focused physical exam completed. Findings are no fat depletion, no muscle depletion, and no edema.   Labs reviewed:  CBGs 142-153, K 3.4  Medications reviewed and include:  Novolog 0-9 Units TID, 0-5 Units HS, Megace  Receiving cycle 3 of 6 of taxol currently.  Diet Order:  Diet heart healthy/carb modified Room service appropriate? Yes; Fluid consistency: Thin  Skin:  Wound (see comment) (Back pucture, Stage 1 to bony prominence)  Last BM:  02/02/2017 (Type 7)  Height:   Ht Readings from Last 1 Encounters:  01/31/17 6\' 1"  (1.854 m)    Weight:   Wt Readings from Last  1 Encounters:  02/02/17 234 lb 6.4 oz (106.3 kg)    Ideal Body Weight:  83.63 kg  BMI:  Body mass index is 30.93 kg/m.  Estimated Nutritional Needs:   Kcal:  2200-2600 calories  Protein:  125-150 grams  Fluid:  2.2-2.6L  EDUCATION NEEDS:   No education needs identified at this time  Miguel [name]. Miguel Gervacio, MS, RD LDN Inpatient Clinical Dietitian Pager [phone]

## 2017-02-02 NOTE — Progress Notes (Signed)
Patient is having frequest watery loose stool with foul odor. Dr. Jannifer Franklin notified with a new order to check for C' diff. Will continue to monitor.

## 2017-02-02 NOTE — Consult Note (Signed)
[facility]  [phone] [phone]  ID: Miguel [name]. OB: 03/06/1930  MR#: [id number]  HUD#:[id number]  Patient Care Team: Leone Haven, MD as PCP - General (Family Medicine)  CHIEF COMPLAINT: Stage IV esophageal cancer, progressive weakness and fatigue, neutropenia  INTERVAL HISTORY: Patient feels improved since admission, butstill has difficulty getting up and and is unable to walk independently. He continues to have a poor appetite.  REVIEW OF SYSTEMS:   Review of Systems  Constitutional: Positive for malaise/fatigue. Negative for fever and weight loss.  Respiratory: Negative.  Negative for cough and shortness of breath.   Cardiovascular: Negative.  Negative for chest pain and leg swelling.  Gastrointestinal: Negative.  Negative for abdominal pain and nausea.  Genitourinary: Negative.   Musculoskeletal: Negative.   Skin: Negative.  Negative for rash.  Neurological: Positive for weakness.  Psychiatric/Behavioral: Positive for memory loss. The patient is not nervous/anxious.     As per HPI. Otherwise, a complete review of systems is negative.  PAST MEDICAL HISTORY: Past Medical History:  Diagnosis Date  . Alcoholism ([place])   . Anxiety   . COPD (chronic obstructive pulmonary disease) ([place])   . Dementia 03/18/2016  . DM type 2 with diabetic peripheral neuropathy ([place]) 03/18/2016  . Dyspnea    slight   . Dysrhythmia    atrial fib   . Esophageal cancer ([place])   . Esophageal stricture   . GERD (gastroesophageal reflux disease) 03/18/2016  . Hyperlipidemia   . Irregular heart rhythm   . Kidney carcinoma ([place])    right, sp ablation  . Macular degeneration   . Neuropathy   . Renal mass     PAST SURGICAL HISTORY: Past Surgical History:  Procedure Laterality Date  . BACK SURGERY    . CATARACT EXTRACTION    . CYSTOSCOPY W/ RETROGRADES Right 07/21/2016   Procedure: CYSTOSCOPY WITH RETROGRADE PYELOGRAM;  Surgeon: Hollice Espy, MD;  Location: [facility] ORS;  Service: Urology;  Laterality: Right;  . CYSTOSCOPY WITH STENT PLACEMENT Right 07/21/2016   Procedure: CYSTOSCOPY WITH STENT PLACEMENT;  Surgeon: Hollice Espy, MD;  Location: [facility] ORS;  Service: Urology;  Laterality: Right;  . IR RADIOLOGIST EVAL & MGMT  09/09/2016  . IR RADIOLOGIST EVAL & MGMT  06/19/2016  . IR RADIOLOGIST EVAL & MGMT  11/12/2016  . KNEE SURGERY    . RADIOLOGY WITH ANESTHESIA N/A 07/23/2016   Procedure: RENAL CRYOABLATION;  Surgeon: Aletta Edouard, MD;  Location: [facility] ORS;  Service: Radiology;  Laterality: N/A;    FAMILY HISTORY: Family History  Problem Relation Age of Onset  . Ovarian cancer Sister   . Throat cancer Brother   . Diabetes Mother   . Kidney disease Father   . Prostate cancer Neg Hx   . Kidney cancer Neg Hx     ADVANCED DIRECTIVES (Y/N):  @ADVDIR @  HEALTH MAINTENANCE: Social History  Substance Use Topics  . Smoking status: Former Smoker    Types: Cigarettes    Quit date: 83  . Smokeless tobacco: Never Used  . Alcohol use No     Comment: quit 2012      Colonoscopy:  PAP:  Bone density:  Lipid panel:  No Known Allergies  Current Facility-Administered Medications  Medication Dose Route Frequency Provider Last Rate Last Dose  . acetaminophen (TYLENOL) tablet 650 mg  650 mg Oral Q6H PRN Lance Coon, MD   650 mg at 02/01/17 0419   Or  . acetaminophen (TYLENOL) suppository 650 mg  650 mg Rectal Q6H PRN Lance Coon, MD      . ALPRAZolam Duanne Moron) tablet 0.5 mg  0.5 mg Oral TID PRN Lance Coon, MD      . aspirin chewable tablet 81 mg  81 mg Oral Daily Lance Coon, MD   81 mg at 02/02/17 1039  . atorvastatin (LIPITOR) tablet 40 mg  40 mg Oral Daily Lance Coon, MD   40 mg at 02/02/17 1038  . diphenoxylate-atropine (LOMOTIL) 2.5-0.025 MG per tablet 1 tablet  1 tablet Oral Q8H PRN Fritzi Mandes, MD   1 tablet at 02/02/17 1420  . donepezil (ARICEPT) tablet 10 mg  10 mg Oral Corwin Levins, MD   10 mg at  02/02/17 2138  . enoxaparin (LOVENOX) injection 40 mg  40 mg Subcutaneous Q24H Lance Coon, MD   40 mg at 02/02/17 2145  . feeding supplement (GLUCERNA SHAKE) (GLUCERNA SHAKE) liquid 237 mL  237 mL Oral TID BM Fritzi Mandes, MD   237 mL at 02/02/17 2000  . insulin aspart (novoLOG) injection 0-5 Units  0-5 Units Subcutaneous QHS Lance Coon, MD      . insulin aspart (novoLOG) injection 0-9 Units  0-9 Units Subcutaneous TID WC Lance Coon, MD   1 Units at 02/02/17 1850  . megestrol (MEGACE) tablet 40 mg  40 mg Oral Daily Lance Coon, MD   40 mg at 02/02/17 1039  . metoprolol tartrate (LOPRESSOR) injection 5 mg  5 mg Intravenous Once Lance Coon, MD      . metoprolol tartrate (LOPRESSOR) injection 5 mg  5 mg Intravenous Q2H PRN Harrie Foreman, MD   5 mg at 02/01/17 0604  . metoprolol tartrate (LOPRESSOR) tablet 25 mg  25 mg Oral BID Fritzi Mandes, MD   25 mg at 02/02/17 2138  . mometasone-formoterol (DULERA) 200-5 MCG/ACT inhaler 2 puff  2 puff Inhalation BID Lance Coon, MD   2 puff at 02/02/17 1952  . morphine 2 MG/ML injection 1-2 mg  1-2 mg Intravenous Q4H PRN Harrie Foreman, MD      . nystatin (MYCOSTATIN) 100000 UNIT/ML suspension 500,000 Units  5 mL Oral QID Fritzi Mandes, MD   500,000 Units at 02/02/17 2138  . ondansetron (ZOFRAN) tablet 4 mg  4 mg Oral Q6H PRN Lance Coon, MD       Or  . ondansetron [facility]) injection 4 mg  4 mg Intravenous Q6H PRN Lance Coon, MD   4 mg at 02/02/17 1420  . pantoprazole (PROTONIX) EC tablet 40 mg  40 mg Oral BID Lance Coon, MD   40 mg at 02/02/17 2139  . QUEtiapine (SEROQUEL) tablet 50 mg  50 mg Oral Corwin Levins, MD   50 mg at 02/02/17 2137  . sodium chloride flush (NS) 0.9 % injection 3 mL  3 mL Intravenous Q12H Fritzi Mandes, MD   3 mL at 02/02/17 2138  . tamsulosin (FLOMAX) capsule 0.4 mg  0.4 mg Oral Daily Lance Coon, MD   0.4 mg at 02/02/17 1039    OBJECTIVE: Vitals:   02/02/17 0813 02/02/17 2022  BP: (!) 108/58 107/61    Pulse: (!) 110 (!) 118  Resp: 18 17  Temp:  97.8 F (36.6 C)  SpO2: 96% 92%     Body mass index is 30.93 kg/m.    ECOG FS:3 - Symptomatic, >50% confined to bed  General: Well-developed, well-nourished, no acute distress. Eyes: Pink conjunctiva, anicteric sclera. HEENT: Normocephalic, moist mucous membranes, clear oropharnyx. Lungs: Clear to auscultation  bilaterally. Heart: Regular rate and rhythm. No rubs, murmurs, or gallops. Abdomen: Soft, nontender, nondistended. No organomegaly noted, normoactive bowel sounds. Musculoskeletal: No edema, cyanosis, or clubbing. Neuro: Alert, answering all questions appropriately. Cranial nerves grossly intact. Skin: No rashes or petechiae noted. Psych: Normal affect. Lymphatics: No cervical, calvicular, axillary or inguinal LAD.   LAB RESULTS:  Lab Results  Component Value Date   NA 138 02/02/2017   K 3.4 (L) 02/02/2017   CL 113 (H) 02/02/2017   CO2 21 (L) 02/02/2017   GLUCOSE 153 (H) 02/02/2017   BUN 35 (H) 02/02/2017   CREATININE 1.64 (H) 02/02/2017   CALCIUM 6.3 (LL) 02/02/2017   PROT 6.7 01/31/2017   ALBUMIN 2.3 (L) 02/02/2017   AST 31 01/31/2017   ALT 23 01/31/2017   ALKPHOS 93 01/31/2017   BILITOT 1.5 (H) 01/31/2017   GFRNONAA 37 (L) 02/02/2017   GFRAA 42 (L) 02/02/2017    Lab Results  Component Value Date   WBC 1.4 (LL) 02/02/2017   NEUTROABS 0.4 (L) 01/31/2017   HGB 9.8 (L) 02/02/2017   HCT 29.4 (L) 02/02/2017   MCV 94.9 02/02/2017   PLT 130 (L) 02/02/2017     STUDIES: Ct Head Wo Contrast  Result Date: 01/31/2017 CLINICAL DATA:  Altered level of consciousness. 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 Patient is on chemotherapy and also has dementia. Dyspnea, tachycardia and diarrhea times 4-5 days. EXAM: CT HEAD WITHOUT CONTRAST TECHNIQUE: Contiguous axial images were obtained from the base of the skull through the vertex without intravenous  contrast. COMPARISON:  None. FINDINGS: Brain: No evidence of acute infarction, hemorrhage, hydrocephalus, extra-axial collection or mass lesion/mass effect. Generalized superficial and central atrophy with mild to moderate small vessel ischemic disease of periventricular white matter. No acute intracranial hemorrhage, midline shift or edema. Basal ganglial calcifications are noted bilaterally. No intra-axial mass nor extra-axial fluid collections. The basal cisterns and fourth ventricle are midline without effacement. Mild cerebellar atrophy is noted as well. Vascular: No hyperdense vessels. ) Sclerosis the carotid siphons. Skull: No fracture or suspicious osseous lesions. Sinuses/Orbits: Status post bilateral cataract extractions. Intact globes. No retrobulbar abnormality. Clear paranasal sinuses. Other: Clear mastoids. IMPRESSION: Atrophy with chronic appearing small vessel ischemic disease. No acute intracranial abnormality noted. Electronically Signed   By: Ashley Royalty M.D.   On: 01/31/2017 19:30   Dg Chest Port 1 View  Result Date: 01/31/2017 CLINICAL DATA:  Weakness and shortness of breath for 5 days. Ex-smoker. Diabetes. EXAM: PORTABLE CHEST 1 VIEW COMPARISON:  PET of 12/08/2016.  Chest radiograph of 07/18/2016 FINDINGS: Two frontal radiographs. A right Port-A-Cath terminates at the low SVC. Remote right rib trauma. Midline trachea. Mild cardiomegaly with transverse aortic atherosclerosis. Possible residual small left pleural effusion. No pneumothorax. Mild hyperinflation. No lobar consolidation. Mild left base scarring. IMPRESSION: No acute cardiopulmonary disease. Cardiomegaly.  Aortic Atherosclerosis (ICD10-I70.0). Possible small left pleural effusion. Electronically Signed   By: Abigail Miyamoto M.D.   On: 01/31/2017 17:47    ASSESSMENT: Stage IV esophageal cancer, progressive weakness and fatigue, neutropenia  PLAN:    1. 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 After lengthy discussion with the patient and his son, he is unsure if he wishes to pursue additional treatment. Plan is to keep his previously scheduled follow-up appointment in 1 week to further discuss his options, including hospice. No further intervention  is needed at this time. 2. Neutropenia: Secondary to chemotherapy. Improving. Continue daily Neupogen. 3. Weakness and fatigue: Patient may benefit from temporary rehabilitation upon discharge. 4. Right renal cell carcinoma: Patient had cryoablation performed on July 23, 2016.  5. Renal insufficiency: Patient's creatinine is approximately his baseline, monitor. 6. Bony metastatic disease: No Xgeva as above secondary to history of severe hypocalcemia. 7. Poor appetite: Continue Megace.  Will follow.  Lloyd Huger, MD   02/02/2017 10:26 PM

## 2017-02-03 ENCOUNTER — Inpatient Hospital Stay (HOSPITAL_COMMUNITY)
Admit: 2017-02-03 | Discharge: 2017-02-03 | Disposition: A | Discharge status: Home / Self Care | Payer: Medicare Other | Attending: Internal Medicine | Admitting: Internal Medicine

## 2017-02-03 ENCOUNTER — Encounter: Payer: Self-pay | Admitting: Physician Assistant

## 2017-02-03 DIAGNOSIS — I340 Nonrheumatic mitral (valve) insufficiency: Secondary | ICD-10-CM

## 2017-02-03 LAB — GLUCOSE, CAPILLARY
GLUCOSE-CAPILLARY: 138 mg/dL — AB (ref 65–99)
GLUCOSE-CAPILLARY: 136 mg/dL — AB (ref 65–99)
Glucose-Capillary: 150 mg/dL — ABNORMAL HIGH (ref 65–99)
Glucose-Capillary: 126 mg/dL — ABNORMAL HIGH (ref 65–99)

## 2017-02-03 LAB — BASIC METABOLIC PANEL
Anion gap: 13 (ref 5–15)
BUN: 42 mg/dL — ABNORMAL HIGH (ref 6–20)
CALCIUM: 6.6 mg/dL — AB (ref 8.9–10.3)
CO2: 18 mmol/L — AB (ref 22–32)
CREATININE: 1.67 mg/dL — AB (ref 0.61–1.24)
Chloride: 106 mmol/L (ref 101–111)
GFR calc Af Amer: 41 mL/min — ABNORMAL LOW (ref >60)
GFR, EST NON AFRICAN AMERICAN: 36 mL/min — AB (ref >60)
GLUCOSE: 160 mg/dL — AB (ref 65–99)
Potassium: 3.8 mmol/L (ref 3.5–5.1)
Sodium: 137 mmol/L (ref 135–145)

## 2017-02-03 LAB — MAGNESIUM: Magnesium: 1.4 mg/dL — ABNORMAL LOW (ref 1.7–2.4)

## 2017-02-03 MED ORDER — DILTIAZEM HCL 30 MG PO TABS
30.0000 mg | ORAL_TABLET | Freq: Four times a day (QID) | ORAL | Status: DC
  Administered 2017-02-03 (×2): 30 mg via ORAL
  Filled 2017-02-03 (×2): qty 1

## 2017-02-03 MED ORDER — METOPROLOL TARTRATE 25 MG PO TABS
25.0000 mg | ORAL_TABLET | Freq: Four times a day (QID) | ORAL | Status: DC
  Administered 2017-02-03 – 2017-02-04 (×2): 25 mg via ORAL
  Filled 2017-02-03 (×2): qty 1

## 2017-02-03 MED ORDER — POTASSIUM CHLORIDE CRYS ER 20 MEQ PO TBCR
40.0000 meq | EXTENDED_RELEASE_TABLET | Freq: Once | ORAL | Status: AC
  Administered 2017-02-03: 40 meq via ORAL
  Filled 2017-02-03: qty 2

## 2017-02-03 MED ORDER — TBO-FILGRASTIM 300 MCG/0.5ML ~~LOC~~ SOSY
300.0000 ug | PREFILLED_SYRINGE | Freq: Once | SUBCUTANEOUS | Status: AC
  Administered 2017-02-03: 300 ug via SUBCUTANEOUS
  Filled 2017-02-03 (×2): qty 0.5

## 2017-02-03 MED ORDER — MAGNESIUM SULFATE 4 GM/100ML IV SOLN
4.0000 g | Freq: Once | INTRAVENOUS | Status: AC
  Administered 2017-02-03: 4 g via INTRAVENOUS
  Filled 2017-02-03: qty 100

## 2017-02-03 MED ORDER — DILTIAZEM HCL 30 MG PO TABS: 30.0000 mg | ORAL_TABLET | Freq: Four times a day (QID) | ORAL | Status: DC

## 2017-02-03 NOTE — Progress Notes (Signed)
Pharmacy Electrolyte Monitoring   Pharmacy consulted to replace electrolytes in this 83 y/o M with atrial fibrillation.   Sodium (mmol/L)  Date Value  02/03/2017 137   Potassium (mmol/L)  Date Value  02/03/2017 3.8   Magnesium (mg/dL)  Date Value  02/03/2017 1.4 (L)   Calcium (mg/dL)  Date Value  02/03/2017 6.6 (L)   Albumin (g/dL)  Date Value  02/02/2017 2.3 (L)   Goal:  K > 4 Mg > 2  Will replace magnesium with 4 g magnesium sulfate and K with 40 meq KCl once. Will recheck electrolytes with AM labs.   Ulice Dash, PharmD Clinical Pharmacist

## 2017-02-03 NOTE — Consult Note (Signed)
Cardiology Consultation:   Patient ID: Miguel [name].; [id number]; 09-02-1929   Admit date: 01/31/2017 Date of Consult: 02/03/2017  Primary Care Provider: Leone Haven, MD Primary Cardiologist: End   Patient Profile:   Miguel [name]. is a 83 y.o. 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 Posey Pronto, MD.  History of Present Illness:   Miguel [name] previously underwent echo in 05/2016 and Lexiscan Myoview in 06/2016 for chronic exertional dyspnea. Echo showed EF 50-55%, normal RV size and systolic function with mild left atrial enlargement without significant valvular abnormalities. Lexiscan Myoview was low risk without ischemia or scar. EF 52%, which was on par with EF by echocardiogram. He underwent renal cell cryoablation in the spring of 2018 without incident. He has been treated with single agent Taxol since 12/2016 for his esophageal cancer with the last dose being on 01/27/2017. He typically notes increased fatigue following chemotherapy, though after his most recent treatment on 01/27/17 his wife felt like he was even weaker than he usually is. There was associated decrease PO intake for several days leading up to his admission as well as watery diarrhea. He was more confused as well. Because of these symptoms he was admitted on 10/20 for further evaluation. He has been in Afib with RVR with heart rates in the 120s to 140s since his admission. Labs showed a WBC 0.6, HGB 11.3, PLT 199, SCr 1,61-->1.67, K+ 3.7-->3.4-->3.8, magnesium 1.4, albumin 2.3, GI panel and C diff negative, blood cultures with no growth to date x 2, urine culture with no growth, and troponin 0.03 not  cycled. CT head not acute. CXR not acute. He has been consulted on by oncology with plans for further discussion of treatment goals planned as an outpatient. He was started on short-acting diltiazem on the morning of 10/23 due to Afib with RVR with improved heart rates into the 90s bpm. He remains on Lopressor 25 mg bid.   History is taken from prior notes and the patient's son. The patient will open his eyes when spoken to though is not able to provide an accurate medical history due to his dementia and somnolence.   Past Medical History:  Diagnosis Date  . Alcoholism ([place])   . Anxiety   . COPD (chronic obstructive pulmonary disease) ([place])   . Dementia 03/18/2016  . DM type 2 with diabetic peripheral neuropathy ([place]) 03/18/2016  . Dyspnea    slight   . Dysrhythmia    atrial fib   . Esophageal cancer ([place])   . Esophageal stricture   . GERD (gastroesophageal reflux disease) 03/18/2016  . Hyperlipidemia   . Irregular heart rhythm   . Kidney carcinoma ([place])    right, sp ablation  . Macular degeneration   . Neuropathy   . Renal mass     Past Surgical History:  Procedure Laterality Date  . BACK SURGERY    . CATARACT EXTRACTION    . CYSTOSCOPY W/ RETROGRADES Right 07/21/2016   Procedure: CYSTOSCOPY WITH RETROGRADE PYELOGRAM;  Surgeon: Hollice Espy, MD;  Location: [facility] ORS;  Service: Urology;  Laterality: Right;  . CYSTOSCOPY WITH STENT PLACEMENT Right 07/21/2016   Procedure: CYSTOSCOPY WITH STENT PLACEMENT;  Surgeon: Hollice Espy, MD;  Location: [facility] ORS;  Service: Urology;  Laterality: Right;  . IR RADIOLOGIST EVAL & MGMT  09/09/2016  . IR RADIOLOGIST EVAL & MGMT  06/19/2016  . IR RADIOLOGIST EVAL & MGMT  11/12/2016  . KNEE SURGERY    . RADIOLOGY WITH ANESTHESIA N/A 07/23/2016   Procedure: RENAL CRYOABLATION;  Surgeon: Aletta Edouard, MD;  Location: [facility];  Service: Radiology;  Laterality: N/A;     Home Meds: Prior to Admission medications   Medication Sig Start Date End Date Taking?  Authorizing Provider  ADVAIR DISKUS 250-50 MCG/DOSE AEPB Inhale 1 puff into the lungs 2 (two) times daily. 10/27/16  Yes Cook, Jayce G, DO  ALPRAZolam (XANAX) 0.5 MG tablet Take 1 tablet (0.5 mg total) by mouth 3 (three) times daily as needed. for anxiety 01/20/17  Yes Lloyd Huger, MD  aspirin 81 MG chewable tablet Chew 1 tablet (81 mg total) by mouth daily. 06/07/16  Yes Theodoro Grist, MD  atorvastatin (LIPITOR) 40 MG tablet Take 1 tablet (40 mg total) by mouth daily. 03/20/16  Yes Cook, Jayce G, DO  donepezil (ARICEPT) 10 MG tablet TAKE 1 TABLET AT BEDTIME 11/06/16  Yes Cook, Jayce G, DO  fesoterodine (TOVIAZ) 8 MG TB24 tablet Take 1 tablet (8 mg total) by mouth daily. 08/27/16  Yes Cook, Jayce G, DO  furosemide (LASIX) 20 MG tablet Take 1 tablet (20 mg total) by mouth daily. 12/18/16  Yes Cook, [place] G, DO  glucose blood test strip Use as instructed 12/18/16  Yes Cook, Jayce G, DO  ketoconazole (NIZORAL) 2 % cream Apply 1 application topically daily. 03/25/16  Yes Thersa Salt G, DO  lidocaine-prilocaine (EMLA) cream Apply to affected area once 12/05/16  Yes Finnegan, Kathlene November, MD  megestrol (MEGACE) 40 MG tablet Take 1 tablet (40 mg total) by mouth daily. 12/23/16  Yes Lloyd Huger, MD  metFORMIN (GLUCOPHAGE) 1000 MG tablet Take 1,000 mg by mouth 2 (two) times daily with a meal.   Yes [provider]  metoprolol tartrate (LOPRESSOR) 25 MG tablet Take 0.5 tablets (12.5 mg total) by mouth 2 (two) times daily. 06/06/16  Yes Theodoro Grist, MD  Multiple Vitamins-Minerals (CENTRUM SILVER PO) Take 1 tablet by mouth daily.   Yes [provider]  omeprazole (PRILOSEC) 20 MG capsule Take 1 capsule (20 mg total) by mouth 2 (two) times daily before a meal. 03/18/16  Yes Cook, Jayce G, DO  ondansetron (ZOFRAN) 8 MG tablet Take 1 tablet (8 mg total) by mouth 2 (two) times daily as needed (Nausea or vomiting). 12/05/16  Yes Lloyd Huger, MD  polyethylene glycol (MIRALAX / GLYCOLAX)  packet Take 17 g by mouth daily. 06/07/16  Yes Theodoro Grist, MD  prochlorperazine (COMPAZINE) 10 MG tablet Take 1 tablet (10 mg total) by mouth every 6 (six) hours as needed (Nausea or vomiting). 12/05/16  Yes Lloyd Huger, MD  QUEtiapine (SEROQUEL) 50 MG tablet Take 1 tablet (50 mg total) by mouth at bedtime. 11/06/16  Yes Coral Spikes, DO    Inpatient Medications: Scheduled Meds: . aspirin  81 mg Oral Daily  . atorvastatin  40 mg Oral Daily  . diltiazem  30 mg Oral Q6H  . donepezil  10 mg Oral QHS  . enoxaparin (LOVENOX) injection  40 mg Subcutaneous Q24H  . feeding supplement (GLUCERNA SHAKE)  237 mL Oral TID BM  . insulin aspart  0-5 Units Subcutaneous QHS  . insulin aspart  0-9 Units Subcutaneous  TID WC  . megestrol  40 mg Oral Daily  . metoprolol tartrate  5 mg Intravenous Once  . metoprolol tartrate  25 mg Oral BID  . mometasone-formoterol  2 puff Inhalation BID  . nystatin  5 mL Oral QID  . pantoprazole  40 mg Oral BID  . QUEtiapine  50 mg Oral QHS  . sodium chloride flush  3 mL Intravenous Q12H  . tamsulosin  0.4 mg Oral Daily  . Tbo-Filgrastim  300 mcg Subcutaneous Once   Continuous Infusions: . magnesium sulfate 1 - 4 g bolus IVPB 4 g (02/03/17 1253)   PRN Meds: acetaminophen **OR** acetaminophen, ALPRAZolam, diphenoxylate-atropine, metoprolol tartrate, morphine injection, ondansetron **OR** ondansetron (ZOFRAN) IV  Allergies:  No Known Allergies  Social History:   Social History   Social History  . Marital status: Married    Spouse name: N/A  . Number of children: N/A  . Years of education: N/A   Occupational History  . Not on file.   Social History Main Topics  . Smoking status: Former Smoker    Types: Cigarettes    Quit date: 32  . Smokeless tobacco: Never Used  . Alcohol use No     Comment: quit 2012   . Drug use: No  . Sexual activity: Not Currently    Partners: Female   Other Topics Concern  . Not on file   Social History Narrative   . No narrative on file     Family History:   Family History  Problem Relation Age of Onset  . Ovarian cancer Sister   . Throat cancer Brother   . Diabetes Mother   . Kidney disease Father   . Prostate cancer Neg Hx   . Kidney cancer Neg Hx     ROS:  Review of Systems  Unable to perform ROS: Dementia      Physical Exam/Data:   Vitals:   02/02/17 2022 02/03/17 0418 02/03/17 0755 02/03/17 1117  BP: 107/61 109/71 113/89 (!) 106/56  Pulse: (!) 118 (!) 125 83 (!) 127  Resp: 17 17 20 20   Temp: 97.8 F (36.6 C) 97.7 F (36.5 C)  98.2 F (36.8 C)  TempSrc: Oral Oral    SpO2: 92% 94% 95% 97%  Weight:  237 lb 14.4 oz (107.9 kg)    Height:       No intake or output data in the 24 hours ending 02/03/17 1403 Filed Weights   02/01/17 0351 02/02/17 0502 02/03/17 0418  Weight: 234 lb 6.4 oz (106.3 kg) 234 lb 6.4 oz (106.3 kg) 237 lb 14.4 oz (107.9 kg)   Body mass index is 31.39 kg/m.   Physical Exam: General: Well developed, well nourished, in no acute distress. Head: Normocephalic, atraumatic, sclera non-icteric, no xanthomas, nares without discharge.  Neck: Negative for carotid bruits. JVD not elevated. Lungs: Clear bilaterally to auscultation without wheezes, rales, or rhonchi. Breathing is unlabored. Heart: Irregularly irregular with S1 S2. No murmurs, rubs, or gallops appreciated. Abdomen: Soft, non-tender, non-distended with normoactive bowel sounds. No hepatomegaly. No rebound/guarding. No obvious abdominal masses. Msk:  Strength and tone appear normal for age. Extremities: No clubbing or cyanosis. No edema. Distal pedal pulses are 2+ and equal bilaterally. Neuro: Somnolent, opens eyes when spoken to. Psych:  Opens eyes with spoken to.   EKG:  The EKG was personally reviewed and demonstrates: Afib with RVR, 125 bpm, nonspecific st/t changes. Telemetry:  Telemetry was personally reviewed and demonstrates: Afib, 90s bpm currently. Episodes of Afib  with RVR into the 120s  bpm. 27 beats of WCT concerning for NSVT vs Afib with RVR with aberrancy. 6 beats of NSVT.   Weights: Filed Weights   02/01/17 0351 02/02/17 0502 02/03/17 0418  Weight: 234 lb 6.4 oz (106.3 kg) 234 lb 6.4 oz (106.3 kg) 237 lb 14.4 oz (107.9 kg)    Relevant CV Studies: TTE 06/04/2016: Study Conclusions  - Left ventricle: The cavity size was normal. Systolic function was   normal. The estimated ejection fraction was in the range of 50%   to 55%. - Left atrium: The atrium was mildly dilated.   Lexiscan Myoview 07/07/2016: Study Result    There was no ST segment deviation noted during stress.  No T wave inversion was noted during stress.  The study is normal.  This is a low risk study.  Nuclear stress EF: 52%.     Laboratory Data:  Chemistry Recent Labs Lab 02/01/17 0457 02/02/17 0451 02/03/17 1040  NA 136 138 137  K 3.7 3.4* 3.8  CL 108 113* 106  CO2 19* 21* 18*  GLUCOSE 125* 153* 160*  BUN 26* 35* 42*  CREATININE 1.61* 1.64* 1.67*  CALCIUM 7.0* 6.3* 6.6*  GFRNONAA 37* 37* 36*  GFRAA 43* 42* 41*  ANIONGAP 9 4* 13     Recent Labs Lab 01/31/17 1730 02/02/17 0451  PROT 6.7  --   ALBUMIN 3.4* 2.3*  AST 31  --   ALT 23  --   ALKPHOS 93  --   BILITOT 1.5*  --    Hematology Recent Labs Lab 01/31/17 1730 02/01/17 0457 02/02/17 0456  WBC 0.6* 0.9* 1.4*  RBC 3.48* 3.28* 3.10*  HGB 11.3* 10.4* 9.8*  HCT 32.5* 30.9* 29.4*  MCV 93.6 94.3 94.9  MCH 32.4 31.6 31.7  MCHC 34.7 33.5 33.4  RDW 16.9* 17.1* 17.4*  PLT 199 151 130*   Cardiac Enzymes Recent Labs Lab 01/31/17 1730  TROPONINI 0.03*   No results for input(s): TROPIPOC in the last 168 hours.  BNPNo results for input(s): BNP, PROBNP in the last 168 hours.  DDimer No results for input(s): DDIMER in the last 168 hours.  Radiology/Studies:  Ct Head Wo Contrast  Result Date: 01/31/2017 IMPRESSION: Atrophy with chronic appearing small vessel ischemic disease. No acute intracranial abnormality  noted. Electronically Signed   By: Ashley Royalty M.D.   On: 01/31/2017 19:30   Dg Chest Port 1 View  Result Date: 01/31/2017 IMPRESSION: No acute cardiopulmonary disease. Cardiomegaly.  Aortic Atherosclerosis (ICD10-I70.0). Possible small left pleural effusion. Electronically Signed   By: Abigail Miyamoto M.D.   On: 01/31/2017 17:47    Assessment and Plan:   1. Persistent Afib with RVR/NSVT: -Rates improved in the 90s bpm currently -Tachycardic rates likely in the setting of acute illness/dehydration with diarrhea, electrolyte abnormalities, and anemia -Continue Lopressor 25 mg bid, if needed for added rate control could make q 6-8 hours as BP allows -Continue short-acting diltiazem for today with planned consolidation on 10/24 likely -He is not a good candidate for digoxin given CKD -He is not a good candidate for rhythm control at this time given he has not been adequately anticoagulated and will likely not be a candidate for full-dose anticoagulation given his comorbidities including stage IV esophageal cancer, dementia, and anemia  -He would not likely tolerate aggressive measures for rhythm control such as DCCV -He is at increased risk of stroke given his CHADS2VASc of at least 3 (age x 2, DM) -Agree with  correction of electrolyte abnormalities  -Recent TSH from 05/2016 normal  2. HFpEF: -He does not appear volume overloaded at this time -No need to repeat echo at this time given his comorbidiites, this would not change his long term outcome  -Diurese as needed -Not on spironolactone given CKD  3. Stage IV esophageal cancer with metastatic disease to the liver and bones: -Oncology on board -Patient and family are considering no further   4. Weakness/fatigue/diarrhea: -Likely multifactorial including: his stage IV esophageal cancer, poor PO intake, diarrhea with volume depletion, anemia, Afib with RVR, and deconditioning  -Would benefit from palliative care consult to discuss goals of  care  5. Neutropenia: -Due to recent chemotherapy  -Oncology on board as above -Neupogen per IM  6. Acute on CKD stage II: -Monitor -Avoid nephrotoxins   7. Hypokalemia/hypomagnesemia: -Replete potassium to goal of > 4.0 and magnesium to goal > 2.0  8. Anemia: -Stable -Maintain HGB > 8.5   For questions or updates, please contact [place] Please consult [email or web address] for contact info under Cardiology/STEMI.   Signed, Christell Faith, PA-C [place] Pager: [phone] 02/03/2017, 2:03 PM

## 2017-02-03 NOTE — Progress Notes (Signed)
Physical Therapy Treatment Patient Details Name: Miguel [name]. MRN: [id number] DOB: Aug 26, 1929 Today's Date: 02/03/2017    History of Present Illness Pt is an 83 y.o. 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 Pt recently received chemotherapy (01/26/17) and usually experiences slight SOB and generalized weakness following treatment. Pt presents to ED 01/31/17 with SOB, diarrhea, confusion and worsening weakness over the past 4-5 days secondary to chemotherapy-induced neutropenia. CT of head confirms atrophy with chronic appearing small vessel ischemic disease; no other acute abnormalties noted. Chest imaging confirms small L pleural effusion.    PT Comments    Pt able to perform sit to stand x2 trials with min guard for safety; ambulated 7ft to chair with RW, min guard. PT noted generalized LE weakness with sit to stand and gait; pt reports some c/o dizziness upon standing. Heart rate monitored throughout session; 93 supine beginning of session, 123 after activity, 95 seated end of session. Next session plan to progress gait and activity tolerance as appropriate.    Follow Up Recommendations  Home health PT     Equipment Recommendations  Rolling walker with 5" wheels    Recommendations for Other Services       Precautions / Restrictions Precautions Precautions: Fall;Other (comment) Precaution Comments: protective precautions Restrictions Weight Bearing Restrictions: No    Mobility  Bed Mobility Overal bed mobility: Modified Independent Bed Mobility: Supine to Sit     Supine to sit: Modified independent (Device/Increase time);HOB elevated     General bed mobility comments: Mod I with HOB elevated; increased time to perform, otherwise no difficulties noted  Transfers Overall transfer level: Needs assistance Equipment used: Rolling walker (2  wheeled) Transfers: Sit to/from Stand Sit to Stand: Min guard         General transfer comment: min guard for safety; vc's for pushing off bed with hands; increased time/effort to perform  Ambulation/Gait Ambulation/Gait assistance: Min guard Ambulation Distance (Feet): 4 Feet Assistive device: Rolling walker (2 wheeled)       General Gait Details: decreased gait speed; short, shuffling steps   Stairs            Wheelchair Mobility    Modified Rankin (Stroke Patients Only)       Balance Overall balance assessment: Needs assistance Sitting-balance support: Feet supported Sitting balance-Leahy Scale: Fair Sitting balance - Comments: able to maintain seated balance EOB with hands in lap, feet supported   Standing balance support: Bilateral upper extremity supported Standing balance-Leahy Scale: Fair Standing balance comment: BUE support on RW in standing                            Cognition Arousal/Alertness: Awake/alert Behavior During Therapy: WFL for tasks assessed/performed (slightly agitated when asked questions, easily calmed down) Overall Cognitive Status: Within Functional Limits for tasks assessed                                        Exercises      General Comments        Pertinent Vitals/Pain Pain Assessment: No/denies pain    Home Living                      Prior Function  PT Goals (current goals can now be found in the care plan section) Acute Rehab PT Goals Patient Stated Goal: to go home PT Goal Formulation: With patient Time For Goal Achievement: 02/16/17 Potential to Achieve Goals: Fair Progress towards PT goals: Progressing toward goals    Frequency    Min 2X/week      PT Plan Current plan remains appropriate    Co-evaluation              AM-PAC PT "6 Clicks" Daily Activity  Outcome Measure  Difficulty turning over in bed (including adjusting bedclothes,  sheets and blankets)?: A Little Difficulty moving from lying on back to sitting on the side of the bed? : A Little Difficulty sitting down on and standing up from a chair with arms (e.g., wheelchair, bedside commode, etc,.)?: A Little Help needed moving to and from a bed to chair (including a wheelchair)?: A Lot Help needed walking in hospital room?: A Little Help needed climbing 3-5 steps with a railing? : A Lot 6 Click Score: 16    End of Session Equipment Utilized During Treatment: Gait belt Activity Tolerance: Patient tolerated treatment well Patient left: in chair;with call bell/phone within reach;with chair alarm set;with family/visitor present Nurse Communication: Mobility status (heart rate status) PT Visit Diagnosis: Unsteadiness on feet (R26.81);Muscle weakness (generalized) (M62.81);History of falling (Z91.81);Other abnormalities of gait and mobility (R26.89)     Time: 7616-0737 PT Time Calculation (min) (ACUTE ONLY): 23 min  Charges:                       G CodesWetzel Bjornstad, SPT 02/03/2017, 4:25 PM

## 2017-02-03 NOTE — Progress Notes (Signed)
[place] [facility] [place] NAME: Miguel [name]    MR#:  [id number]  DATE OF BIRTH:  Jul 06, 1929  SUBJECTIVE:   Patient appears to be improving.  Sitting out of the recliner.  Had some Glucerna and little bit of heartburn in the morning. Heart rate in the 140s REVIEW OF SYSTEMS:   Review of Systems  Constitutional: Negative for chills, fever and weight loss.  HENT: Positive for sore throat. Negative for ear discharge, ear pain and nosebleeds.   Eyes: Negative for blurred vision, pain and discharge.  Respiratory: Negative for sputum production, shortness of breath, wheezing and stridor.   Cardiovascular: Negative for chest pain, palpitations, orthopnea and PND.  Gastrointestinal: Positive for diarrhea and nausea. Negative for abdominal pain and vomiting.  Genitourinary: Negative for frequency and urgency.  Musculoskeletal: Negative for back pain and joint pain.  Neurological: Positive for weakness. Negative for sensory change, speech change and focal weakness.  Psychiatric/Behavioral: Negative for depression and hallucinations. The patient is not nervous/anxious.    Tolerating Diet:very little Tolerating PT: pending  DRUG ALLERGIES:  No Known Allergies  VITALS:  Blood pressure (!) 106/56, pulse (!) 127, temperature 98.2 F (36.8 C), resp. rate 20, height 6\' 1"  (1.854 m), weight 107.9 kg (237 lb 14.4 oz), SpO2 97 %.  PHYSICAL EXAMINATION:   Physical Exam  GENERAL:  83 y.o.-year-old patient lying in the bed with no acute distress. Weak, ill appearing EYES: Pupils equal, round, reactive to light and accommodation. No scleral icterus. Extraocular muscles intact.  HEENT: Head atraumatic, normocephalic, oral thrush NECK:  Supple, no jugular venous distention. No thyroid enlargement, no tenderness.  LUNGS: Normal breath sounds bilaterally, no wheezing, rales, rhonchi. No use of accessory muscles of respiration.  CARDIOVASCULAR: S1, S2  normal. No murmurs, rubs, or gallops.  ABDOMEN: Soft, nontender, nondistended. Bowel sounds present. No organomegaly or mass.  EXTREMITIES: No cyanosis, clubbing or edema b/l.    NEUROLOGIC: Cranial nerves II through XII are intact. No focal Motor or sensory deficits b/l.   PSYCHIATRIC:  patient is alert and oriented x 3.  SKIN: No obvious rash, lesion, or ulcer.   LABORATORY PANEL:  CBC  Recent Labs Lab 02/02/17 0456  WBC 1.4*  HGB 9.8*  HCT 29.4*  PLT 130*    Chemistries   Recent Labs Lab 01/31/17 1730  02/03/17 1040  NA 137  < > 137  K 4.1  < > 3.8  CL 104  < > 106  CO2 24  < > 18*  GLUCOSE 141*  < > 160*  BUN 25*  < > 42*  CREATININE 1.42*  < > 1.67*  CALCIUM 8.0*  < > 6.6*  MG  --   --  1.4*  AST 31  --   --   ALT 23  --   --   ALKPHOS 93  --   --   BILITOT 1.5*  --   --   < > = values in this interval not displayed. Cardiac Enzymes  Recent Labs Lab 01/31/17 1730  TROPONINI 0.03*   RADIOLOGY:  No results found. ASSESSMENT AND PLAN:   Miguel [name]  is a 82 y.o. male who presents with weakness, mildly increased confusion, A. fib with RVR. Patient's history is given mostly by his wife is a patient is unable to contribute very clearly to his own history of present illness. Wife states that he got chemotherapy earlier this week. She states he typically will get  somewhat weak after chemotherapy, but this time he has gotten much weaker than normal. He also is more confused than normal  *General weakness - likely due to deconditioning, his chemotherapy, profound dehydration, poor by mouth intake. Cont IVF Encourage oral glucerna/ensure  *  Atrial fibrillation with RVR (HCC)  -metoprolol 25 mg bid -Add Cardizem 30 mg every 6 hourly - cardiology consultation placed -Echo 05/2016 showed EF 55% - defer anticoagulation to cardiology  *Neutropenia due to chemo for Stage IV Squamous cell esophageal cancer [facility])  - oncology consult appreciated -pt is neutropenic  due to recent chemo -no fever -start Neupogen 300 mcg qd -wbc 0.6--0.9--Neupogen--1.4--neupogen--  *Diarrhea -C diff and GI PCR negative -?chemo related  *  DM type 2 with diabetic peripheral neuropathy (HCC) - siding scale insulin with corresponding glucose checks  *  COPD (chronic obstructive pulmonary disease) ([place]) - continue home meds  *  GERD without esophagitis - home dose PPI  * Oral thrush nystatin S and S  *  CKD (chronic kidney disease), stage III ([place]) - avoid nephrotoxins  PT  Recommends HHPT  Spoke with wife and dr Grayland Ormond  Case discussed with Care Management/Social Worker. Management plans discussed with the patient, family and they are in agreement.  CODE STATUS: full  DVT Prophylaxis: scd  TOTAL TIME TAKING CARE OF THIS PATIENT: *30* minutes.  >50% time spent on counselling and coordination of care  POSSIBLE D/C IN **30 DAYS, DEPENDING ON CLINICAL CONDITION.  Note: This dictation was prepared with Dragon dictation along with smaller phrase technology. Any transcriptional errors that result from this process are unintentional.  Beautifull Cisar M.D on 02/03/2017 at 2:41 PM  Between 7am to 6pm - Pager - [phone]  After 6pm go to [email or web address] - password [email or web address] [email or web address] [facility]  Office  [phone]  CC: Primary care physician; Leone Haven, MD

## 2017-02-03 NOTE — Progress Notes (Signed)
18 beats of v. Tach reported from CCMD/ pt asymptomatic/ MD paged to make aware/ cardio consult ordered/ will continue to monitor.

## 2017-02-04 DIAGNOSIS — I5030 Unspecified diastolic (congestive) heart failure: Secondary | ICD-10-CM

## 2017-02-04 LAB — GLUCOSE, CAPILLARY
GLUCOSE-CAPILLARY: 137 mg/dL — AB (ref 65–99)
GLUCOSE-CAPILLARY: 171 mg/dL — AB (ref 65–99)
GLUCOSE-CAPILLARY: 119 mg/dL — AB (ref 65–99)
Glucose-Capillary: 119 mg/dL — ABNORMAL HIGH (ref 65–99)

## 2017-02-04 LAB — BASIC METABOLIC PANEL
Anion gap: 7 (ref 5–15)
BUN: 38 mg/dL — AB (ref 6–20)
CHLORIDE: 108 mmol/L (ref 101–111)
CO2: 23 mmol/L (ref 22–32)
Calcium: 6.7 mg/dL — ABNORMAL LOW (ref 8.9–10.3)
Creatinine, Ser: 1.53 mg/dL — ABNORMAL HIGH (ref 0.61–1.24)
GFR calc non Af Amer: 40 mL/min — ABNORMAL LOW (ref >60)
GFR, EST AFRICAN AMERICAN: 46 mL/min — AB (ref >60)
Glucose, Bld: 131 mg/dL — ABNORMAL HIGH (ref 65–99)
POTASSIUM: 3.9 mmol/L (ref 3.5–5.1)
SODIUM: 138 mmol/L (ref 135–145)

## 2017-02-04 LAB — CBC
HCT: 31.6 % — ABNORMAL LOW (ref 40.0–52.0)
HEMOGLOBIN: 10.4 g/dL — AB (ref 13.0–18.0)
MCH: 31 pg (ref 26.0–34.0)
MCHC: 32.9 g/dL (ref 32.0–36.0)
MCV: 94.2 fL (ref 80.0–100.0)
Platelets: 183 10*3/uL (ref 150–440)
RBC: 3.36 MIL/uL — ABNORMAL LOW (ref 4.40–5.90)
RDW: 18 % — ABNORMAL HIGH (ref 11.5–14.5)
WBC: 13.2 10*3/uL — ABNORMAL HIGH (ref 3.8–10.6)

## 2017-02-04 LAB — MAGNESIUM: MAGNESIUM: 2.1 mg/dL (ref 1.7–2.4)

## 2017-02-04 LAB — ECHOCARDIOGRAM COMPLETE
Height: 73 in
Weight: 3806.4 oz

## 2017-02-04 MED ORDER — METOPROLOL TARTRATE 50 MG PO TABS
50.0000 mg | ORAL_TABLET | Freq: Two times a day (BID) | ORAL | Status: DC
  Administered 2017-02-04 – 2017-02-08 (×8): 50 mg via ORAL
  Filled 2017-02-04 (×8): qty 1

## 2017-02-04 MED ORDER — POTASSIUM CHLORIDE CRYS ER 20 MEQ PO TBCR
40.0000 meq | EXTENDED_RELEASE_TABLET | Freq: Once | ORAL | Status: AC
  Administered 2017-02-04: 40 meq via ORAL
  Filled 2017-02-04: qty 2

## 2017-02-04 NOTE — Care Management (Signed)
Wife is wishing to pursue  skilled nursing placement.  Spoke with attending regarding palliative follow up at facility.  Of note, patient is currently full code. Patient very drowsy today thought to be a result of sleeping medication and a xanax. Family was to meet with oncology next week to discuss plan to address patient's stage esophageal cancer.

## 2017-02-04 NOTE — Progress Notes (Signed)
Progress Note  Patient Name: Miguel [name]. Date of Encounter: 02/04/2017  Primary Cardiologist: End  Subjective   Heart rates reasonably well controlled. Wife at bedside.   Inpatient Medications    Scheduled Meds: . aspirin  81 mg Oral Daily  . atorvastatin  40 mg Oral Daily  . donepezil  10 mg Oral QHS  . enoxaparin (LOVENOX) injection  40 mg Subcutaneous Q24H  . feeding supplement (GLUCERNA SHAKE)  237 mL Oral TID BM  . insulin aspart  0-5 Units Subcutaneous QHS  . insulin aspart  0-9 Units Subcutaneous TID WC  . megestrol  40 mg Oral Daily  . metoprolol tartrate  5 mg Intravenous Once  . metoprolol tartrate  50 mg Oral BID  . mometasone-formoterol  2 puff Inhalation BID  . nystatin  5 mL Oral QID  . pantoprazole  40 mg Oral BID  . QUEtiapine  50 mg Oral QHS  . sodium chloride flush  3 mL Intravenous Q12H  . tamsulosin  0.4 mg Oral Daily   Continuous Infusions:  PRN Meds: acetaminophen **OR** acetaminophen, ALPRAZolam, diphenoxylate-atropine, metoprolol tartrate, ondansetron **OR** ondansetron (ZOFRAN) IV   Vital Signs    Vitals:   02/03/17 2014 02/04/17 0339 02/04/17 0500 02/04/17 0816  BP: (!) 101/58 107/67  (!) 115/56  Pulse: (!) 102 (!) 110  (!) 113  Resp:    18  Temp: 98.1 F (36.7 C) 97.7 F (36.5 C)    TempSrc: Oral Oral    SpO2: 94% 92%  91%  Weight:   237 lb 11.2 oz (107.8 kg)   Height:       No intake or output data in the 24 hours ending 02/04/17 0904 Filed Weights   02/02/17 0502 02/03/17 0418 02/04/17 0500  Weight: 234 lb 6.4 oz (106.3 kg) 237 lb 14.4 oz (107.9 kg) 237 lb 11.2 oz (107.8 kg)    Telemetry    Afib with RVR, low 100s to 110s bpm - Personally Reviewed  ECG    n/a - Personally Reviewed  Physical Exam   GEN: Ill appearing. No acute distress.   Neck: No JVD. Cardiac: Mildly tachycardic, no murmurs, rubs, or gallops.  Respiratory: Coarse bilaterally.  GI: Soft, nontender, non-distended.   MS: No edema; No  deformity. Neuro:  Alert; Nonfocal.  Psych: Alert.  Labs    Chemistry Recent Labs Lab 01/31/17 1730  02/02/17 0451 02/03/17 1040 02/04/17 0408  NA 137  < > 138 137 138  K 4.1  < > 3.4* 3.8 3.9  CL 104  < > 113* 106 108  CO2 24  < > 21* 18* 23  GLUCOSE 141*  < > 153* 160* 131*  BUN 25*  < > 35* 42* 38*  CREATININE 1.42*  < > 1.64* 1.67* 1.53*  CALCIUM 8.0*  < > 6.3* 6.6* 6.7*  PROT 6.7  --   --   --   --   ALBUMIN 3.4*  --  2.3*  --   --   AST 31  --   --   --   --   ALT 23  --   --   --   --   ALKPHOS 93  --   --   --   --   BILITOT 1.5*  --   --   --   --   GFRNONAA 43*  < > 37* 36* 40*  GFRAA 50*  < > 42* 41* 46*  ANIONGAP 9  < >  4* 13 7  < > = values in this interval not displayed.   Hematology Recent Labs Lab 01/31/17 1730 02/01/17 0457 02/02/17 0456  WBC 0.6* 0.9* 1.4*  RBC 3.48* 3.28* 3.10*  HGB 11.3* 10.4* 9.8*  HCT 32.5* 30.9* 29.4*  MCV 93.6 94.3 94.9  MCH 32.4 31.6 31.7  MCHC 34.7 33.5 33.4  RDW 16.9* 17.1* 17.4*  PLT 199 151 130*    Cardiac Enzymes Recent Labs Lab 01/31/17 1730  TROPONINI 0.03*   No results for input(s): TROPIPOC in the last 168 hours.   BNPNo results for input(s): BNP, PROBNP in the last 168 hours.   DDimer No results for input(s): DDIMER in the last 168 hours.   Radiology    No results found.  Cardiac Studies   TTE pending  Patient Profile     83 y.o. 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 Posey Pronto, MD.  Assessment & Plan    1. Persistent Afib with RVR/NSVT: -Rates overall improved in the low 100s to 110s bpm currently -Tachycardic rates likely in the setting of acute illness/dehydration with diarrhea,  electrolyte abnormalities, and anemia -Increase Lopressor to 50 mg bid -Cardizem has been stopped in favor of up titration of metoprolol as above -He is not a good candidate for digoxin given CKD -He is not a good candidate for rhythm control at this time given he has not been adequately anticoagulated and will likely not be a candidate for full-dose anticoagulation given his comorbidities including stage IV esophageal cancer, dementia, and anemia  -He would not likely tolerate aggressive measures for rhythm control such as DCCV -He is at increased risk of stroke given his CHADS2VASc of at least 3 (age x 2, DM) -Agree with correction of electrolyte abnormalities  -Recent TSH from 05/2016 normal -No further NSVT  2. HFpEF: -He does not appear volume overloaded at this time -No need to repeat echo at this time given his comorbidiites, this would not change his long term outcome  -Diurese as needed -Not on spironolactone given CKD  3. Stage IV esophageal cancer with metastatic disease to the liver and bones: -Oncology on board -Patient and family are considering no further   4. Weakness/fatigue/diarrhea: -Likely multifactorial including: his stage IV esophageal cancer, poor PO intake, diarrhea with volume depletion, anemia, Afib with RVR, and deconditioning  -Would benefit from palliative care consult to discuss goals of care  5. Neutropenia: -Due to recent chemotherapy  -Oncology on board as above -Neupogen per IM  6. Acute on CKD stage II: -Monitor -Avoid nephrotoxins   7. Hypokalemia/hypomagnesemia: -Replete potassium to goal of > 4.0 and magnesium to goal > 2.0  8. Anemia: -Stable -Maintain HGB > 8.5  For questions or updates, please contact [place] Please consult [email or web address] for contact info under Cardiology/STEMI.    Signed, Christell Faith, PA-C [place] Pager: [phone] 02/04/2017, 9:04 AM  Attending Note Patient seen and examined, agree  with detailed note above,  Patient presentation and plan discussed on rounds.   On my evaluation this morning, he is minimally communicative, sleeping.  Wife at the bedside, long discussion with her concerning goals of care.  She does not know where they are heading,  has a meeting with Dr. Grayland Ormond on Tuesday.  This past month he has had a rapid decline, not eating, not drinking.  She does not know if he can handle additional treatments as scheduled next week.  She is unable to care for him at home as he is weak requiring 2 person assistance with standing.  On physical exam, appears pale, sleeping, minimally communicative, will arouse with stimuli, unable to estimate JVD, heart sounds irregularly irregular, lungs with rales at the bases otherwise clear, abdomen soft nontender, no significant lower extremity edema  Lab work reviewed showing hematocrit 31.6, white count 13 Creatinine 1.5  A/P: 1) atrial fibrillation Continue metoprolol as above In terms of his anticoagulation, he is a high fall risk Also with underlying anemia, stage IV esophageal cancer We will need to talk with oncology concerning anticoagulation Would likely choose for rate control rather than rhythm control  2) stage IV esophageal cancer Managed by Dr. Anthoney Harada discussion with wife at the bedside, rapid decline in the past month, not eating or drinking Son is scheduled to arrive at noon Will strongly consider palliative consult He would likely not receive adequate care in skilled nursing facility, may do better in hospice home  Greater than 50% was spent in counseling and coordination of care with patient Total encounter time 35 minutes or more   Signed: Esmond Plants  M.D., Ph.D. [facility]

## 2017-02-04 NOTE — Progress Notes (Signed)
Pharmacy Electrolyte Monitoring   Pharmacy consulted to replace electrolytes in this 83 y/o M with atrial fibrillation.   Sodium (mmol/L)  Date Value  02/04/2017 138   Potassium (mmol/L)  Date Value  02/04/2017 3.9   Magnesium (mg/dL)  Date Value  02/04/2017 2.1   Calcium (mg/dL)  Date Value  02/04/2017 6.7 (L)   Albumin (g/dL)  Date Value  02/02/2017 2.3 (L)   Goal:  K > 4 Mg > 2  Will replace K with 40 meq KCl x 1 and f/u AM labs.   Ulice Dash, PharmD Clinical Pharmacist

## 2017-02-04 NOTE — Progress Notes (Signed)
PT Cancellation Note  Patient Details Name: Miguel [name]. MRN: [id number] DOB: Sep 01, 1929   Cancelled Treatment:    Reason Eval/Treat Not Completed: Fatigue/lethargy limiting ability to participate; pt agreeable to PT, but kept falling back asleep; difficult to arouse upon multiple attempts. Nursing notified. Plan to re-attempt session as able.   Wetzel Bjornstad, SPT 02/04/2017, 10:19 AM

## 2017-02-04 NOTE — NC FL2 (Signed)
[place] LEVEL OF CARE SCREENING TOOL     IDENTIFICATION  Patient Name: Miguel [name]. Birthdate: 1929/08/27 Sex: male Admission Date (Current Location): 01/31/2017  [place] and [place] Number:  Engineering geologist and Address:  [facility], [address], [place], [place] [address]      Provider Number: [id number]  Attending Physician Name and Address:  Fritzi Mandes, MD  Relative Name and Phone Number:  [place], [place] [phone] or [place], Jent Daughter [phone]     Current Level of Care: Hospital Recommended Level of Care: [place] Prior Approval Number:    Date Approved/Denied:   PASRR Number: [id number]  Discharge Plan: SNF    Current Diagnoses: Patient Active Problem List   Diagnosis Date Noted  . Pressure injury of skin 02/01/2017  . Dehydration 01/31/2017  . Atrial fibrillation with RVR ([place]) 01/31/2017  . Goals of care, counseling/discussion 12/26/2016  . Mobility impaired 11/26/2016  . Chronic pain of right knee 10/28/2016  . (HFpEF) heart failure with preserved ejection fraction ([place]) 07/03/2016  . Atrial fibrillation ([place]) 06/06/2016  . CKD (chronic kidney disease), stage III ([place]) 06/06/2016  . General weakness 06/06/2016  . Right renal mass 05/12/2016  . DM type 2 with diabetic peripheral neuropathy ([place]) 03/18/2016  . COPD (chronic obstructive pulmonary disease) ([place]) 03/18/2016  . Anxiety 03/18/2016  . Dementia 03/18/2016  . GERD without esophagitis 03/18/2016  . Macular degeneration 03/18/2016  . 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 OSA (obstructive sleep apnea) 01/22/2015    Orientation RESPIRATION BLADDER Height & Weight     Self  Normal Incontinent Weight: 237 lb 11.2 oz (107.8 kg) Height:  6\' 1"  (185.4 cm)  BEHAVIORAL SYMPTOMS/MOOD NEUROLOGICAL BOWEL NUTRITION STATUS      Incontinent Diet (Carb Modified)  AMBULATORY STATUS COMMUNICATION OF NEEDS Skin    Limited Assist Verbally PU Stage and Appropriate Care PU Stage 1 Dressing:  (PRN dressing)                     Personal Care Assistance Level of Assistance  Bathing, Feeding, Dressing Bathing Assistance: Limited assistance Feeding assistance: Limited assistance Dressing Assistance: Limited assistance     Functional Limitations Info  Sight, Hearing, Speech Sight Info: Adequate Hearing Info: Adequate Speech Info: Adequate    SPECIAL CARE FACTORS FREQUENCY  PT (By licensed PT)     PT Frequency: 5x a week              Contractures Contractures Info: Not present    Additional Factors Info  Code Status, Allergies, Psychotropic, Insulin Sliding Scale, Isolation Precautions Code Status Info: Full code Allergies Info: NKA Psychotropic Info: donepezil (ARICEPT) tablet 10 mg, and QUEtiapine (SEROQUEL) tablet 50 mg  Insulin Sliding Scale Info: insulin aspart (novoLOG) injection 0-9 Units 3x a day with meals Isolation Precautions Info: Protective precautions     Current Medications (02/04/2017):  This is the current hospital active medication list Current Facility-Administered Medications  Medication Dose Route Frequency Provider Last Rate Last Dose  . acetaminophen (TYLENOL) tablet 650 mg  650 mg Oral Q6H PRN Lance Coon, MD   650 mg at 02/01/17 0419   Or  . acetaminophen (TYLENOL) suppository 650 mg  650 mg Rectal Q6H PRN Lance Coon, MD      . aspirin chewable tablet 81 mg  81 mg Oral Daily Lance Coon, MD   81 mg at 02/04/17 1000  . atorvastatin (LIPITOR) tablet 40 mg  40  mg Oral Daily Lance Coon, MD   40 mg at 02/04/17 0818  . diphenoxylate-atropine (LOMOTIL) 2.5-0.025 MG per tablet 1 tablet  1 tablet Oral Q8H PRN Fritzi Mandes, MD   1 tablet at 02/02/17 1420  . donepezil (ARICEPT) tablet 10 mg  10 mg Oral Corwin Levins, MD   10 mg at 02/03/17 2218  . enoxaparin (LOVENOX) injection 40 mg  40 mg Subcutaneous Q24H Lance Coon, MD   40 mg at 02/03/17 2218  .  feeding supplement (GLUCERNA SHAKE) (GLUCERNA SHAKE) liquid 237 mL  237 mL Oral TID BM Fritzi Mandes, MD   237 mL at 02/04/17 0818  . insulin aspart (novoLOG) injection 0-5 Units  0-5 Units Subcutaneous QHS Lance Coon, MD      . insulin aspart (novoLOG) injection 0-9 Units  0-9 Units Subcutaneous TID WC Lance Coon, MD   1 Units at 02/04/17 [phone]  . megestrol (MEGACE) tablet 40 mg  40 mg Oral Daily Lance Coon, MD   40 mg at 02/04/17 0819  . metoprolol tartrate (LOPRESSOR) injection 5 mg  5 mg Intravenous Once Lance Coon, MD      . metoprolol tartrate (LOPRESSOR) injection 5 mg  5 mg Intravenous Q2H PRN Harrie Foreman, MD   5 mg at 02/03/17 0423  . metoprolol tartrate (LOPRESSOR) tablet 50 mg  50 mg Oral BID Dunn, Ryan M, PA-C      . mometasone-formoterol [facility]) 200-5 MCG/ACT inhaler 2 puff  2 puff Inhalation BID Lance Coon, MD   2 puff at 02/04/17 [phone]  . nystatin (MYCOSTATIN) 100000 UNIT/ML suspension 500,000 Units  5 mL Oral QID Fritzi Mandes, MD   500,000 Units at 02/04/17 0818  . ondansetron (ZOFRAN) tablet 4 mg  4 mg Oral Q6H PRN Lance Coon, MD       Or  . ondansetron [facility]) injection 4 mg  4 mg Intravenous Q6H PRN Lance Coon, MD   4 mg at 02/02/17 1420  . pantoprazole (PROTONIX) EC tablet 40 mg  40 mg Oral BID Lance Coon, MD   40 mg at 02/04/17 0818  . potassium chloride SA (K-DUR,KLOR-CON) CR tablet 40 mEq  40 mEq Oral Once Fritzi Mandes, MD      . QUEtiapine (SEROQUEL) tablet 50 mg  50 mg Oral Corwin Levins, MD   50 mg at 02/03/17 2217  . sodium chloride flush (NS) 0.9 % injection 3 mL  3 mL Intravenous Q12H Fritzi Mandes, MD   3 mL at 02/04/17 0820  . tamsulosin (FLOMAX) capsule 0.4 mg  0.4 mg Oral Daily Lance Coon, MD   0.4 mg at 02/04/17 9518     Discharge Medications: Please see discharge summary for a list of discharge medications.  Relevant Imaging Results:  Relevant Lab Results:   Additional Information SSN [id number]  Ross Ludwig,  [place]

## 2017-02-04 NOTE — Care Management Important Message (Signed)
Important Message  Patient Details  Name: Miguel [name]. MRN: [id number] Date of Birth: Nov 14, 1929   Medicare Important Message Given:  Yes Signed IM notice given    Katrina Stack, RN 02/04/2017, 12:40 PM

## 2017-02-04 NOTE — Consult Note (Signed)
Consultation Note Date: 02/04/2017   Patient Name: Miguel [name].  DOB: February 06, 1930  MRN: [id number]  Age / Sex: 83 y.o., male  PCP: Leone Haven, MD Referring Physician: Fritzi Mandes, MD  Reason for Consultation: Establishing goals of care  HPI/Patient Profile: Miguel [name]  is a 83 y.o. male who presented with weakness, mildly increased confusion, A. fib with RVR. He recieved chemotherapy earlier this week. She states he typically will get somewhat weak after chemotherapy, but this time he has gotten much weaker than normal. He also is more confused than normal. He has had significant diarrhea and is somewhat dehydrated, has had poor by mouth appetite. He presented to the ED in A. fib with RVR, he had neutropenia.   Clinical Assessment and Goals of Care: Miguel [name] is getting up to work with PT. His son is at bedside. Miguel [name] is confused, and per son, has been so since admission. Prior to this admission, he had dementia, but recognized people, had long term memory.   His son states he was a [employer] prior to retiring. 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 He developed dementia several years ago as well as cancer. He states his father was treated and went into remission, and in the past year, the cancer returned to multiple areas. He states is father was hospitalized in the past following chemotherapy, but after a few days bounced back. He recognizes he is not bouncing back this time. He states his father was certain about pursuing treatment this time, and is supposed to have 3 more rounds of chemotherapy, but is worried about continuing treatments with his father's current status. He is concerned that during this hospitalization when he has became more alert, he becomes aggressive.   His son states his father had discussed being a DNR, however, he would like to discuss  this with his mother who will be present in the morning. Family meeting in the morning at 9:00.       NEXT OF KIN  Patient is confused.    SUMMARY OF RECOMMENDATIONS    EKG ordered to check QTC, recommend Risperdal for delirium.   Will meet in the morning at 9:00 with family.   Code Status/Advance Care Planning:  Full code    Symptom Management:   Risperdal for delirium.     Prognosis:   < 6 months Depending on improvement from this hospitalization and further chemo.  Discharge Planning: To Be Determined      Primary Diagnoses: Present on Admission: . (HFpEF) heart failure with preserved ejection fraction ([place]) . CKD (chronic kidney disease), stage III ([place]) . COPD (chronic obstructive pulmonary disease) ([place]) . DM type 2 with diabetic peripheral neuropathy ([place]) . GERD without esophagitis . Atrial fibrillation with RVR ([place]) . 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 The following aspects are pertinent.  Past Medical History:  Diagnosis Date  .  Alcoholism ([place])   . Anxiety   . COPD (chronic obstructive pulmonary disease) ([place])   . Dementia 03/18/2016  . DM type 2 with diabetic peripheral neuropathy ([place]) 03/18/2016  . Dyspnea    slight   . Esophageal cancer ([place])   . Esophageal stricture   . GERD (gastroesophageal reflux disease) 03/18/2016  . Hyperlipidemia   . Kidney carcinoma ([place])    right, sp ablation  . Macular degeneration   . Neuropathy   . Persistent atrial fibrillation (H[facility])    a. not on full dose anticoagulation given comorbidities; b. CHADS2VASc => 3 (age x 2, DM)  . Renal mass    Social History   Social History  . Marital status: Married    Spouse name: N/A  . Number of children: N/A  . Years of education: N/A   Social History Main Topics  . Smoking status: Former Smoker    Types: Cigarettes    Quit date: 87  . Smokeless tobacco:  Never Used  . Alcohol use No     Comment: quit 2012   . Drug use: No  . Sexual activity: Not Currently    Partners: Female   Other Topics Concern  . None   Social History Narrative  . None   Family History  Problem Relation Age of Onset  . Ovarian cancer Sister   . Throat cancer Brother   . Diabetes Mother   . Kidney disease Father   . Prostate cancer Neg Hx   . Kidney cancer Neg Hx    Scheduled Meds: . aspirin  81 mg Oral Daily  . atorvastatin  40 mg Oral Daily  . donepezil  10 mg Oral QHS  . enoxaparin (LOVENOX) injection  40 mg Subcutaneous Q24H  . feeding supplement (GLUCERNA SHAKE)  237 mL Oral TID BM  . insulin aspart  0-5 Units Subcutaneous QHS  . insulin aspart  0-9 Units Subcutaneous TID WC  . megestrol  40 mg Oral Daily  . metoprolol tartrate  5 mg Intravenous Once  . metoprolol tartrate  50 mg Oral BID  . mometasone-formoterol  2 puff Inhalation BID  . nystatin  5 mL Oral QID  . pantoprazole  40 mg Oral BID  . QUEtiapine  50 mg Oral QHS  . sodium chloride flush  3 mL Intravenous Q12H  . tamsulosin  0.4 mg Oral Daily   Continuous Infusions: PRN Meds:.acetaminophen **OR** acetaminophen, diphenoxylate-atropine, metoprolol tartrate, ondansetron **OR** ondansetron (ZOFRAN) IV Medications Prior to Admission:  Prior to Admission medications   Medication Sig Start Date End Date Taking? Authorizing Provider  ADVAIR DISKUS 250-50 MCG/DOSE AEPB Inhale 1 puff into the lungs 2 (two) times daily. 10/27/16  Yes Cook, Jayce G, DO  ALPRAZolam (XANAX) 0.5 MG tablet Take 1 tablet (0.5 mg total) by mouth 3 (three) times daily as needed. for anxiety 01/20/17  Yes Lloyd Huger, MD  aspirin 81 MG chewable tablet Chew 1 tablet (81 mg total) by mouth daily. 06/07/16  Yes Theodoro Grist, MD  atorvastatin (LIPITOR) 40 MG tablet Take 1 tablet (40 mg total) by mouth daily. 03/20/16  Yes Cook, Jayce G, DO  donepezil (ARICEPT) 10 MG tablet TAKE 1 TABLET AT BEDTIME 11/06/16  Yes Cook,  Jayce G, DO  fesoterodine (TOVIAZ) 8 MG TB24 tablet Take 1 tablet (8 mg total) by mouth daily. 08/27/16  Yes Cook, Jayce G, DO  furosemide (LASIX) 20 MG tablet Take 1 tablet (20 mg total) by mouth daily. 12/18/16  Yes Lacinda Axon,  Jayce G, DO  glucose blood test strip Use as instructed 12/18/16  Yes Cook, Jayce G, DO  ketoconazole (NIZORAL) 2 % cream Apply 1 application topically daily. 03/25/16  Yes Thersa Salt G, DO  lidocaine-prilocaine (EMLA) cream Apply to affected area once 12/05/16  Yes Finnegan, Kathlene November, MD  megestrol (MEGACE) 40 MG tablet Take 1 tablet (40 mg total) by mouth daily. 12/23/16  Yes Lloyd Huger, MD  metFORMIN (GLUCOPHAGE) 1000 MG tablet Take 1,000 mg by mouth 2 (two) times daily with a meal.   Yes [provider]  metoprolol tartrate (LOPRESSOR) 25 MG tablet Take 0.5 tablets (12.5 mg total) by mouth 2 (two) times daily. 06/06/16  Yes Theodoro Grist, MD  Multiple Vitamins-Minerals (CENTRUM SILVER PO) Take 1 tablet by mouth daily.   Yes [provider]  omeprazole (PRILOSEC) 20 MG capsule Take 1 capsule (20 mg total) by mouth 2 (two) times daily before a meal. 03/18/16  Yes Cook, Jayce G, DO  ondansetron (ZOFRAN) 8 MG tablet Take 1 tablet (8 mg total) by mouth 2 (two) times daily as needed (Nausea or vomiting). 12/05/16  Yes Lloyd Huger, MD  polyethylene glycol (MIRALAX / GLYCOLAX) packet Take 17 g by mouth daily. 06/07/16  Yes Theodoro Grist, MD  prochlorperazine (COMPAZINE) 10 MG tablet Take 1 tablet (10 mg total) by mouth every 6 (six) hours as needed (Nausea or vomiting). 12/05/16  Yes Lloyd Huger, MD  QUEtiapine (SEROQUEL) 50 MG tablet Take 1 tablet (50 mg total) by mouth at bedtime. 11/06/16  Yes Cook, Jayce G, DO   No Known Allergies Review of Systems  Unable to perform ROS   Physical Exam  Constitutional: No distress.  HENT:  Head: Normocephalic.  Pulmonary/Chest: Effort normal.  Neurological: He is alert.  Confused    Vital Signs: BP  (!) 115/56 (BP Location: Left Arm)   Pulse (!) 104   Temp 97.7 F (36.5 C) (Oral)   Resp 18   Ht 6\' 1"  (1.854 m)   Wt 107.8 kg (237 lb 11.2 oz)   SpO2 91%   BMI 31.36 kg/m  Pain Assessment: No/denies pain POSS *See Group Information*: S-Acceptable,Sleep, easy to arouse Pain Score: 0-No pain   SpO2: SpO2: 91 % O2 Device:SpO2: 91 % O2 Flow Rate: .   IO: Intake/output summary:  Intake/Output Summary (Last 24 hours) at 02/04/17 1458 Last data filed at 02/04/17 1332  Gross per 24 hour  Intake              250 ml  Output                0 ml  Net              250 ml    LBM: Last BM Date: 02/02/17 Baseline Weight: Weight: 108.9 kg (240 lb) Most recent weight: Weight: 107.8 kg (237 lb 11.2 oz)     Palliative Assessment/Data:30%     Time In: 2:00pm  Time Out: 3:00pm Time Total: 1 hour Greater than 50%  of this time was spent counseling and coordinating care related to the above assessment and plan.  Signed by: Asencion Gowda, NP 02/04/2017 3:29 PM Office: [phone] 7am-7pm  Pager: [phone] Call primary team after hours   Please contact Palliative Medicine Team phone at [phone] for questions and concerns.  For individual provider: See Shea Evans

## 2017-02-04 NOTE — Progress Notes (Signed)
Spoke with pt's son and discussed about having Palliative care see pt here in the hospital Consult placed

## 2017-02-04 NOTE — Clinical Social Work Note (Signed)
Clinical Social Work Assessment  Patient Details  Name: Miguel [name]. MRN: [id number] Date of Birth: 03-Mar-1930  Date of referral:  02/04/17               Reason for consult:  Facility Placement                Permission sought to share information with:  Family Supports Permission granted to share information::  Yes, Verbal Permission Granted  Name::     [name], [name] [phone]   Agency::  SNF admissions  Relationship::     Contact Information:     Housing/Transportation Living arrangements for the past 2 months:  [place] of Information:  Adult Children Patient Interpreter Needed:  None Criminal Activity/Legal Involvement Pertinent to Current Situation/Hospitalization:  No - Comment as needed Significant Relationships:  Adult Children, Spouse Lives with:  Spouse Do you feel safe going back to the place where you live?  No Need for family participation in patient care:  Yes (Comment)  Care giving concerns:  Patient's son feels patient needs some short term rehab before he is able to return back home.   Social Worker assessment / plan:  Patient is an 83 year old male who is married and lives with his wife at [place].  Patient has dementia and is only alert and oriented x1.  CSW spoke to patient's son to complete assessment.  Patient's son was explained process of looking for placement and how insurance will pay for stay.  CSW explained that because patient has history of cancer it is going to limit options of SNFs available.  CSW explained what to expect at SNF, and what will happen once patient is ready for discharge from hospital.  Patient's son gave CSW permission to begin bed search process in [place].  Patient's son did not express any other questions or concerns.  Employment status:  Retired Forensic scientist:  Medicare PT Recommendations:  [place] / Referral to community  resources:  [place]  Patient/Family's Response to care:  Patient's son is agreeable to having patient go to SNF for short term rehab.  Patient/Family's Understanding of and Emotional Response to Diagnosis, Current Treatment, and Prognosis:  Patient's son is aware that patient has poor prognosis, but would like to try to pursue rehab.  Emotional Assessment Appearance:  Appears stated age Attitude/Demeanor/Rapport:    Affect (typically observed):  Appropriate, Calm Orientation:  Oriented to Self Alcohol / Substance use:  Not Applicable Psych involvement (Current and /or in the community):  No (Comment)  Discharge Needs  Concerns to be addressed:  Lack of Support Readmission within the last 30 days:  No Current discharge risk:  Lack of support system Barriers to Discharge:  Continued Medical Work up   Anell Barr 02/04/2017, 5:20 PM

## 2017-02-04 NOTE — Progress Notes (Signed)
Physical Therapy Treatment Patient Details Name: Miguel [name]. MRN: [id number] DOB: [birth year] Today's Date: 02/04/2017    History of Present Illness Pt is an 83 y.o. 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 Pt recently received chemotherapy (01/26/17) and usually experiences slight SOB and generalized weakness following treatment. Pt presents to ED 01/31/17 with SOB, diarrhea, confusion and worsening weakness over the past 4-5 days secondary to chemotherapy-induced neutropenia. CT of head confirms atrophy with chronic appearing small vessel ischemic disease; no other acute abnormalties noted. Chest imaging confirms small L pleural effusion.    PT Comments    Pt's session limited secondary to significant lethargy, but pt still participatory during session. Pt required frequent verbal and tactile cuing to stay awake; pt kept eyes closed for majority of session. Pt is max assist for supine to sit; max assist x2 for sit to supine. Max assist for transition from sitting into half standing position; unable to achieve full standing despite heavy vc's for sequencing throughout task. D/t patient's regression from goals, pt does not appear safe to d/c home at this time. PT recommends d/c to SNF to address problem list as noted below.  Follow Up Recommendations  SNF     Equipment Recommendations  Rolling walker with 5" wheels    Recommendations for Other Services       Precautions / Restrictions Precautions Precautions: Fall;Other (comment) Precaution Comments: protective precautions Restrictions Weight Bearing Restrictions: No    Mobility  Bed Mobility Overal bed mobility: Needs Assistance Bed Mobility: Supine to Sit;Sit to Supine     Supine to sit: Max assist;HOB elevated Sit to supine: Max assist;+2 for physical assistance   General bed mobility  comments: Max assist for supine to sit, max assist x2 for sit to supine; heavy vc's for sequencing, but not successful  Transfers Overall transfer level: Needs assistance Equipment used: Rolling walker (2 wheeled) Transfers: Sit to/from Stand Sit to Stand: Max assist         General transfer comment: max assist x2 trials with max vc's for sequencing; first trial unsuccessful to achieve elevation from bed, second trial able to rise into half-standing before sitting back down; increased time and effort to perform  Ambulation/Gait                 Stairs            Wheelchair Mobility    Modified Rankin (Stroke Patients Only)       Balance Overall balance assessment: Needs assistance Sitting-balance support: Single extremity supported;Feet supported Sitting balance-Leahy Scale: Poor Sitting balance - Comments: requires UUE support and min-mod assist to remain seated EOB Postural control: Right lateral lean Standing balance support: Bilateral upper extremity supported Standing balance-Leahy Scale: Poor Standing balance comment: BUE support on RW in half-standing position                            Cognition Arousal/Alertness: Lethargic Behavior During Therapy: Flat affect Overall Cognitive Status: Impaired/Different from baseline                                 General Comments: Pt very lethargic and difficult to arouse with verbal and tactile cues; speech inarticulate and difficult to understand; pt kept eyes closed for majority of session, opened eyes with  vc's but did not keep eyes open      Exercises      General Comments        Pertinent Vitals/Pain Pain Assessment: No/denies pain    Home Living                      Prior Function            PT Goals (current goals can now be found in the care plan section) Acute Rehab PT Goals Patient Stated Goal: to go home PT Goal Formulation: With patient Time For Goal  Achievement: 02/16/17 Potential to Achieve Goals: Fair Progress towards PT goals: Not progressing toward goals - comment    Frequency    Min 2X/week      PT Plan Discharge plan needs to be updated    Co-evaluation              AM-PAC PT "6 Clicks" Daily Activity  Outcome Measure  Difficulty turning over in bed (including adjusting bedclothes, sheets and blankets)?: Unable Difficulty moving from lying on back to sitting on the side of the bed? : Unable Difficulty sitting down on and standing up from a chair with arms (e.g., wheelchair, bedside commode, etc,.)?: Unable Help needed moving to and from a bed to chair (including a wheelchair)?: A Lot Help needed walking in hospital room?: A Lot Help needed climbing 3-5 steps with a railing? : Total 6 Click Score: 8    End of Session Equipment Utilized During Treatment: Gait belt Activity Tolerance: Patient limited by lethargy Patient left: in bed;with call bell/phone within reach;with bed alarm set;with family/visitor present Nurse Communication: Mobility status PT Visit Diagnosis: Unsteadiness on feet (R26.81);Muscle weakness (generalized) (M62.81);History of falling (Z91.81);Other abnormalities of gait and mobility (R26.89)     Time: 0786-7544 PT Time Calculation (min) (ACUTE ONLY): 20 min  Charges:                       G CodesWetzel Bjornstad, SPT 02/04/2017, 2:42 PM

## 2017-02-04 NOTE — Progress Notes (Signed)
[place] [place] [place] NAME: Miguel [name]    MR#:  [id number]  DATE OF BIRTH:  1929-09-06  SUBJECTIVE:   Patient appears to be improving.  Sleepy this am. Wife in the room. HR in the 100's REVIEW OF SYSTEMS:   Review of Systems  Constitutional: Negative for chills, fever and weight loss.  HENT: Positive for sore throat. Negative for ear discharge, ear pain and nosebleeds.   Eyes: Negative for blurred vision, pain and discharge.  Respiratory: Negative for sputum production, shortness of breath, wheezing and stridor.   Cardiovascular: Negative for chest pain, palpitations, orthopnea and PND.  Gastrointestinal: Positive for diarrhea and nausea. Negative for abdominal pain and vomiting.  Genitourinary: Negative for frequency and urgency.  Musculoskeletal: Negative for back pain and joint pain.  Neurological: Positive for weakness. Negative for sensory change, speech change and focal weakness.  Psychiatric/Behavioral: Negative for depression and hallucinations. The patient is not nervous/anxious.    Tolerating Diet:very little Tolerating PT: recommend HHPT but wife would like re-eval  DRUG ALLERGIES:  No Known Allergies  VITALS:  Blood pressure (!) 115/56, pulse (!) 113, temperature 97.7 F (36.5 C), temperature source Oral, resp. rate 18, height 6\' 1"  (1.854 m), weight 107.8 kg (237 lb 11.2 oz), SpO2 91 %.  PHYSICAL EXAMINATION:   Physical Exam  GENERAL:  83 y.o.-year-old patient lying in the bed with no acute distress. Weak, ill appearing EYES: Pupils equal, round, reactive to light and accommodation. No scleral icterus. Extraocular muscles intact.  HEENT: Head atraumatic, normocephalic, oral thrush NECK:  Supple, no jugular venous distention. No thyroid enlargement, no tenderness.  LUNGS: Normal breath sounds bilaterally, no wheezing, rales, rhonchi. No use of accessory muscles of respiration.  CARDIOVASCULAR: S1, S2 normal. No  murmurs, rubs, or gallops.  ABDOMEN: Soft, nontender, nondistended. Bowel sounds present. No organomegaly or mass.  EXTREMITIES: No cyanosis, clubbing or edema b/l.    NEUROLOGIC: Cranial nerves II through XII are intact. No focal Motor or sensory deficits b/l.   PSYCHIATRIC:  patient is alert and but sleepy  SKIN: No obvious rash, lesion, or ulcer.   LABORATORY PANEL:  CBC  Recent Labs Lab 02/04/17 0925  WBC 13.2*  HGB 10.4*  HCT 31.6*  PLT 183    Chemistries   Recent Labs Lab 01/31/17 1730  02/04/17 0408  NA 137  < > 138  K 4.1  < > 3.9  CL 104  < > 108  CO2 24  < > 23  GLUCOSE 141*  < > 131*  BUN 25*  < > 38*  CREATININE 1.42*  < > 1.53*  CALCIUM 8.0*  < > 6.7*  MG  --   < > 2.1  AST 31  --   --   ALT 23  --   --   ALKPHOS 93  --   --   BILITOT 1.5*  --   --   < > = values in this interval not displayed. Cardiac Enzymes  Recent Labs Lab 01/31/17 1730  TROPONINI 0.03*   RADIOLOGY:  No results found. ASSESSMENT AND PLAN:   Miguel [name]  is a 83 y.o. male who presents with weakness, mildly increased confusion, A. fib with RVR. Patient's history is given mostly by his wife is a patient is unable to contribute very clearly to his own history of present illness. Wife states that he got chemotherapy earlier this week. She states he typically will get somewhat weak after  chemotherapy, but this time he has gotten much weaker than normal. He also is more confused than normal  *General weakness - likely due to deconditioning, his chemotherapy, profound dehydration, poor by mouth intake.  Received IVF Encourage oral glucerna/ensure  *  Atrial fibrillation with RVR (HCC)  -metoprolol 25 mg tid--changed to toprol XL 50 mg bid -was ion Cardizem 30 mg every 6 hourly--d/ced - cardiology consultation appreciated -Echo 05/2016 showed EF 55% - no anticoagulation given falls at home in the past  *Neutropenia due to chemo for Stage IV Squamous cell esophageal cancer [facility])   - oncology consult appreciated -pt is neutropenic due to recent chemo -no fever -start Neupogen 300 mcg qd -wbc 0.6--0.9--Neupogen--1.4--neupogen-- 13.4. Hold neupogen  *Diarrhea--resolved -C diff and GI PCR negative -?chemo related  *  DM type 2 with diabetic peripheral neuropathy (HCC) - siding scale insulin with corresponding glucose checks  *  COPD (chronic obstructive pulmonary disease) ([place]) - continue home meds  *  GERD without esophagitis - home dose PPI  * Oral thrush nystatin S and S  *  CKD (chronic kidney disease), stage III (HCC) - avoid nephrotoxins  PT  Recommends HHPT--pt appears weak with have PT reassess him. Wife requesting it also. CM/CSW for d/c planning.  Spoke with wife and dr Grayland Ormond  Case discussed with Care Management/Social Worker. Management plans discussed with the patient, family and they are in agreement.  CODE STATUS: full  DVT Prophylaxis: scd  TOTAL TIME TAKING CARE OF THIS PATIENT: *30* minutes.  >50% time spent on counselling and coordination of care     Note: This dictation was prepared with Dragon dictation along with smaller phrase technology. Any transcriptional errors that result from this process are unintentional.  Anjalee Cope M.D on 02/04/2017 at 11:35 AM  Between 7am to 6pm - Pager - [phone]  After 6pm go to [email or web address] - password [email or web address] [facility]  Office  [phone]  CC: Primary care physician; Leone Haven, MD

## 2017-02-04 NOTE — Progress Notes (Signed)
Son and wife requesting that pt become a DNR/ Dr. Posey Pronto paged to make aware/ Danae Chen, RN witness to family request

## 2017-02-05 LAB — CULTURE, BLOOD (ROUTINE X 2)
Culture: NO GROWTH
Culture: NO GROWTH
Special Requests: ADEQUATE
Special Requests: ADEQUATE

## 2017-02-05 LAB — BASIC METABOLIC PANEL
Anion gap: 7 (ref 5–15)
BUN: 32 mg/dL — ABNORMAL HIGH (ref 6–20)
CO2: 25 mmol/L (ref 22–32)
Calcium: 6.6 mg/dL — ABNORMAL LOW (ref 8.9–10.3)
Chloride: 109 mmol/L (ref 101–111)
Creatinine, Ser: 1.46 mg/dL — ABNORMAL HIGH (ref 0.61–1.24)
GFR calc Af Amer: 49 mL/min — ABNORMAL LOW (ref >60)
GFR calc non Af Amer: 42 mL/min — ABNORMAL LOW (ref >60)
Glucose, Bld: 129 mg/dL — ABNORMAL HIGH (ref 65–99)
Potassium: 4.4 mmol/L (ref 3.5–5.1)
Sodium: 141 mmol/L (ref 135–145)

## 2017-02-05 LAB — GLUCOSE, CAPILLARY
GLUCOSE-CAPILLARY: 181 mg/dL — AB (ref 65–99)
Glucose-Capillary: 136 mg/dL — ABNORMAL HIGH (ref 65–99)

## 2017-02-05 LAB — MAGNESIUM: Magnesium: 2.2 mg/dL (ref 1.7–2.4)

## 2017-02-05 MED ORDER — FENTANYL CITRATE (PF) 100 MCG/2ML IJ SOLN
12.5000 ug | INTRAMUSCULAR | Status: DC | PRN
  Administered 2017-02-06: 22:00:00 12.5 ug via INTRAVENOUS
  Administered 2017-02-07 (×2): 25 ug via INTRAVENOUS
  Filled 2017-02-05 (×3): qty 2

## 2017-02-05 MED ORDER — FENTANYL CITRATE (PF) 100 MCG/2ML IJ SOLN
12.5000 ug | Freq: Once | INTRAMUSCULAR | Status: AC
  Administered 2017-02-05: 12.5 ug via INTRAVENOUS
  Filled 2017-02-05: qty 2

## 2017-02-05 NOTE — Progress Notes (Signed)
New hospice home referral received from [facility] following a Palliative Medicine consult. Hospital care team all made aware that there is currently no bed availability. Writer spoke in the room with patient's son Ulice Dash and daughter in law Baxter Flattery, they are aware of current wait list and re familiar with hospice services. Family appreciative of visit, contact number left with Baxter Flattery.Patient information faxed to referral.  Will continue to follow and update regarding availability. Thank you. Flo Shanks RN, BSN, [place] [place], [facility] [phone] c

## 2017-02-05 NOTE — Progress Notes (Signed)
Chris,RN  from 1C notified pt has 2 left pressure ulcers (1x1/2 cm) on medial sacrum, and 1x1 cm on Right buttock. Family stated these were present prior to admission.

## 2017-02-05 NOTE — Progress Notes (Signed)
Report called to [place] on 1C/ verbalized an understanding/ will transfer pt in bed.

## 2017-02-05 NOTE — Progress Notes (Signed)
Physical Therapy Treatment Patient Details Name: Miguel [name]. MRN: [id number] DOB: 10/27/1929 Today's Date: 02/05/2017    History of Present Illness Pt is an 83 y.o. 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 Pt recently received chemotherapy (01/26/17) and usually experiences slight SOB and generalized weakness following treatment. Pt presents to ED 01/31/17 with SOB, diarrhea, confusion and worsening weakness over the past 4-5 days secondary to chemotherapy-induced neutropenia. CT of head confirms atrophy with chronic appearing small vessel ischemic disease; no other acute abnormalties noted. Chest imaging confirms small L pleural effusion.    PT Comments    Pt lethargic but agrees to session.  Supine to sit on edge of bed with mod a x 2.  Pt attempting to assist today.  Once sitting, able to maintain balance with supervision.  He was able to stand fully x 2 for short periods of time with mod a x 2.  On second attempt he was able to take 2 very small sidesteps along bed with mod a x 2 but quickly sat back on bed.  Returned to supine with max a x 2.    HR monitored during session.  After standing HR increased to 130's but returned to 110's with rest.  HR did fluctuate during session.  Family stated pt has A-fib and is "normal" for pt.    Follow Up Recommendations  SNF     Equipment Recommendations  Rolling walker with 5" wheels    Recommendations for Other Services       Precautions / Restrictions Precautions Precautions: Fall;Other (comment) Precaution Comments: protective precautions Restrictions Weight Bearing Restrictions: No Other Position/Activity Restrictions: watch HR    Mobility  Bed Mobility Overal bed mobility: Needs Assistance Bed Mobility: Supine to Sit;Sit to Supine     Supine to sit: +2 for physical assistance;Mod assist Sit to  supine: Max assist;+2 for physical assistance   General bed mobility comments: Some improvement supine to sit today  Transfers Overall transfer level: Needs assistance Equipment used: Rolling walker (2 wheeled) Transfers: Sit to/from Stand Sit to Stand: Mod assist;+2 physical assistance         General transfer comment: Able to stand fully x 2 today  Ambulation/Gait Ambulation/Gait assistance: Mod assist;+2 physical assistance Ambulation Distance (Feet): 1 Feet Assistive device: Rolling walker (2 wheeled) Gait Pattern/deviations: Step-to pattern   Gait velocity interpretation: <1.8 ft/sec, indicative of risk for recurrent falls General Gait Details: sidestepped 2 small steps along bed.   Stairs            Wheelchair Mobility    Modified Rankin (Stroke Patients Only)       Balance Overall balance assessment: Needs assistance Sitting-balance support: Feet supported Sitting balance-Leahy Scale: Fair     Standing balance support: Bilateral upper extremity supported Standing balance-Leahy Scale: Poor                              Cognition Arousal/Alertness: Lethargic Behavior During Therapy: WFL for tasks assessed/performed Overall Cognitive Status: Impaired/Different from baseline                                        Exercises      General Comments        Pertinent Vitals/Pain Pain Assessment:  No/denies pain    Home Living                      Prior Function            PT Goals (current goals can now be found in the care plan section) Progress towards PT goals: Progressing toward goals    Frequency    Min 2X/week      PT Plan Current plan remains appropriate    Co-evaluation              AM-PAC PT "6 Clicks" Daily Activity  Outcome Measure  Difficulty turning over in bed (including adjusting bedclothes, sheets and blankets)?: Unable Difficulty moving from lying on back to sitting on the  side of the bed? : Unable Difficulty sitting down on and standing up from a chair with arms (e.g., wheelchair, bedside commode, etc,.)?: Unable Help needed moving to and from a bed to chair (including a wheelchair)?: A Lot Help needed walking in hospital room?: A Lot Help needed climbing 3-5 steps with a railing? : Total 6 Click Score: 8    End of Session Equipment Utilized During Treatment: Gait belt Activity Tolerance: Patient limited by fatigue Patient left: in bed;with bed alarm set;with call bell/phone within reach;with family/visitor present;with nursing/sitter in room         Time: 2595-6387 PT Time Calculation (min) (ACUTE ONLY): 15 min  Charges:  $Therapeutic Activity: 8-22 mins                    G Codes:       Chesley Noon, PTA 02/05/17, 9:17 AM

## 2017-02-05 NOTE — Clinical Social Work Note (Signed)
CSW informed by RN CM that patient is now a hospice home referral and to be made comfort measures.  Shela Leff MSW,LCSW [phone]

## 2017-02-05 NOTE — Care Management (Signed)
Discharge disposition changed to comfort care and hospice home placement when bed becomes available.  Miguel [name] is agency preference

## 2017-02-05 NOTE — Progress Notes (Signed)
Pharmacy Electrolyte Monitoring   Pharmacy consulted to replace electrolytes in this 83 y/o M with atrial fibrillation.   Sodium (mmol/L)  Date Value  02/05/2017 141   Potassium (mmol/L)  Date Value  02/05/2017 4.4   Magnesium (mg/dL)  Date Value  02/05/2017 2.2   Calcium (mg/dL)  Date Value  02/05/2017 6.6 (L)   Albumin (g/dL)  Date Value  02/02/2017 2.3 (L)   Goal:  K > 4 Mg > 2  Electrolytes remain WNL. Next labs Saturday unless otherwise ordered by MD.   Ulice Dash, PharmD Clinical Pharmacist

## 2017-02-05 NOTE — Progress Notes (Signed)
Daily Progress Note   Patient Name: Miguel [name].       Date: 02/05/2017 DOB: Jan 17, 1930  Age: 83 y.o. MRN#: [id number] Attending Physician: Fritzi Mandes, MD Primary Care Physician: Leone Haven, MD Admit Date: 01/31/2017  Reason for Consultation/Follow-up: Establishing goals of care  Subjective: Patient is resting in bed. Confused. Asking for water. His wife and son states he has been asking for people to come and say good bye to him because he thinks he is dying.  Moist cough after drinking water. Wife and son at bedside. His wife Miguel [name] states she has noticed cognitive decline over the past few weeks (he has dementia at baseline) and approximately a 25 pound weight loss in the last moth. They state he has not eaten more than a few bites since he has been here, and is only drinking small amounts. 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 Miguel [name] is worried about providing medications to improve alertness as he becomes combative due to his current state. He has attempted PT and at best today took 2 side steps with a HR increasing to the 130's, and sat back down.  Family would like comfort care. MOST form filled out.     Length of Stay: 5  Current Medications: Scheduled Meds:  . aspirin  81 mg Oral Daily  . atorvastatin  40 mg Oral Daily  . donepezil  10 mg Oral QHS  . enoxaparin (LOVENOX) injection  40 mg Subcutaneous Q24H  . feeding supplement (GLUCERNA SHAKE)  237 mL Oral TID BM  . insulin aspart  0-5 Units Subcutaneous QHS  . insulin aspart  0-9 Units Subcutaneous TID WC  . megestrol  40 mg Oral Daily    . metoprolol tartrate  5 mg Intravenous Once  . metoprolol tartrate  50 mg Oral BID  . mometasone-formoterol  2 puff Inhalation BID  . nystatin  5 mL Oral QID  . pantoprazole  40 mg Oral BID  . QUEtiapine  50 mg Oral QHS  . sodium chloride flush  3 mL Intravenous Q12H  . tamsulosin  0.4 mg Oral Daily    Continuous Infusions:   PRN  Meds: acetaminophen **OR** acetaminophen, diphenoxylate-atropine, metoprolol tartrate, ondansetron **OR** ondansetron (ZOFRAN) IV  Physical Exam  Constitutional: No distress.  HENT:  Head: Atraumatic.  Neurological:  Alert at times and awakening briefly asking for water, taking a few sips, then going back to sleep.             Vital Signs: BP 115/64 (BP Location: Right Arm)   Pulse (!) 117   Temp 97.6 F (36.4 C) (Oral)   Resp 18   Ht 6\' 1"  (1.854 m)   Wt 107.4 kg (236 lb 11.2 oz)   SpO2 95%   BMI 31.23 kg/m  SpO2: SpO2: 95 % O2 Device: O2 Device: Not Delivered O2 Flow Rate:    Intake/output summary:  Intake/Output Summary (Last 24 hours) at 02/05/17 0948 Last data filed at 02/04/17 1700  Gross per 24 hour  Intake              250 ml  Output                0 ml  Net              250 ml   LBM: Last BM Date: 02/02/17 Baseline Weight: Weight: 108.9 kg (240 lb) Most recent weight: Weight: 107.4 kg (236 lb 11.2 oz)       Palliative Assessment/Data: 20%      Patient Active Problem List   Diagnosis Date Noted  . Pressure injury of skin 02/01/2017  . Dehydration 01/31/2017  . Atrial fibrillation with RVR ([place]) 01/31/2017  . Goals of care, counseling/discussion 12/26/2016  . Mobility impaired 11/26/2016  . Chronic pain of right knee 10/28/2016  . (HFpEF) heart failure with preserved ejection fraction ([place]) 07/03/2016  . Atrial fibrillation ([place]) 06/06/2016  . CKD (chronic kidney disease), stage III ([place]) 06/06/2016  . General weakness 06/06/2016  . Right renal mass 05/12/2016  . DM type 2 with diabetic peripheral neuropathy  ([place]) 03/18/2016  . COPD (chronic obstructive pulmonary disease) ([place]) 03/18/2016  . Anxiety 03/18/2016  . Dementia 03/18/2016  . GERD without esophagitis 03/18/2016  . Macular degeneration 03/18/2016  . 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 OSA (obstructive sleep apnea) 01/22/2015    Palliative Care Assessment & Plan   Patient Profile: Miguel [name] a 83 y.o.malewho presented with weakness, mildly increased confusion, A. fib with RVR. He recieved chemotherapy earlier this week. She states he typically will get somewhat weak after chemotherapy, but this time he has gotten much weaker than normal. He also is more confused than normal. He has had significant diarrhea and is somewhat dehydrated, has had poor by mouth appetite. He presented to the ED in A. fib with RVR, he had neutropenia.    Assessment:  Miguel [name] is resting in bed. He is weak, frail, and confused.    Recommendations/Plan:  Hospice facility  Fentanyl ordered for pain.  Goals of Care and Additional Recommendations:  Limitations on Scope of Treatment: Full Comfort Care  Code Status:    Code Status Orders        Start     Ordered   02/04/17 1738  Do not attempt resuscitation (DNR)  Continuous    Question Answer Comment  In the event of cardiac or respiratory ARREST Do not call a "code blue"   In the event of cardiac or respiratory ARREST Do not perform Intubation, CPR, defibrillation or ACLS   In the event of cardiac or respiratory ARREST Use medication by any route, position, wound  care, and other measures to relive pain and suffering. May use oxygen, suction and manual treatment of airway obstruction as needed for comfort.      02/04/17 1738    Code Status History    Date Active Date Inactive Code Status Order ID Comments User Context   01/31/2017 10:54 PM 02/04/2017  5:38 PM Full Code [id number]  Lance Coon, MD Inpatient   06/03/2016  3:33 PM 06/06/2016  6:46 PM Full Code [id number]   Dustin Flock, MD Inpatient       Prognosis:   < 2 weeks failure to thrive. Confusion. Only eating and drinking bites and sips. Per family approx 25 poind weighjt loss in 1 month.  Stage 4 esophageal cancer with metastasis. A fib with RVR, dehydration, CKD, decreased albumin.  Discharge Planning:  Hospice facility  Care plan was discussed with Dr. Posey Pronto.   Thank you for allowing the Palliative Medicine Team to assist in the care of this patient.   Time In: 9:00 Time Out: 10:30 Total Time 1.5 hours Prolonged Time Billed  Yes      Greater than 50%  of this time was spent counseling and coordinating care related to the above assessment and plan.  Asencion Gowda, NP  Please contact Palliative Medicine Team phone at [phone] for questions and concerns.

## 2017-02-05 NOTE — Progress Notes (Signed)
[place] at [place] NAME: Miguel [name]    MR#:  [id number]  DATE OF BIRTH:  14-Feb-1930  SUBJECTIVE:   Very sleepy Trying to drink some ensure Wife and son in the room REVIEW OF SYSTEMS:   Review of Systems  Unable to perform ROS: Mental status change   Tolerating Diet:very little Tolerating PT: recommend HHPT but wife would like re-eval  DRUG ALLERGIES:  No Known Allergies  VITALS:  Blood pressure 115/64, pulse (!) 117, temperature 97.6 F (36.4 C), temperature source Oral, resp. rate 18, height 6' 1"  (1.854 m), weight 107.4 kg (236 lb 11.2 oz), SpO2 95 %.  PHYSICAL EXAMINATION:   Physical Exam  GENERAL:  83 y.o.-year-old patient lying in the bed with no acute distress. Weak, ill appearing EYES: Pupils equal, round, reactive to light and accommodation. No scleral icterus. Extraocular muscles intact.  HEENT: Head atraumatic, normocephalic, oral thrush NECK:  Supple, no jugular venous distention. No thyroid enlargement, no tenderness.  LUNGS: Normal breath sounds bilaterally, no wheezing, rales, rhonchi. No use of accessory muscles of respiration.  CARDIOVASCULAR: S1, S2 normal. No murmurs, rubs, or gallops.  ABDOMEN: Soft, nontender, nondistended. Bowel sounds present. No organomegaly or mass.  EXTREMITIES: No cyanosis, clubbing or edema b/l.    NEUROLOGIC: unable to assess  PSYCHIATRIC:  patient is sleepy    LABORATORY PANEL:  CBC  Recent Labs Lab 02/04/17 0925  WBC 13.2*  HGB 10.4*  HCT 31.6*  PLT 183    Chemistries   Recent Labs Lab 01/31/17 1730  02/05/17 0423  NA 137  < > 141  K 4.1  < > 4.4  CL 104  < > 109  CO2 24  < > 25  GLUCOSE 141*  < > 129*  BUN 25*  < > 32*  CREATININE 1.42*  < > 1.46*  CALCIUM 8.0*  < > 6.6*  MG  --   < > 2.2  AST 31  --   --   ALT 23  --   --   ALKPHOS 93  --   --   BILITOT 1.5*  --   --   < > = values in this interval not displayed. Cardiac Enzymes  Recent  Labs Lab 01/31/17 1730  TROPONINI 0.03*   RADIOLOGY:  No results found. ASSESSMENT AND PLAN:   Miguel [name]  is a 83 y.o. male who presents with weakness, mildly increased confusion, A. fib with RVR. Patient's history is given mostly by his wife is a patient is unable to contribute very clearly to his own history of present illness. Wife states that he got chemotherapy earlier this week. She states he typically will get somewhat weak after chemotherapy, but this time he has gotten much weaker than normal. He also is more confused than normal  *General weakness - likely due to deconditioning, his chemotherapy, profound dehydration, poor by mouth intake.  Received IVF  *  Atrial fibrillation with RVR (HCC)  -metoprolol 25 mg tid--changed to toprol XL 50 mg bid --Echo 05/2016 showed EF 55% - no anticoagulation given falls at home in the past  *Neutropenia due to chemo for Stage IV Squamous cell esophageal cancer [facility])  - oncology consult appreciated -pt is neutropenic due to recent chemo -no fever -start Neupogen 300 mcg qd -wbc 0.6--0.9--Neupogen--1.4--neupogen-- 13.4. Hold neupogen  *Diarrhea--resolved -C diff and GI PCR negative -?chemo related  *  DM type 2 with diabetic peripheral neuropathy ([place]) - siding  scale insulin with corresponding glucose checks  *  COPD (chronic obstructive pulmonary disease) (HCC)  *  CKD (chronic kidney disease), stage III ([place]) - avoid nephrotoxins  Wife and son met with Palliative care and pt is now COMFORT MEASURES ONLY   CODE STATUS: DNR   TOTAL TIME TAKING CARE OF THIS PATIENT: *20* minutes.  >50% time spent on counselling and coordination of care     Note: This dictation was prepared with Dragon dictation along with smaller phrase technology. Any transcriptional errors that result from this process are unintentional.  Miguel [name] M.D on 02/05/2017 at 1:01 PM  Between 7am to 6pm - Pager - [phone]  After 6pm go to [email or web address]  - password [email or web address] [facility]  Office  [phone]  CC: Primary care physician; Leone Haven, MD

## 2017-02-06 ENCOUNTER — Telehealth: Payer: Self-pay | Admitting: *Deleted

## 2017-02-06 MED ORDER — SALINE SPRAY 0.65 % NA SOLN
1.0000 | NASAL | Status: DC | PRN
  Administered 2017-02-06: 06:00:00 1 via NASAL
  Filled 2017-02-06: qty 44

## 2017-02-06 MED ORDER — ALPRAZOLAM 0.5 MG PO TABS
0.5000 mg | ORAL_TABLET | Freq: Three times a day (TID) | ORAL | Status: DC | PRN
  Administered 2017-02-06: 0.5 mg via ORAL
  Filled 2017-02-06: qty 1

## 2017-02-06 NOTE — Telephone Encounter (Signed)
Given that he is on hospice is he no longer taking this medication? Thanks.

## 2017-02-06 NOTE — Progress Notes (Signed)
[place] at [address] NAME: Miguel [name]    MR#:  [id number]  DATE OF BIRTH:  [birth year]  SUBJECTIVE:   Very sleepy Trying to drink some ensure Wife in the room, confused REVIEW OF SYSTEMS:   Review of Systems  Unable to perform ROS: Mental status change   Tolerating Diet:very little   DRUG ALLERGIES:  No Known Allergies  VITALS:  Blood pressure 102/61, pulse 73, temperature 97.6 F (36.4 C), resp. rate 18, height _0  (1.854 m), weight 107.4 kg (236 lb 11.2 oz), SpO2 99 %.  PHYSICAL EXAMINATION:   Physical Exam  GENERAL:  83 y.o.-year-old patient lying in the bed with no acute distress. Weak, ill appearing EYES: Pupils equal, round, reactive to light and accommodation. No scleral icterus. Extraocular muscles intact.  HEENT: Head atraumatic, normocephalic, oral thrush NECK:  Supple, no jugular venous distention. No thyroid enlargement, no tenderness.  LUNGS: Normal breath sounds bilaterally, no wheezing, rales, rhonchi. No use of accessory muscles of respiration.  CARDIOVASCULAR: S1, S2 normal. No murmurs, rubs, or gallops.  ABDOMEN: Soft, nontender, nondistended. Bowel sounds present. No organomegaly or mass.  EXTREMITIES: No cyanosis, clubbing or edema b/l.    NEUROLOGIC: unable to assess  PSYCHIATRIC:  patient is sleepy    LABORATORY PANEL:  CBC  Recent Labs Lab 02/04/17 0925  WBC 13.2*  HGB 10.4*  HCT 31.6*  PLT 183    Chemistries   Recent Labs Lab 01/31/17 1730  02/05/17 0423  NA 137  < > 141  K 4.1  < > 4.4  CL 104  < > 109  CO2 24  < > 25  GLUCOSE 141*  < > 129*  BUN 25*  < > 32*  CREATININE 1.42*  < > 1.46*  CALCIUM 8.0*  < > 6.6*  MG  --   < > 2.2  AST 31  --   --   ALT 23  --   --   ALKPHOS 93  --   --   BILITOT 1.5*  --   --   < > = values in this interval not displayed. Cardiac Enzymes  Recent Labs Lab 01/31/17 1730  TROPONINI 0.03*   RADIOLOGY:  No results  found. ASSESSMENT AND PLAN:   Miguel [name]  is a 83 y.o. male who presents with weakness, mildly increased confusion, A. fib with RVR. Patient's history is given mostly by his wife is a patient is unable to contribute very clearly to his own history of present illness. Wife states that he got chemotherapy earlier this week. She states he typically will get somewhat weak after chemotherapy, but this time he has gotten much weaker than normal. He also is more confused than normal  *General weakness - likely due to deconditioning, his chemotherapy, profound dehydration, poor by mouth intake.  Received IVF  *  Atrial fibrillation with RVR (HCC)  -metoprolol 25 mg tid--changed to toprol XL 50 mg bid --Echo 05/2016 showed EF 55% - no anticoagulation given falls at home in the past  *Neutropenia due to chemo for Stage IV Squamous cell esophageal cancer [facility])  - oncology consult appreciated -pt is neutropenic due to recent chemo -no fever -received Neupogen 300 mcg qd -wbc 0.6--0.9--Neupogen--1.4--neupogen-- 13.4. Hold neupogen  *  DM type 2 with diabetic peripheral neuropathy (HCC) - siding scale insulin with corresponding glucose checks  *  COPD (chronic obstructive pulmonary disease) (HCC)  *  CKD (chronic kidney disease), stage  III ([place]) - avoid nephrotoxins  Wife and son met with Palliative care and pt is now COMFORT MEASURES ONLY   CODE STATUS: DNR   TOTAL TIME TAKING CARE OF THIS PATIENT: *20* minutes.  >50% time spent on counselling and coordination of care    Note: This dictation was prepared with Dragon dictation along with smaller phrase technology. Any transcriptional errors that result from this process are unintentional.  Jahnai Slingerland M.D on 02/06/2017 at 2:11 PM  Between 7am to 6pm - Pager - [phone]  After 6pm go to [email or web address] - password [email or web address] [facility]  Office  [phone]  CC: Primary care physician; Leone Haven, MD

## 2017-02-06 NOTE — Progress Notes (Signed)
Palliative Medicine RN Note: Afternoon symptom check. Pt is up on [facility] with nurse tech. He says "I'm ok." He is still waiting on hospice bed. He is unsafe to move without assistance and cannot be left on [facility] without staff present and near enough to support him in case he becomes weak.  Marjie Skiff Emanuelle Bastos, RN, BSN, [facility] 02/06/2017 2:56 PM Cell [phone] 8:00-4:00 Monday-Friday Office [phone]

## 2017-02-06 NOTE — Telephone Encounter (Signed)
Patient currently admitted at this time. 

## 2017-02-06 NOTE — Progress Notes (Signed)
Follow up visit made to new hospice home referral. Patient seen lying in bed, awake and alert, oriented to self and family, wife at bedside. Patient able to talk about where he lives, did not know the name of the hospital where he. Some increased work of breathing noted with conversation. Patient and his wife also reported back and knee pain. Mrs. Tagle and patient agreeable to a dose of Tylenol, she had repositioned him prior to writer's visit with some relief. Plan continues for transfer to the hospice home when bed available. Hospital care team aware that is currently no bed availability. Will continue to follow and provide support through final disposition.  Flo Shanks RN, BSN, [place] [facility] [place], [facility] [phone]

## 2017-02-06 NOTE — Progress Notes (Signed)
Palliative Medicine RN Note: Checked on pt. He did not get pain meds overnight. He is awake but confused and has trouble following conversation at times. Wife is at bedside. I expressed concern that he was hurting but didn't get interventions; both he and wife say he is having no pain and had a good night.  Plan for PMT to follow up this afternoon to ensure symptoms remain controlled. He is at high risk for rapid escalation of pain due to ca, as well as for rapid escalation of dyspnea due to COPD; he requires RN supervision to monitor for advancing symptoms.   Marjie Skiff Sundae Maners, RN, BSN, [facility] 02/06/2017 9:45 AM Cell [phone] 8:00-4:00 Monday-Friday Office [phone]

## 2017-02-06 NOTE — Telephone Encounter (Signed)
-----   Message from Blain Pais sent at 02/06/2017  4:02 PM EDT ----- Regarding: tcm/ph 02/17/2017 2:30 Miguel Faith, PA

## 2017-02-06 NOTE — Telephone Encounter (Signed)
Patients son states the patient is currently in hospice

## 2017-02-07 MED ORDER — TEMAZEPAM 15 MG PO CAPS
15.0000 mg | ORAL_CAPSULE | Freq: Every day | ORAL | Status: DC
  Administered 2017-02-07: 22:00:00 15 mg via ORAL
  Filled 2017-02-07: qty 1

## 2017-02-07 MED ORDER — MORPHINE SULFATE (PF) 2 MG/ML IV SOLN: 1.0000 mg | Freq: Four times a day (QID) | INTRAVENOUS | Status: DC

## 2017-02-07 MED ORDER — LORAZEPAM 2 MG/ML IJ SOLN
0.5000 mg | INTRAMUSCULAR | Status: DC | PRN
  Administered 2017-02-07 (×2): 0.5 mg via INTRAVENOUS
  Filled 2017-02-07 (×2): qty 1

## 2017-02-07 MED ORDER — MORPHINE SULFATE (PF) 2 MG/ML IV SOLN
1.0000 mg | Freq: Four times a day (QID) | INTRAVENOUS | Status: DC | PRN
  Administered 2017-02-07 – 2017-02-08 (×3): 2 mg via INTRAVENOUS
  Filled 2017-02-07 (×3): qty 1

## 2017-02-07 MED ORDER — FENTANYL 50 MCG/HR TD PT72
50.0000 ug | MEDICATED_PATCH | TRANSDERMAL | Status: DC
  Administered 2017-02-07: 11:00:00 50 ug via TRANSDERMAL
  Filled 2017-02-07: qty 1

## 2017-02-07 NOTE — Progress Notes (Signed)
LCSW called [facility] Weekend person Suzi Roots [phone] and she reports patient remains on wait list and LCSW will call again at 1 pm today to see if this changes.   [employer] LCSW [phone]

## 2017-02-07 NOTE — Progress Notes (Signed)
[place] at [place] NAME: Miguel [name]    MR#:  [id number]  DATE OF BIRTH:  01-May-1929  SUBJECTIVE:   More awake today Son in the room REVIEW OF SYSTEMS:   Review of Systems  Unable to perform ROS: Mental status change   Tolerating Diet:very little   DRUG ALLERGIES:  No Known Allergies  VITALS:  Blood pressure 116/67, pulse 89, temperature 97.9 F (36.6 C), temperature source Oral, resp. rate (!) 23, height 6\' 1"  (1.854 m), weight 107.4 kg (236 lb 11.2 oz), SpO2 96 %.  PHYSICAL EXAMINATION:   Physical Exam  GENERAL:  83 y.o.-year-old patient lying in the bed with no acute distress. Weak, ill appearing EYES: Pupils equal, round, reactive to light and accommodation. No scleral icterus. Extraocular muscles intact.  HEENT: Head atraumatic, normocephalic, oral thrush NECK:  Supple, no jugular venous distention. No thyroid enlargement, no tenderness.  LUNGS: Normal breath sounds bilaterally, no wheezing, rales, rhonchi. No use of accessory muscles of respiration.  CARDIOVASCULAR: S1, S2 normal. No murmurs, rubs, or gallops.  ABDOMEN: Soft, nontender, nondistended. Bowel sounds present. No organomegaly or mass.  EXTREMITIES: No cyanosis, clubbing or edema b/l.    NEUROLOGIC: unable to assess  PSYCHIATRIC:  patient is sleepy    LABORATORY PANEL:  CBC  Recent Labs Lab 02/04/17 0925  WBC 13.2*  HGB 10.4*  HCT 31.6*  PLT 183    Chemistries   Recent Labs Lab 01/31/17 1730  02/05/17 0423  NA 137  < > 141  K 4.1  < > 4.4  CL 104  < > 109  CO2 24  < > 25  GLUCOSE 141*  < > 129*  BUN 25*  < > 32*  CREATININE 1.42*  < > 1.46*  CALCIUM 8.0*  < > 6.6*  MG  --   < > 2.2  AST 31  --   --   ALT 23  --   --   ALKPHOS 93  --   --   BILITOT 1.5*  --   --   < > = values in this interval not displayed. Cardiac Enzymes  Recent Labs Lab 01/31/17 1730  TROPONINI 0.03*   RADIOLOGY:  No results found. ASSESSMENT AND  PLAN:   Miguel [name]  is a 83 y.o. male who presents with weakness, mildly increased confusion, A. fib with RVR. Patient's history is given mostly by his wife is a patient is unable to contribute very clearly to his own history of present illness. Wife states that he got chemotherapy earlier this week. She states he typically will get somewhat weak after chemotherapy, but this time he has gotten much weaker than normal. He also is more confused than normal  *General weakness - likely due to deconditioning, his chemotherapy, profound dehydration, poor by mouth intake.  Received IVF  *  Atrial fibrillation with RVR (HCC)  -metoprolol 25 mg tid--changed to toprol XL 50 mg bid --Echo 05/2016 showed EF 55% - no anticoagulation given falls at home in the past  *Neutropenia due to chemo for Stage IV Squamous cell esophageal cancer [facility])  - oncology consult appreciated -pt is neutropenic due to recent chemo -no fever -received Neupogen 300 mcg qd -wbc 0.6--0.9--Neupogen--1.4--neupogen-- 13.4. Hold neupogen  *  DM type 2 with diabetic peripheral neuropathy (HCC) - siding scale insulin with corresponding glucose checks  *  COPD (chronic obstructive pulmonary disease) (HCC)  *  CKD (chronic kidney disease), stage III (  [place]) - avoid nephrotoxins  pt is now COMFORT MEASURES ONLY. Awaiting Hospice bed to open up   CODE STATUS: DNR   TOTAL TIME TAKING CARE OF THIS PATIENT: *20* minutes.  >50% time spent on counselling and coordination of care    Note: This dictation was prepared with Dragon dictation along with smaller phrase technology. Any transcriptional errors that result from this process are unintentional.  Lasonja Lakins M.D on 02/07/2017 at 12:24 PM  Between 7am to 6pm - Pager - [phone]  After 6pm go to [email or web address] - password [email or web address] [place] [facility]  Office  [phone]  CC: Primary care physician; Leone Haven, MD

## 2017-02-08 MED ORDER — FENTANYL 50 MCG/HR TD PT72: 50.0000 ug | MEDICATED_PATCH | TRANSDERMAL | 0 refills | Status: AC

## 2017-02-08 MED ORDER — MORPHINE SULFATE (CONCENTRATE) 10 MG/0.5ML PO SOLN: 10.0000 mg | ORAL | 0 refills | Status: AC | PRN

## 2017-02-08 MED ORDER — GLUCERNA SHAKE PO LIQD: 237.0000 mL | Freq: Three times a day (TID) | ORAL | 0 refills | Status: AC

## 2017-02-08 MED ORDER — MORPHINE SULFATE (CONCENTRATE) 10 MG/0.5ML PO SOLN: 10.0000 mg | ORAL | Status: DC | PRN

## 2017-02-08 NOTE — Clinical Social Work Note (Addendum)
CSW received call from RN admissions coordinator from [facility] to begin discharge process for this patient. CSW has alerted the attending MD of need for the discharge summary. CSW will deliver the packet and update the family once the discharge summary is received by the facility. The patient will travel by non-emergent EMS no earlier than 11:15 am. CSW will continue to follow to facilitate discharge.  CSW has delivered the packet and sent the discharge summary to the facility. CSW is signing off. Please recontact should any additional needs arise.   Santiago Bumpers, MSW, [facility] [phone]

## 2017-02-08 NOTE — Discharge Summary (Signed)
[facility] NAME: Miguel [name]    MR#:  [id number]  DATE OF BIRTH:  06-Jun-1929  DATE OF ADMISSION:  01/31/2017 ADMITTING PHYSICIAN: Lance Coon, MD  DATE OF DISCHARGE: 02/08/2017  PRIMARY CARE PHYSICIAN: Leone Haven, MD    ADMISSION DIAGNOSIS:  Chemotherapy-induced neutropenia ([place]) [D70.1, T45.1X5A] Atrial fibrillation with rapid ventricular response (HCC) [I48.91] Generalized weakness [R53.1] Confusion with non-focal neuro exam [R41.0]  DISCHARGE DIAGNOSIS:   Failure to Thrive  SECONDARY DIAGNOSIS:   Past Medical History:  Diagnosis Date  . Alcoholism ([place])   . Anxiety   . COPD (chronic obstructive pulmonary disease) ([place])   . Dementia 03/18/2016  . DM type 2 with diabetic peripheral neuropathy ([place]) 03/18/2016  . Dyspnea    slight   . Esophageal cancer ([place])   . Esophageal stricture   . GERD (gastroesophageal reflux disease) 03/18/2016  . Hyperlipidemia   . Kidney carcinoma ([place])    right, sp ablation  . Macular degeneration   . Neuropathy   . Persistent atrial fibrillation (HCC)    a. not on full dose anticoagulation given comorbidities; b. CHADS2VASc => 3 (age x 2, DM)  . Renal mass     HOSPITAL COURSE:  JohnJerniganis a 83 y.o.malewho presents with weakness, mildly increased confusion, A. fib with RVR. Patient's history is given mostly by his wife is a patient is unable to contribute very clearly to his own history of present illness. Wife states that he got chemotherapy earlier this week. She states he typically will get somewhat weak after chemotherapy, but this time he has gotten much weaker than normal. He also is more confused than normal  *General weakness - likely due to deconditioning, his chemotherapy, profound dehydration, poor by mouth intake.  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 Hold neupogen  *DM type 2 with diabetic peripheral neuropathy (HCC) - siding scale insulin with corresponding glucose checks  *COPD (chronic obstructive pulmonary disease) (HCC)  *CKD (chronic kidney disease), stage III (HCC) - avoid nephrotoxins  pt is now COMFORT MEASURES ONLY.  Hospice bed has  opened up--d/c today  CONSULTS OBTAINED:  Treatment Team:  Sindy Guadeloupe, MD  DRUG ALLERGIES:  No Known Allergies  DISCHARGE MEDICATIONS:   Current Discharge Medication List    START taking these medications   Details  feeding supplement, GLUCERNA SHAKE, (GLUCERNA SHAKE) LIQD Take 237 mLs by mouth 3 (three) times daily between meals. Qty: 30 Can, Refills: 0    fentaNYL (DURAGESIC - DOSED MCG/HR) 50 MCG/HR Place 1 patch (50 mcg total) onto the skin every 3 (three) days. Qty: 5 patch, Refills: 0    Morphine Sulfate (MORPHINE CONCENTRATE) 10 MG/0.5ML SOLN concentrated solution Take 0.5 mLs (10 mg total) by mouth every 2 (two) hours as needed for moderate pain or severe pain. Qty: 180 mL, Refills: 0      CONTINUE these medications which have NOT CHANGED   Details  ALPRAZolam (XANAX) 0.5 MG tablet Take 1 tablet (0.5 mg total) by mouth 3 (three) times daily as needed. for anxiety Qty: 90 tablet, Refills: 0  ondansetron (ZOFRAN) 8 MG tablet Take 1 tablet (8 mg total) by mouth 2 (two) times daily as needed (Nausea or vomiting). Qty: 30 tablet, Refills: 3   Associated Diagnoses: Squamous cell esophageal cancer (HCC)    prochlorperazine (COMPAZINE) 10 MG tablet Take 1 tablet (10 mg total) by mouth every 6 (six) hours as needed (Nausea or vomiting). Qty: 30 tablet, Refills: 3    Associated Diagnoses: Squamous cell esophageal cancer (HCC)    QUEtiapine (SEROQUEL) 50 MG tablet Take 1 tablet (50 mg total) by mouth at bedtime. Qty: 90 tablet, Refills: 1      STOP taking these medications     ADVAIR DISKUS 250-50 MCG/DOSE AEPB      aspirin 81 MG chewable tablet      atorvastatin (LIPITOR) 40 MG tablet      donepezil (ARICEPT) 10 MG tablet      fesoterodine (TOVIAZ) 8 MG TB24 tablet      furosemide (LASIX) 20 MG tablet      glucose blood test strip      ketoconazole (NIZORAL) 2 % cream      lidocaine-prilocaine (EMLA) cream      megestrol (MEGACE) 40 MG tablet      metFORMIN (GLUCOPHAGE) 1000 MG tablet      metoprolol tartrate (LOPRESSOR) 25 MG tablet      Multiple Vitamins-Minerals (CENTRUM SILVER PO)      omeprazole (PRILOSEC) 20 MG capsule      polyethylene glycol (MIRALAX / GLYCOLAX) packet         If you experience worsening of your admission symptoms, develop shortness of breath, life threatening emergency, suicidal or homicidal thoughts you must seek medical attention immediately by calling 911 or calling your MD immediately  if symptoms less severe.  You Must read complete instructions/literature along with all the possible adverse reactions/side effects for all the Medicines you take and that have been prescribed to you. Take any new Medicines after you have completely understood and accept all the possible adverse reactions/side effects.   Please note  You were cared for by a hospitalist during your hospital stay. If you have any questions about your discharge medications or the care you received while you were in the hospital after you are discharged, you can call the unit and asked to speak with the hospitalist on call if the hospitalist that took care of you is not available. Once you are discharged, your primary care physician will handle any further medical issues. Please note that NO REFILLS for any discharge medications will be  authorized once you are discharged, as it is imperative that you return to your primary care physician (or establish a relationship with a primary care physician if you do not have one) for your aftercare needs so that they can reassess your need for medications and monitor your lab values. Today   SUBJECTIVE     VITAL SIGNS:  Blood pressure 125/64, pulse 97, temperature 98.4 F (36.9 C), resp. rate 20, height 6\' 1"  (1.854 m), weight 107.4 kg (236 lb 11.2 oz), SpO2 94 %.  I/O:    Intake/Output Summary (Last 24 hours) at 02/08/17 1058 Last data filed at 02/08/17 1026  Gross per 24 hour  Intake              480 ml  Output                0 ml  Net  480 ml    PHYSICAL EXAMINATION:  GENERAL:  83 y.o.-year-old patient lying in the bed with no acute distress.  EYES: Pupils equal, round, reactive to light and accommodation. No scleral icterus. Extraocular muscles intact.  HEENT: Head atraumatic, normocephalic. Oropharynx and nasopharynx clear.  NECK:  Supple, no jugular venous distention. No thyroid enlargement, no tenderness.  LUNGS: Normal breath sounds bilaterally, no wheezing, rales,rhonchi or crepitation. No use of accessory muscles of respiration.  CARDIOVASCULAR: S1, S2 normal. No murmurs, rubs, or gallops.  ABDOMEN: Soft, non-tender, non-distended. Bowel sounds present. No organomegaly or mass.  EXTREMITIES: No pedal edema, cyanosis, or clubbing.  NEUROLOGIC: Cranial nerves II through XII are intact. Muscle strength 5/5 in all extremities. Sensation intact. Gait not checked.  PSYCHIATRIC: The patient is alert and oriented x 3.  SKIN: No obvious rash, lesion, or ulcer.   DATA REVIEW:   CBC   Recent Labs Lab 02/04/17 0925  WBC 13.2*  HGB 10.4*  HCT 31.6*  PLT 183    Chemistries   Recent Labs Lab 02/05/17 0423  NA 141  K 4.4  CL 109  CO2 25  GLUCOSE 129*  BUN 32*  CREATININE 1.46*  CALCIUM 6.6*  MG 2.2    Microbiology Results   Recent Results  (from the past 240 hour(s))  Urine culture     Status: None   Collection Time: 01/31/17  5:30 PM  Result Value Ref Range Status   Specimen Description URINE, CLEAN CATCH  Final   Special Requests Normal  Final   Culture   Final    NO GROWTH Performed at [facility], [place] [address], [place],  [address]    Report Status 02/02/2017 FINAL  Final  Blood Culture (routine x 2)     Status: None   Collection Time: 01/31/17  5:31 PM  Result Value Ref Range Status   Specimen Description BLOOD LEFT ANTECUBITAL  Final   Special Requests   Final    BOTTLES DRAWN AEROBIC AND ANAEROBIC Blood Culture adequate volume   Culture NO GROWTH 5 DAYS  Final   Report Status 02/05/2017 FINAL  Final  Blood Culture (routine x 2)     Status: None   Collection Time: 01/31/17  5:31 PM  Result Value Ref Range Status   Specimen Description BLOOD RIGHT ANTECUBITAL  Final   Special Requests   Final    BOTTLES DRAWN AEROBIC AND ANAEROBIC Blood Culture adequate volume   Culture NO GROWTH 5 DAYS  Final   Report Status 02/05/2017 FINAL  Final  C difficile quick scan w PCR reflex     Status: None   Collection Time: 02/02/17  1:47 AM  Result Value Ref Range Status   C Diff antigen NEGATIVE NEGATIVE Final   C Diff toxin NEGATIVE NEGATIVE Final   C Diff interpretation No C. difficile detected.  Final  Gastrointestinal Panel by PCR , Stool     Status: None   Collection Time: 02/02/17  1:47 AM  Result Value Ref Range Status   Campylobacter species NOT DETECTED NOT DETECTED Final   Plesimonas shigelloides NOT DETECTED NOT DETECTED Final   Salmonella species NOT DETECTED NOT DETECTED Final   Yersinia enterocolitica NOT DETECTED NOT DETECTED Final   Vibrio species NOT DETECTED NOT DETECTED Final   Vibrio cholerae NOT DETECTED NOT DETECTED Final   Enteroaggregative E coli (EAEC) NOT DETECTED NOT DETECTED Final   Enteropathogenic E coli (EPEC) NOT DETECTED NOT DETECTED Final   Enterotoxigenic E  coli (ETEC)  NOT DETECTED NOT DETECTED Final   Shiga like toxin producing E coli (STEC) NOT DETECTED NOT DETECTED Final   Shigella/Enteroinvasive E coli (EIEC) NOT DETECTED NOT DETECTED Final   Cryptosporidium NOT DETECTED NOT DETECTED Final   Cyclospora cayetanensis NOT DETECTED NOT DETECTED Final   Entamoeba histolytica NOT DETECTED NOT DETECTED Final   Giardia lamblia NOT DETECTED NOT DETECTED Final   Adenovirus F40/41 NOT DETECTED NOT DETECTED Final   Astrovirus NOT DETECTED NOT DETECTED Final   Norovirus GI/GII NOT DETECTED NOT DETECTED Final   Rotavirus A NOT DETECTED NOT DETECTED Final   Sapovirus (I, II, IV, and V) NOT DETECTED NOT DETECTED Final    RADIOLOGY:  No results found.   Management plans discussed with the patient, family and they are in agreement.  CODE STATUS:     Code Status Orders        Start     Ordered   02/05/17 2010  Do not attempt resuscitation (DNR)  Continuous    Question Answer Comment  In the event of cardiac or respiratory ARREST Do not call a "code blue"   In the event of cardiac or respiratory ARREST Do not perform Intubation, CPR, defibrillation or ACLS   In the event of cardiac or respiratory ARREST Use medication by any route, position, wound care, and other measures to relive pain and suffering. May use oxygen, suction and manual treatment of airway obstruction as needed for comfort.      02/05/17 2010    Code Status History    Date Active Date Inactive Code Status Order ID Comments User Context   02/04/2017  5:38 PM 02/05/2017  8:10 PM DNR [id number]  Barbette Or, RN Inpatient   01/31/2017 10:54 PM 02/04/2017  5:38 PM Full Code [id number]  Lance Coon, MD Inpatient   06/03/2016  3:33 PM 06/06/2016  6:46 PM Full Code [id number]  Dustin Flock, MD Inpatient      TOTAL TIME TAKING CARE OF THIS PATIENT: 40 minutes.    Kenia Teagarden M.D on 02/08/2017 at 10:58 AM  Between 7am to 6pm - Pager - [phone] After 6pm go to [email or web address] -  password [email or web address] [facility]  Office  [phone]  CC: Primary care physician; Leone Haven, MD

## 2017-02-08 NOTE — Progress Notes (Signed)
October 27th LCSW called and spoke to [place] at [facility] and  they reported that if patient can transport in am on October 28th they have a bed. LCSW to call and firm up plan with Deb and family. LCSW updated handoff .  Addilee Neu LCSW

## 2017-02-08 NOTE — Progress Notes (Signed)
Pt is being discharged to [facility] home. Report given to Debbi, RN. Discharge papers given and explained to pt and family. Family verbalized understanding. Copy of AVS placed in discharge packet along with RX. Awaiting EMS.

## 2017-02-09 ENCOUNTER — Telehealth: Payer: Self-pay | Admitting: *Deleted

## 2017-02-09 ENCOUNTER — Ambulatory Visit: Payer: Medicare Other | Admitting: Gastroenterology

## 2017-02-09 ENCOUNTER — Encounter: Payer: Self-pay | Admitting: *Deleted

## 2017-02-09 NOTE — Telephone Encounter (Signed)
Letter ready, Ulice Dash notified and will pick at up today.

## 2017-02-09 NOTE — Telephone Encounter (Signed)
Patient's son [name] called to request a work excuse letter for 02/05/2017. Patient was in the hospital on that date and he stayed at bedside. He can be reached at [phone] he is at [facility] today.

## 2017-02-09 NOTE — Telephone Encounter (Signed)
Absolutely.

## 2017-02-10 ENCOUNTER — Other Ambulatory Visit: Payer: Medicare Other

## 2017-02-10 ENCOUNTER — Ambulatory Visit: Payer: Medicare Other

## 2017-02-10 ENCOUNTER — Ambulatory Visit: Payer: Medicare Other | Admitting: Oncology

## 2017-02-10 NOTE — Telephone Encounter (Signed)
Discontinued by DR.Posey Pronto

## 2017-02-10 NOTE — Telephone Encounter (Signed)
S/w wife. Patient is currently with Hospice care. She said he would not be able to make this appointment. I advised I would cancel his upcoming appointment and to call us if they have any concerns regarding cardiology in the future. Patient was scheduled to see Christell Faith on 02/17/17. Patient of Dr End. Routing to Dr. Saunders Revel to make him aware.

## 2017-02-12 DEATH — deceased

## 2017-02-17 ENCOUNTER — Ambulatory Visit: Payer: Medicare Other | Admitting: Physician Assistant

## 2017-03-04 ENCOUNTER — Inpatient Hospital Stay: Payer: Medicare Other

## 2017-03-04 ENCOUNTER — Ambulatory Visit: Payer: Medicare Other | Admitting: Oncology

## 2017-03-10 ENCOUNTER — Ambulatory Visit: Payer: Medicare Other | Admitting: Oncology

## 2017-03-14 DEATH — deceased

## 2017-03-18 ENCOUNTER — Other Ambulatory Visit: Payer: Self-pay | Admitting: Nurse Practitioner

## 2017-04-30 ENCOUNTER — Ambulatory Visit: Payer: Medicare Other | Admitting: Podiatry

## 2017-06-17 ENCOUNTER — Ambulatory Visit: Payer: Medicare Other | Admitting: Family Medicine

## 2018-02-25 IMAGING — CR DG CHEST 1V PORT
1 series · 2 of 2 positions shown · non-contrast
Comparison: CT from earlier the same day

CLINICAL DATA: S/p left thoracentesis

EXAM:
PORTABLE CHEST - 1 VIEW

[Series 1: dg chest port 1 view · 0.14mm/px · 2 of 2 slices shown]
[im 1/2]
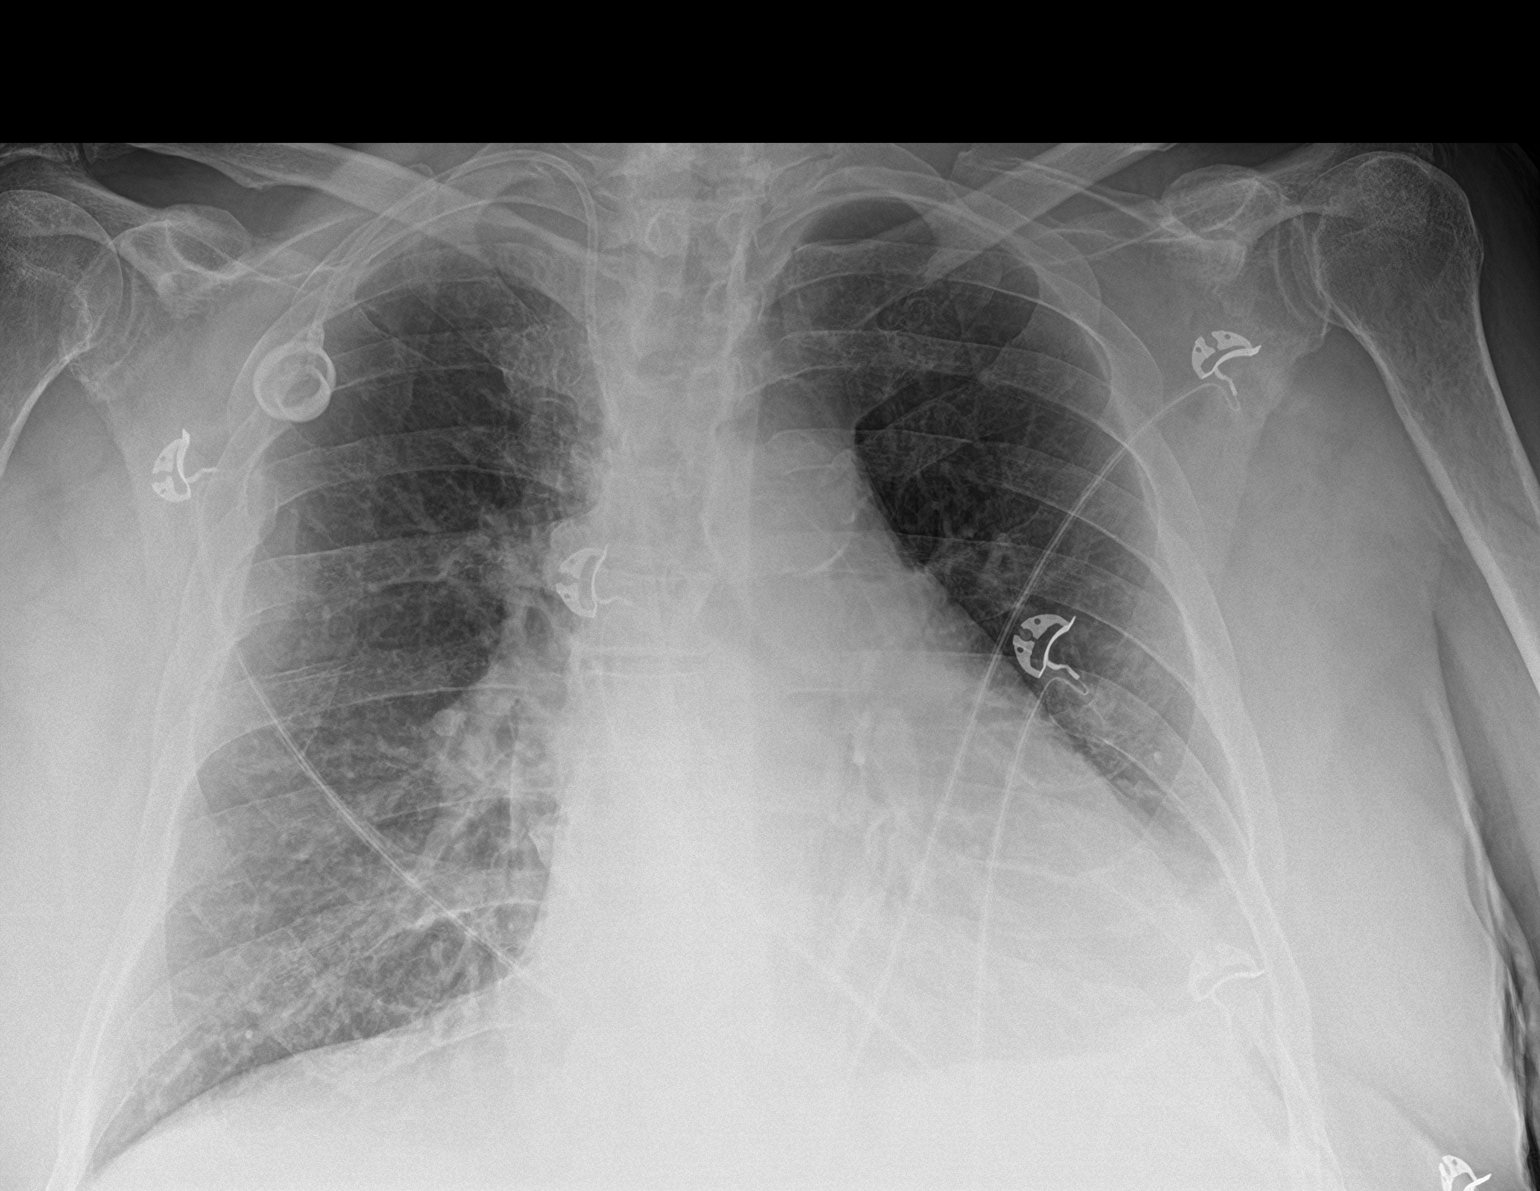
[im 2/2]
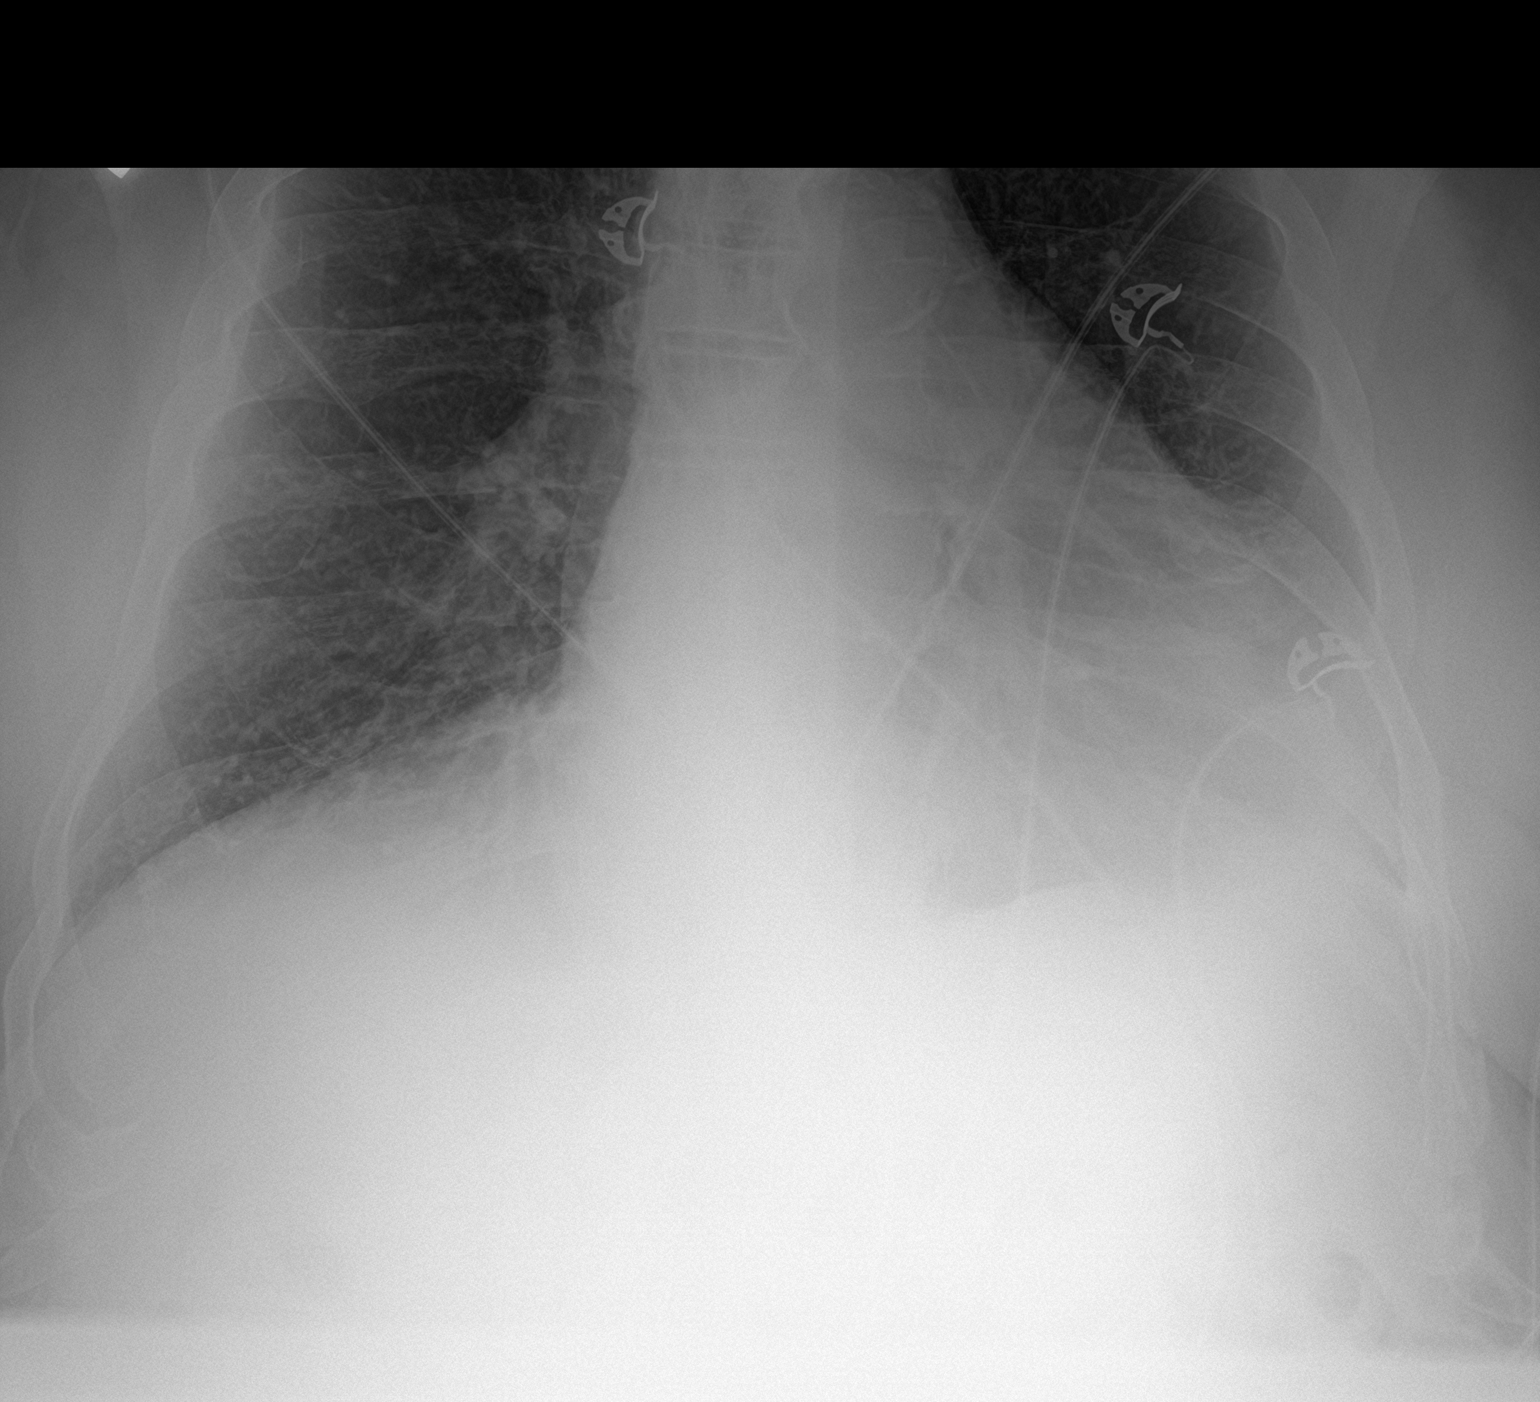

[2 of 2 positions shown; findings below may reference images not displayed]

FINDINGS: No pneumothorax. Persisting consolidation/atelectasis laterally at
the left lung base. Right lung clear.

Heart size upper limits normal for technique.  Atheromatous aorta.

Can not exclude small residual left pleural effusion. Stable right
IJ port catheter to the cavoatrial junction.

Visualized bones unremarkable.
IMPRESSION: 1. No pneumothorax post left thoracentesis.

## 2018-02-25 IMAGING — CT CT ANGIO CHEST
1 of 6 series · 19 of 36 positions shown · IV contrast (APPLIED)
Comparison: 05/12/2016.

CLINICAL DATA: 86-year-old diabetic male with esophageal cancer and
dementia presenting with abdominal pain for the past 2 days.
Right-sided chest pain. Atrial fibrillation. Initial encounter.

EXAM:
CT ANGIOGRAPHY CHEST WITH CONTRAST
TECHNIQUE: Multidetector CT imaging of the chest was performed using the
standard protocol during bolus administration of intravenous
contrast. Multiplanar CT image reconstructions and MIPs were
obtained to evaluate the vascular anatomy.
CONTRAST:  60 cc Isovue 370.

[Series 6: thins · axial · 0.76mm/px · z∈[-342,-68]mm · 19 of 306 slices shown]
[im 16/306  lung]
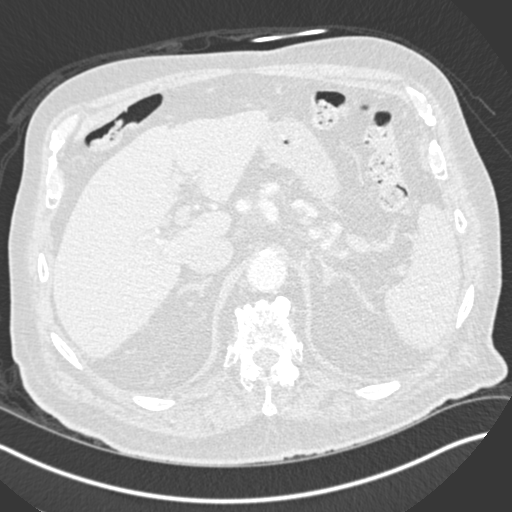
[im 31/306  mediastinal]
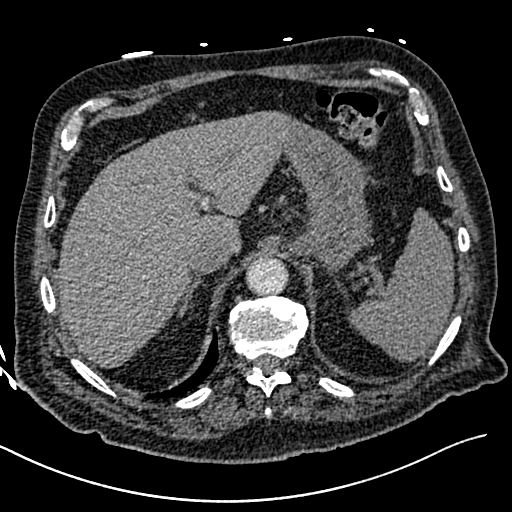
[im 46/306  lung]
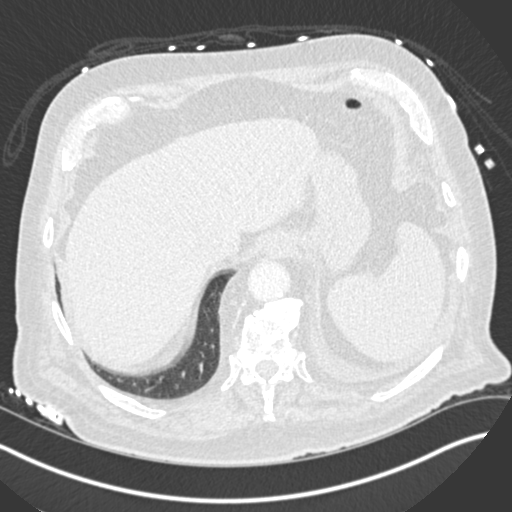
[im 62/306  mediastinal]
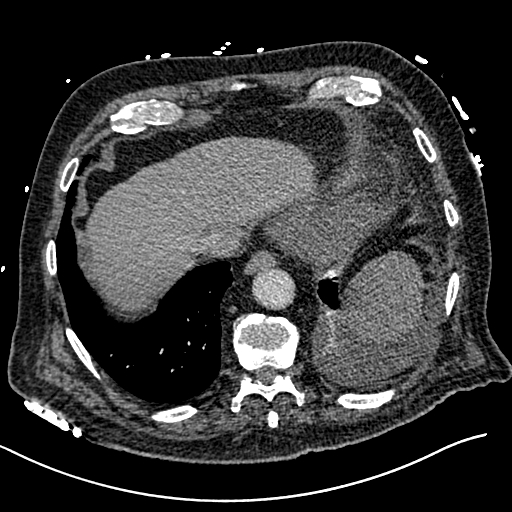
[im 77/306  lung]
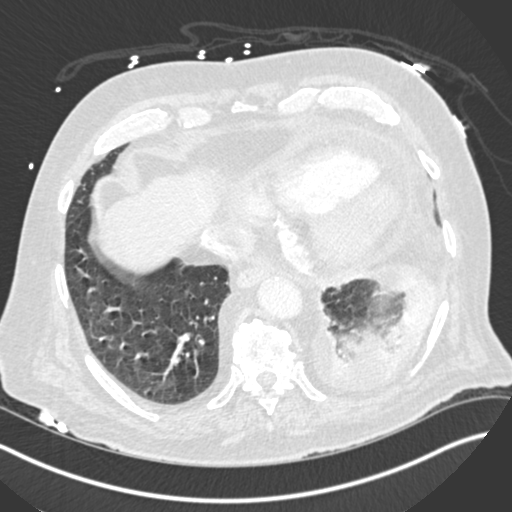
[im 92/306  mediastinal]
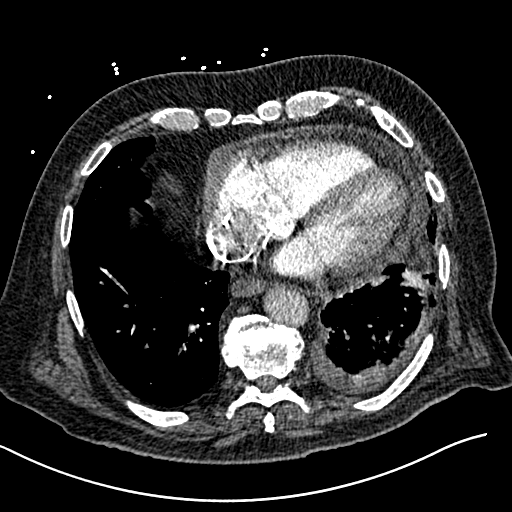
[im 107/306  lung]
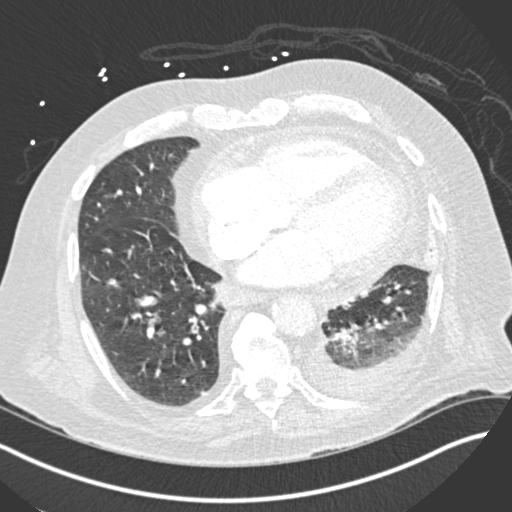
[im 123/306  mediastinal]
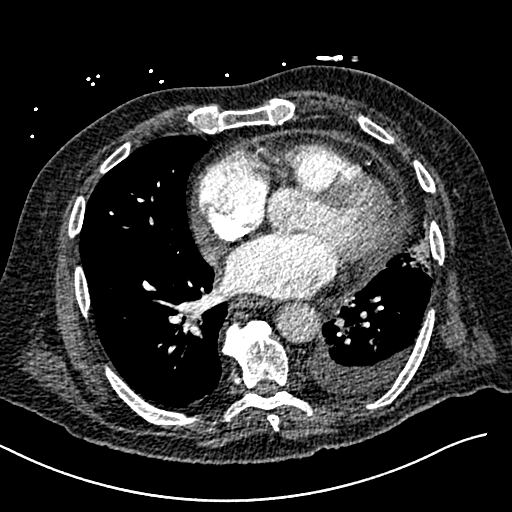
[im 138/306  lung]
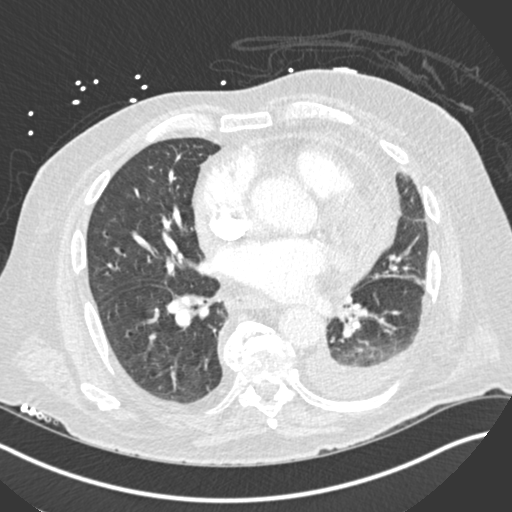
[im 153/306  mediastinal]
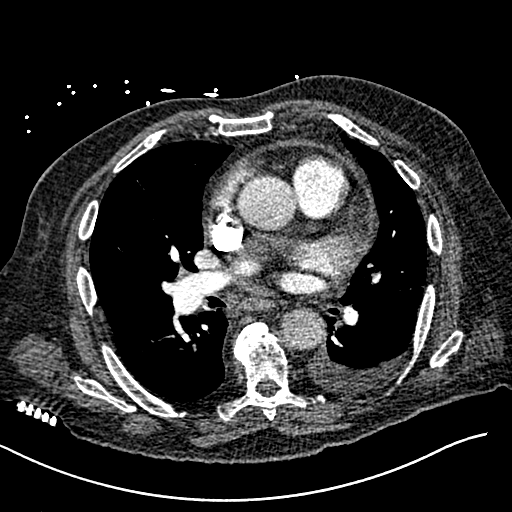
[im 168/306  lung]
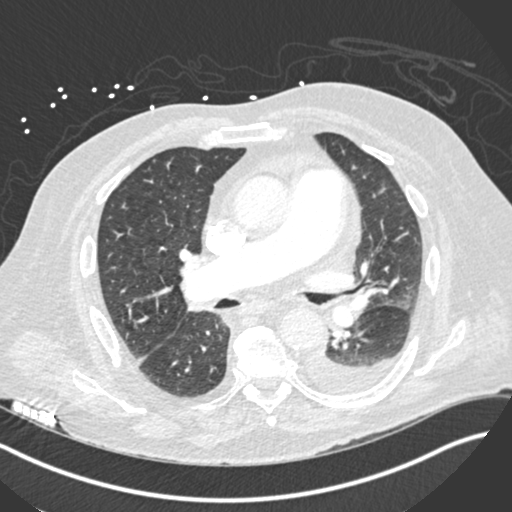
[im 184/306  mediastinal]
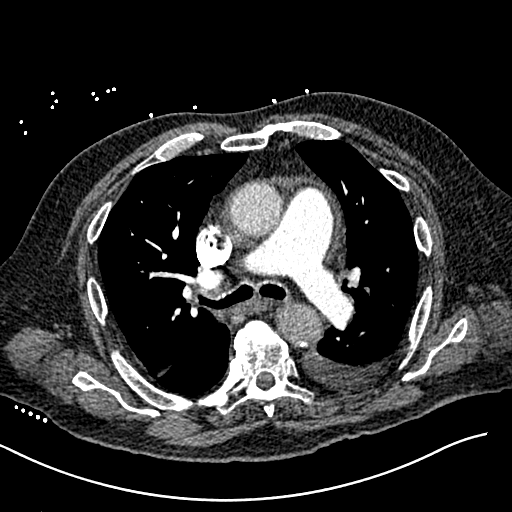
[im 199/306  lung]
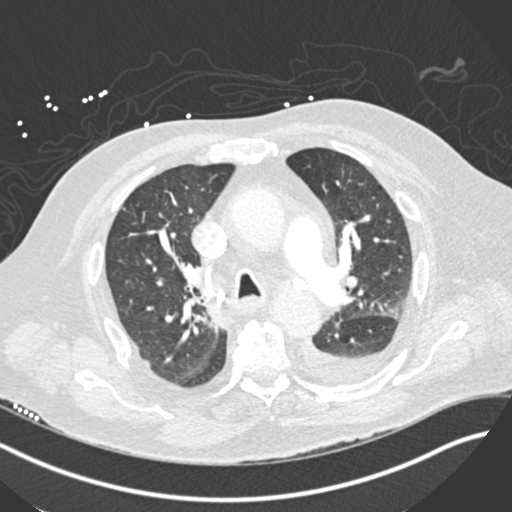
[im 214/306  mediastinal]
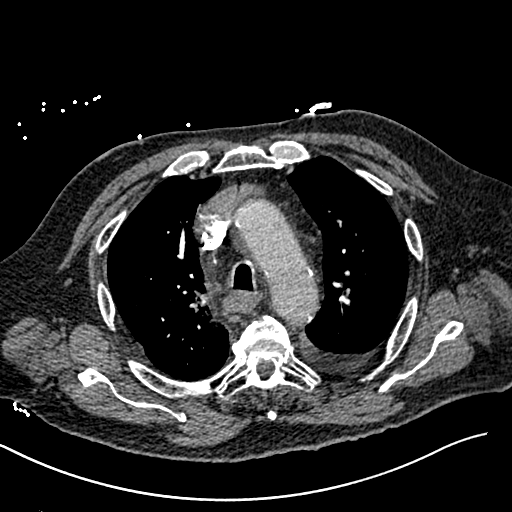
[im 229/306  lung]
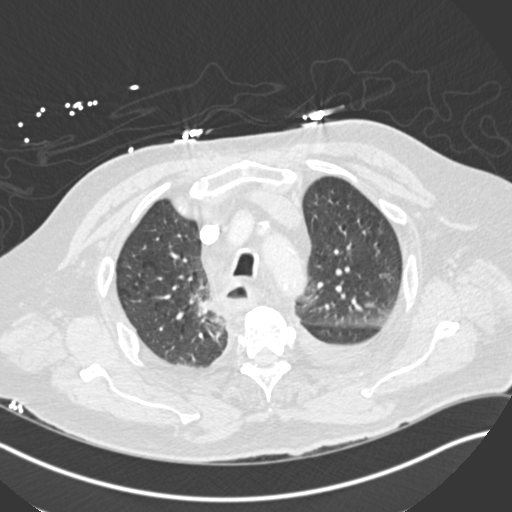
[im 245/306  mediastinal]
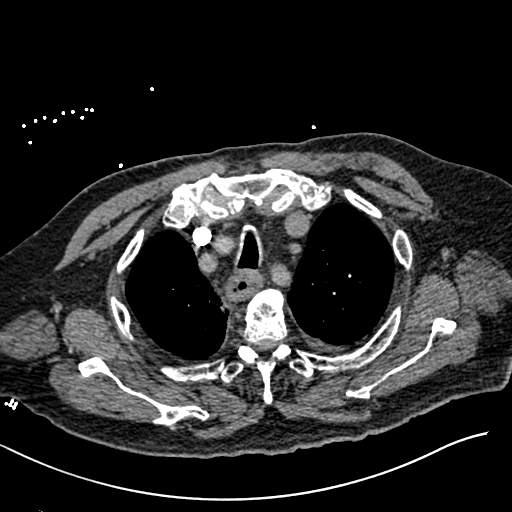
[im 260/306  lung]
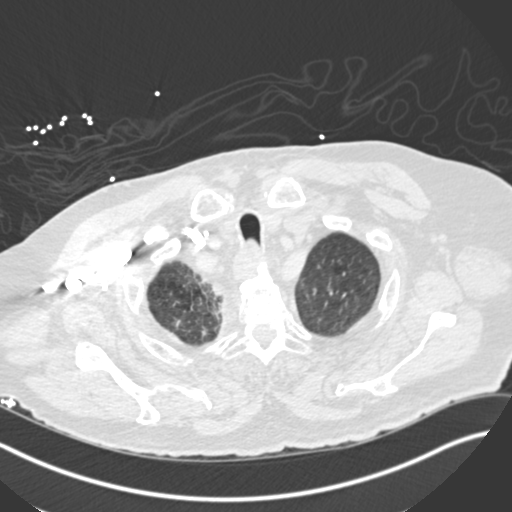
[im 275/306  mediastinal]
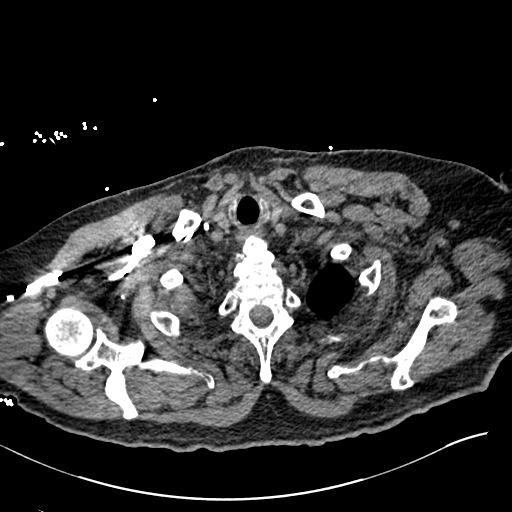
[im 290/306  lung]
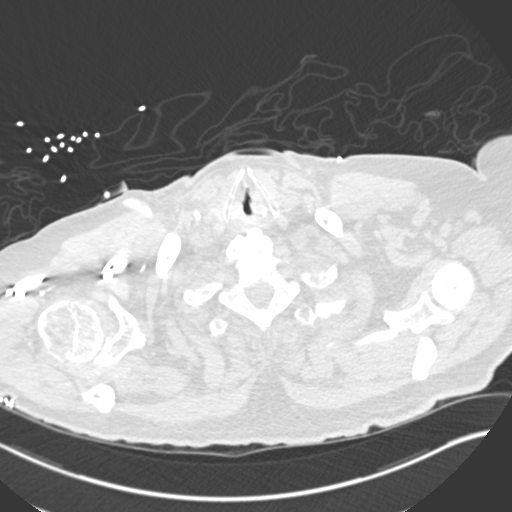

[19 of 36 positions shown; findings below may reference images not displayed]

FINDINGS: Cardiovascular: No pulmonary embolus or aortic dissection. Right
central line tip right atrium. Non-opacified blood adjacent to the
central line probably represents mixing of non-opacified blood
rather than thrombus.

Cardiomegaly. Small pericardial effusion/ pericardial thickening.
Coronary artery calcifications.

Mediastinum/Nodes: Esophageal wall thickening (most notable superior
thoracic esophagus) similar to prior exam. No adenopathy.

Lungs/Pleura: Interval development of small to moderate-size
left-sided pleural effusion. Adjacent passive atelectasis. Post
therapy changes medial aspect right lung.

Upper Abdomen: No acute abnormality. Right renal mass noted on prior
exam not of imaged on the current exam.

Musculoskeletal: Thoracic kyphosis with flowing confluent anterior
osteophyte without destructive lesion.

Review of the MIP images confirms the above findings.
IMPRESSION: No pulmonary embolus or aortic dissection.

Interval development of small to moderate-size left-sided pleural
effusion. Adjacent passive atelectasis. Post therapy changes medial
aspect right lung.

Esophageal wall thickening (most notable superior thoracic
esophagus) similar to prior exam.

Cardiomegaly. Small pericardial effusion/pericardial thickening.
Coronary artery calcifications.

## 2018-02-27 IMAGING — DX DG CHEST 1V PORT
1 series · 1 of 1 positions shown · non-contrast
Comparison: 04/02/2017 and earlier.

CLINICAL DATA: 86-year-old male with abdominal pain and fever.
Status post left thoracentesis on 06/03/2016. Personal history of
esophageal cancer. Initial encounter.

EXAM:
PORTABLE CHEST 1 VIEW

[chest ap]
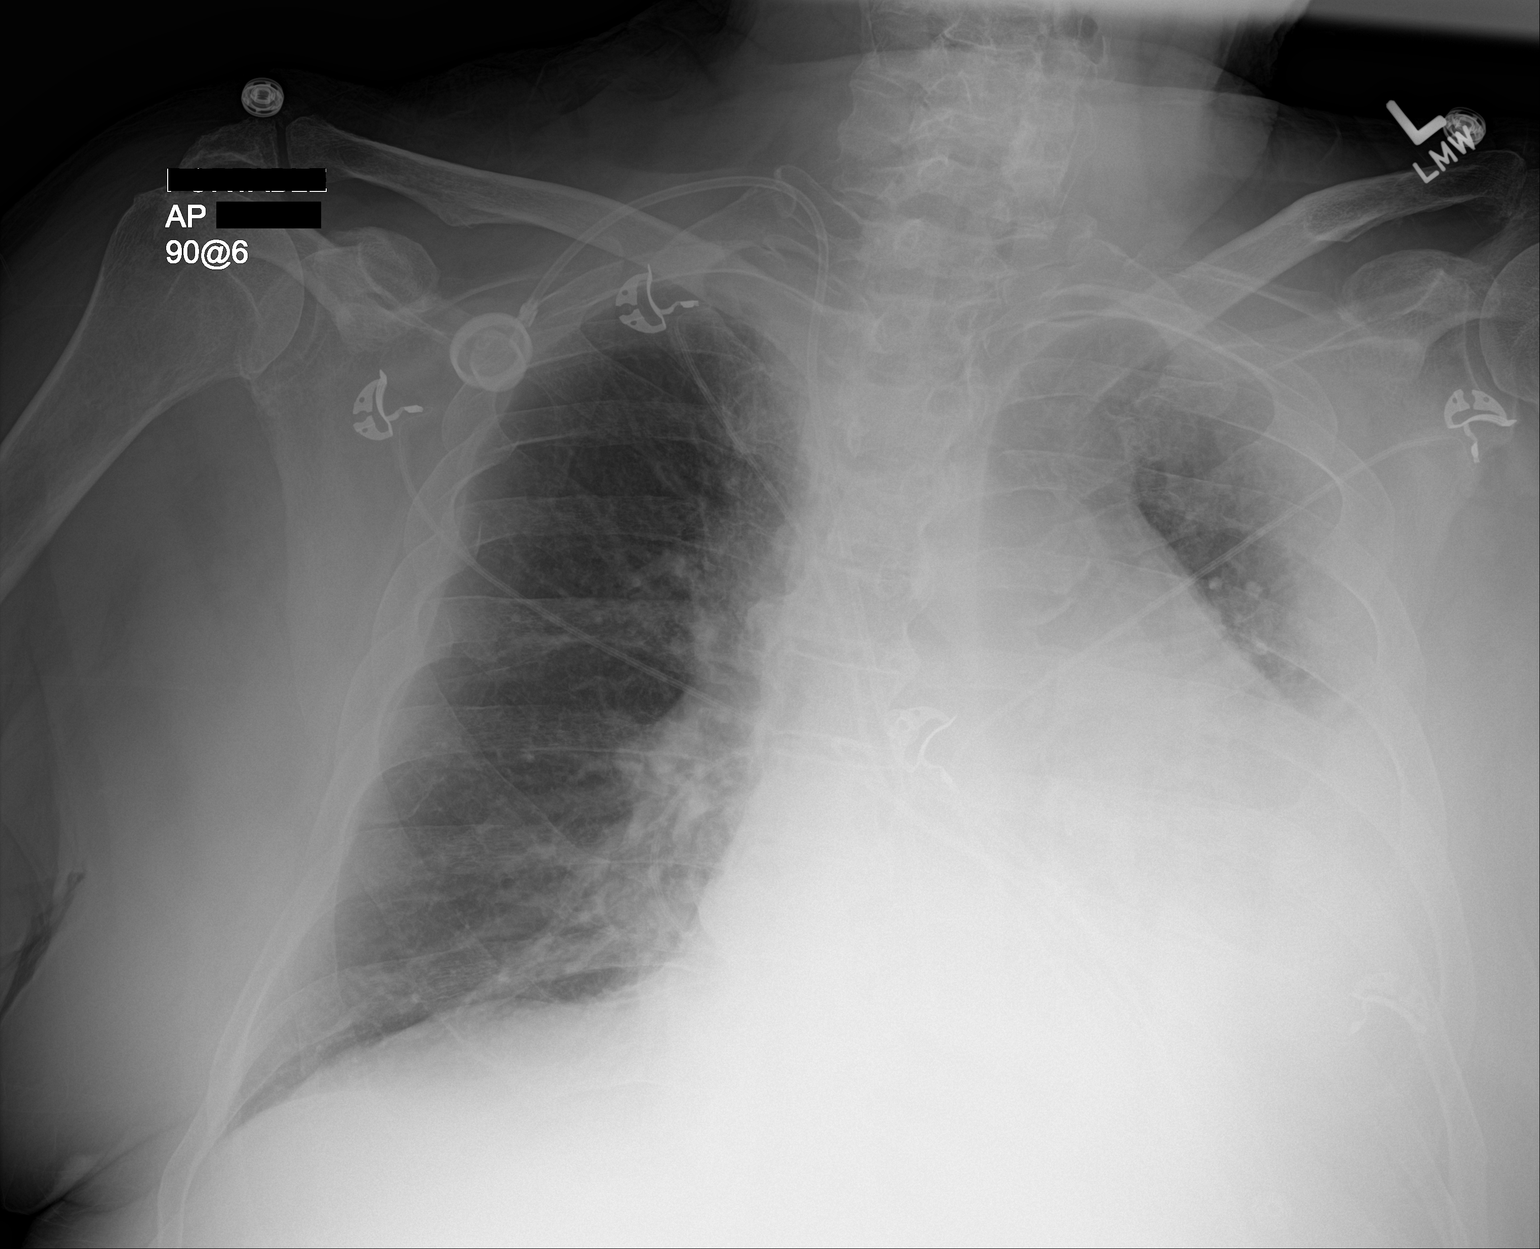

[1 of 1 positions shown; findings below may reference images not displayed]

FINDINGS: Portable AP upright view at 5848 hours. Progressed veiling opacity
in the left lung since the post thoracentesis images. At the same
time there appears to be some volume loss on the left, with slight
leftward shift of the mediastinum. The right lung remains clear
allowing for portable technique. Stable visible mediastinal
contours. Calcified aortic atherosclerosis. Stable right chest porta
cath. Chronic right lateral rib fractures.
IMPRESSION: 1. Increasing veiling opacity and probably also volume loss in the
left lung since the thoracentesis on 06/03/2016. Suspect some
re-accumulation of left pleural effusion.
2. The right lung remains clear.
